# Patient Record
Sex: Female | Born: 1935 | Race: White | Hispanic: No | Marital: Single | Smoking: Never smoker
Health system: Southern US, Community
[De-identification: ages and names within clinical notes are randomized; demographics above are authoritative.]

## PROBLEM LIST (undated history)

## (undated) DIAGNOSIS — I10 Essential (primary) hypertension: Secondary | ICD-10-CM

## (undated) DIAGNOSIS — C801 Malignant (primary) neoplasm, unspecified: Secondary | ICD-10-CM

## (undated) DIAGNOSIS — Q211 Atrial septal defect: Secondary | ICD-10-CM

## (undated) DIAGNOSIS — J449 Chronic obstructive pulmonary disease, unspecified: Secondary | ICD-10-CM

## (undated) DIAGNOSIS — Q2112 Patent foramen ovale: Secondary | ICD-10-CM

## (undated) DIAGNOSIS — L570 Actinic keratosis: Secondary | ICD-10-CM

## (undated) DIAGNOSIS — I509 Heart failure, unspecified: Secondary | ICD-10-CM

## (undated) DIAGNOSIS — Z8489 Family history of other specified conditions: Secondary | ICD-10-CM

## (undated) DIAGNOSIS — Z452 Encounter for adjustment and management of vascular access device: Secondary | ICD-10-CM

## (undated) HISTORY — PX: ABDOMINAL HYSTERECTOMY: SHX81

## (undated) HISTORY — PX: JOINT REPLACEMENT: SHX530

## (undated) HISTORY — DX: Actinic keratosis: L57.0

---

## 2003-10-08 ENCOUNTER — Other Ambulatory Visit: Payer: Self-pay

## 2003-11-23 ENCOUNTER — Other Ambulatory Visit: Payer: Self-pay

## 2003-11-24 ENCOUNTER — Other Ambulatory Visit: Payer: Self-pay

## 2004-05-28 ENCOUNTER — Other Ambulatory Visit: Payer: Self-pay

## 2005-07-23 ENCOUNTER — Ambulatory Visit: Payer: Self-pay | Admitting: Ophthalmology

## 2005-07-29 ENCOUNTER — Ambulatory Visit: Payer: Self-pay | Admitting: Ophthalmology

## 2005-12-23 ENCOUNTER — Emergency Department: Payer: Self-pay | Admitting: Emergency Medicine

## 2006-01-04 ENCOUNTER — Inpatient Hospital Stay: Payer: Self-pay | Admitting: Internal Medicine

## 2006-01-04 ENCOUNTER — Other Ambulatory Visit: Payer: Self-pay

## 2006-05-27 ENCOUNTER — Ambulatory Visit: Payer: Self-pay | Admitting: Specialist

## 2006-06-03 ENCOUNTER — Inpatient Hospital Stay: Payer: Self-pay | Admitting: Specialist

## 2006-06-03 ENCOUNTER — Other Ambulatory Visit: Payer: Self-pay

## 2006-06-26 ENCOUNTER — Emergency Department: Payer: Self-pay | Admitting: Emergency Medicine

## 2006-06-26 ENCOUNTER — Other Ambulatory Visit: Payer: Self-pay

## 2006-09-29 ENCOUNTER — Emergency Department: Payer: Self-pay | Admitting: Emergency Medicine

## 2006-11-03 ENCOUNTER — Ambulatory Visit: Payer: Self-pay | Admitting: Specialist

## 2007-03-12 ENCOUNTER — Other Ambulatory Visit: Payer: Self-pay

## 2007-03-12 ENCOUNTER — Emergency Department: Payer: Self-pay | Admitting: Emergency Medicine

## 2007-12-04 ENCOUNTER — Other Ambulatory Visit: Payer: Self-pay

## 2007-12-04 ENCOUNTER — Observation Stay: Payer: Self-pay | Admitting: Internal Medicine

## 2008-07-01 ENCOUNTER — Emergency Department: Payer: Self-pay | Admitting: Emergency Medicine

## 2009-03-07 ENCOUNTER — Ambulatory Visit: Payer: Self-pay | Admitting: Specialist

## 2009-04-22 ENCOUNTER — Ambulatory Visit: Payer: Self-pay | Admitting: General Practice

## 2009-06-17 ENCOUNTER — Ambulatory Visit: Payer: Self-pay | Admitting: General Practice

## 2009-07-03 ENCOUNTER — Inpatient Hospital Stay: Payer: Self-pay | Admitting: General Practice

## 2009-07-09 ENCOUNTER — Encounter: Payer: Self-pay | Admitting: Internal Medicine

## 2009-07-15 ENCOUNTER — Encounter: Payer: Self-pay | Admitting: Internal Medicine

## 2010-07-15 ENCOUNTER — Ambulatory Visit: Payer: Self-pay | Admitting: General Practice

## 2010-07-30 ENCOUNTER — Inpatient Hospital Stay: Payer: Self-pay | Admitting: General Practice

## 2010-10-02 ENCOUNTER — Ambulatory Visit: Payer: Self-pay | Admitting: Gastroenterology

## 2010-10-04 LAB — PATHOLOGY REPORT

## 2010-12-09 ENCOUNTER — Ambulatory Visit: Payer: Self-pay | Admitting: General Practice

## 2010-12-22 ENCOUNTER — Inpatient Hospital Stay: Payer: Self-pay | Admitting: General Practice

## 2010-12-22 DIAGNOSIS — I10 Essential (primary) hypertension: Secondary | ICD-10-CM

## 2010-12-26 ENCOUNTER — Encounter: Payer: Self-pay | Admitting: Internal Medicine

## 2011-01-13 ENCOUNTER — Encounter: Payer: Self-pay | Admitting: Internal Medicine

## 2011-09-10 ENCOUNTER — Ambulatory Visit: Payer: Self-pay | Admitting: Sports Medicine

## 2011-11-25 ENCOUNTER — Inpatient Hospital Stay: Payer: Self-pay | Admitting: Internal Medicine

## 2011-11-25 LAB — CBC
HCT: 38.2 % (ref 35.0–47.0)
MCH: 30 pg (ref 26.0–34.0)
MCHC: 32.4 g/dL (ref 32.0–36.0)
MCV: 93 fL (ref 80–100)
Platelet: 165 10*3/uL (ref 150–440)
RBC: 4.13 10*6/uL (ref 3.80–5.20)
RDW: 13.2 % (ref 11.5–14.5)
WBC: 7.1 10*3/uL (ref 3.6–11.0)

## 2011-11-25 LAB — COMPREHENSIVE METABOLIC PANEL
Alkaline Phosphatase: 83 U/L (ref 50–136)
Anion Gap: 11 (ref 7–16)
BUN: 13 mg/dL (ref 7–18)
Bilirubin,Total: 0.3 mg/dL (ref 0.2–1.0)
Calcium, Total: 8.7 mg/dL (ref 8.5–10.1)
EGFR (African American): >60
EGFR (Non-African Amer.): >60
Glucose: 118 mg/dL — ABNORMAL HIGH (ref 65–99)
Osmolality: 269 (ref 275–301)
Potassium: 3.9 mmol/L (ref 3.5–5.1)
SGOT(AST): 28 U/L (ref 15–37)
SGPT (ALT): 27 U/L

## 2011-11-25 LAB — PRO B NATRIURETIC PEPTIDE: B-Type Natriuretic Peptide: 140 pg/mL (ref 0–450)

## 2011-11-26 LAB — CBC WITH DIFFERENTIAL/PLATELET
Basophil #: 0 10*3/uL (ref 0.0–0.1)
Basophil %: 0.2 %
Eosinophil %: 0 %
Lymphocyte #: 0.6 10*3/uL — ABNORMAL LOW (ref 1.0–3.6)
Lymphocyte %: 6.5 %
MCHC: 33 g/dL (ref 32.0–36.0)
MCV: 93 fL (ref 80–100)
Monocyte %: 4.6 %
Neutrophil #: 8.7 10*3/uL — ABNORMAL HIGH (ref 1.4–6.5)
Platelet: 201 10*3/uL (ref 150–440)
RBC: 4.17 10*6/uL (ref 3.80–5.20)
RDW: 14 % (ref 11.5–14.5)
WBC: 9.8 10*3/uL (ref 3.6–11.0)

## 2011-11-26 LAB — BASIC METABOLIC PANEL
BUN: 18 mg/dL (ref 7–18)
Calcium, Total: 8.6 mg/dL (ref 8.5–10.1)
Creatinine: 1.03 mg/dL (ref 0.60–1.30)
EGFR (African American): >60
EGFR (Non-African Amer.): 54 — ABNORMAL LOW
Glucose: 191 mg/dL — ABNORMAL HIGH (ref 65–99)
Osmolality: 277 (ref 275–301)
Sodium: 135 mmol/L — ABNORMAL LOW (ref 136–145)

## 2011-11-27 ENCOUNTER — Inpatient Hospital Stay: Payer: Self-pay | Admitting: Student

## 2011-11-27 LAB — CBC
HCT: 37 % (ref 35.0–47.0)
HGB: 12.5 g/dL (ref 12.0–16.0)
MCH: 31.3 pg (ref 26.0–34.0)
RBC: 4 10*6/uL (ref 3.80–5.20)
RDW: 14.1 % (ref 11.5–14.5)
WBC: 14.6 10*3/uL — ABNORMAL HIGH (ref 3.6–11.0)

## 2011-11-27 LAB — BASIC METABOLIC PANEL
Anion Gap: 11 (ref 7–16)
Calcium, Total: 8.4 mg/dL — ABNORMAL LOW (ref 8.5–10.1)
Chloride: 95 mmol/L — ABNORMAL LOW (ref 98–107)
Co2: 26 mmol/L (ref 21–32)
EGFR (African American): >60
EGFR (Non-African Amer.): 54 — ABNORMAL LOW
Glucose: 122 mg/dL — ABNORMAL HIGH (ref 65–99)
Osmolality: 271 (ref 275–301)

## 2011-11-27 LAB — TROPONIN I: Troponin-I: <0.02 ng/mL

## 2011-11-28 LAB — CBC WITH DIFFERENTIAL/PLATELET
Basophil #: 0 x10 3/mm 3
Basophil %: 0.1 %
Eosinophil #: 0 x10 3/mm 3
Eosinophil %: 0.1 %
HCT: 37.8 %
HGB: 12.4 g/dL
Lymphocyte %: 8.7 %
Lymphs Abs: 1 x10 3/mm 3
MCH: 30.9 pg
MCHC: 32.8 g/dL
MCV: 94 fL
Monocyte #: 0.9 x10 3/mm 3 — ABNORMAL HIGH
Monocyte %: 7.2 %
Neutrophil #: 10 x10 3/mm 3 — ABNORMAL HIGH
Neutrophil %: 83.9 %
Platelet: 198 x10 3/mm 3
RBC: 4.02 X10 6/mm 3
RDW: 14.1 %
WBC: 11.9 x10 3/mm 3 — ABNORMAL HIGH

## 2011-11-28 LAB — COMPREHENSIVE METABOLIC PANEL
Albumin: 2.7 g/dL — ABNORMAL LOW (ref 3.4–5.0)
Anion Gap: 9 (ref 7–16)
Calcium, Total: 8.5 mg/dL (ref 8.5–10.1)
Chloride: 97 mmol/L — ABNORMAL LOW (ref 98–107)
Co2: 29 mmol/L (ref 21–32)
EGFR (Non-African Amer.): 51 — ABNORMAL LOW
Glucose: 90 mg/dL (ref 65–99)
Osmolality: 274 (ref 275–301)
Potassium: 3.5 mmol/L (ref 3.5–5.1)
SGOT(AST): 24 U/L (ref 15–37)
SGPT (ALT): 25 U/L

## 2011-11-30 LAB — HEMOGLOBIN: HGB: 11.7 g/dL — ABNORMAL LOW (ref 12.0–16.0)

## 2011-12-01 LAB — CBC WITH DIFFERENTIAL/PLATELET
Eosinophil #: 0 10*3/uL (ref 0.0–0.7)
Eosinophil %: 0 %
HCT: 33.4 % — ABNORMAL LOW (ref 35.0–47.0)
Lymphocyte #: 0.9 10*3/uL — ABNORMAL LOW (ref 1.0–3.6)
MCH: 30.9 pg (ref 26.0–34.0)
MCV: 93 fL (ref 80–100)
Monocyte %: 6.6 %
Neutrophil #: 9.3 10*3/uL — ABNORMAL HIGH (ref 1.4–6.5)
Neutrophil %: 85.5 %
RBC: 3.6 10*6/uL — ABNORMAL LOW (ref 3.80–5.20)
RDW: 14.1 % (ref 11.5–14.5)
WBC: 10.9 10*3/uL (ref 3.6–11.0)

## 2011-12-02 LAB — CBC WITH DIFFERENTIAL/PLATELET
Basophil %: 0 %
Eosinophil #: 0 10*3/uL (ref 0.0–0.7)
HCT: 35.6 % (ref 35.0–47.0)
Lymphocyte #: 1.1 10*3/uL (ref 1.0–3.6)
MCH: 30.3 pg (ref 26.0–34.0)
MCHC: 32.7 g/dL (ref 32.0–36.0)
MCV: 93 fL (ref 80–100)
Monocyte #: 0.9 10*3/uL — ABNORMAL HIGH (ref 0.0–0.7)
Neutrophil #: 11 10*3/uL — ABNORMAL HIGH (ref 1.4–6.5)
Platelet: 226 10*3/uL (ref 150–440)
RBC: 3.84 10*6/uL (ref 3.80–5.20)
RDW: 14.1 % (ref 11.5–14.5)

## 2011-12-02 LAB — OCCULT BLOOD X 1 CARD TO LAB, STOOL: Occult Blood, Feces: NEGATIVE

## 2011-12-02 LAB — CLOSTRIDIUM DIFFICILE BY PCR

## 2011-12-03 LAB — CULTURE, BLOOD (SINGLE)

## 2011-12-04 LAB — STOOL CULTURE

## 2013-11-30 DIAGNOSIS — IMO0002 Reserved for concepts with insufficient information to code with codable children: Secondary | ICD-10-CM | POA: Insufficient documentation

## 2013-11-30 DIAGNOSIS — M545 Low back pain, unspecified: Secondary | ICD-10-CM | POA: Insufficient documentation

## 2013-11-30 DIAGNOSIS — M1610 Unilateral primary osteoarthritis, unspecified hip: Secondary | ICD-10-CM | POA: Insufficient documentation

## 2014-01-14 ENCOUNTER — Observation Stay: Payer: Self-pay | Admitting: Internal Medicine

## 2014-01-14 LAB — PROTIME-INR
INR: 1
Prothrombin Time: 13.5 secs (ref 11.5–14.7)

## 2014-01-14 LAB — PRO B NATRIURETIC PEPTIDE: B-Type Natriuretic Peptide: 89 pg/mL (ref 0–450)

## 2014-01-14 LAB — BASIC METABOLIC PANEL
Anion Gap: 10 (ref 7–16)
BUN: 17 mg/dL (ref 7–18)
CREATININE: 1.13 mg/dL (ref 0.60–1.30)
Calcium, Total: 8.9 mg/dL (ref 8.5–10.1)
Chloride: 102 mmol/L (ref 98–107)
Co2: 25 mmol/L (ref 21–32)
EGFR (African American): 51 — ABNORMAL LOW
EGFR (Non-African Amer.): 44 — ABNORMAL LOW
Glucose: 145 mg/dL — ABNORMAL HIGH (ref 65–99)
OSMOLALITY: 278 (ref 275–301)
Potassium: 3.5 mmol/L (ref 3.5–5.1)
Sodium: 137 mmol/L (ref 136–145)

## 2014-01-14 LAB — CK TOTAL AND CKMB (NOT AT ARMC)
CK, Total: 92 U/L
CK-MB: 0.7 ng/mL (ref 0.5–3.6)

## 2014-01-14 LAB — CBC
HCT: 37.1 % (ref 35.0–47.0)
HGB: 12.1 g/dL (ref 12.0–16.0)
MCH: 27.7 pg (ref 26.0–34.0)
MCHC: 32.7 g/dL (ref 32.0–36.0)
MCV: 85 fL (ref 80–100)
Platelet: 183 10*3/uL (ref 150–440)
RBC: 4.37 10*6/uL (ref 3.80–5.20)
RDW: 15.3 % — ABNORMAL HIGH (ref 11.5–14.5)
WBC: 6.9 10*3/uL (ref 3.6–11.0)

## 2014-01-14 LAB — TROPONIN I: Troponin-I: <0.02 ng/mL

## 2014-01-15 LAB — BASIC METABOLIC PANEL
ANION GAP: 11 (ref 7–16)
BUN: 17 mg/dL (ref 7–18)
CALCIUM: 8.8 mg/dL (ref 8.5–10.1)
Chloride: 100 mmol/L (ref 98–107)
Co2: 23 mmol/L (ref 21–32)
Creatinine: 1.16 mg/dL (ref 0.60–1.30)
EGFR (African American): 49 — ABNORMAL LOW
EGFR (Non-African Amer.): 43 — ABNORMAL LOW
Glucose: 256 mg/dL — ABNORMAL HIGH (ref 65–99)
OSMOLALITY: 279 (ref 275–301)
POTASSIUM: 3.6 mmol/L (ref 3.5–5.1)
Sodium: 134 mmol/L — ABNORMAL LOW (ref 136–145)

## 2014-01-17 ENCOUNTER — Inpatient Hospital Stay: Payer: Self-pay | Admitting: Internal Medicine

## 2014-01-17 LAB — COMPREHENSIVE METABOLIC PANEL
ALBUMIN: 3.3 g/dL — AB (ref 3.4–5.0)
ANION GAP: 9 (ref 7–16)
AST: 98 U/L — AB (ref 15–37)
Alkaline Phosphatase: 90 U/L
BUN: 31 mg/dL — ABNORMAL HIGH (ref 7–18)
Bilirubin,Total: 0.3 mg/dL (ref 0.2–1.0)
CHLORIDE: 103 mmol/L (ref 98–107)
CREATININE: 1.14 mg/dL (ref 0.60–1.30)
Calcium, Total: 9 mg/dL (ref 8.5–10.1)
Co2: 23 mmol/L (ref 21–32)
EGFR (African American): 50 — ABNORMAL LOW
EGFR (Non-African Amer.): 44 — ABNORMAL LOW
Glucose: 185 mg/dL — ABNORMAL HIGH (ref 65–99)
OSMOLALITY: 281 (ref 275–301)
POTASSIUM: 4.2 mmol/L (ref 3.5–5.1)
SGPT (ALT): 45 U/L (ref 12–78)
Sodium: 135 mmol/L — ABNORMAL LOW (ref 136–145)
TOTAL PROTEIN: 7.1 g/dL (ref 6.4–8.2)

## 2014-01-17 LAB — CK: CK, Total: 118 U/L

## 2014-01-17 LAB — CBC
HCT: 38.6 % (ref 35.0–47.0)
HGB: 12.6 g/dL (ref 12.0–16.0)
MCH: 27.6 pg (ref 26.0–34.0)
MCHC: 32.7 g/dL (ref 32.0–36.0)
MCV: 85 fL (ref 80–100)
PLATELETS: 239 10*3/uL (ref 150–440)
RBC: 4.56 10*6/uL (ref 3.80–5.20)
RDW: 15.5 % — ABNORMAL HIGH (ref 11.5–14.5)
WBC: 8.8 10*3/uL (ref 3.6–11.0)

## 2014-01-17 LAB — URINALYSIS, COMPLETE
BACTERIA: NONE SEEN
Bilirubin,UR: NEGATIVE
Blood: NEGATIVE
GLUCOSE, UR: NEGATIVE mg/dL (ref 0–75)
Ketone: NEGATIVE
Leukocyte Esterase: NEGATIVE
Nitrite: NEGATIVE
PH: 6 (ref 4.5–8.0)
PROTEIN: NEGATIVE
RBC, UR: NONE SEEN /HPF (ref 0–5)
Specific Gravity: 1.016 (ref 1.003–1.030)
Squamous Epithelial: <1

## 2014-01-18 LAB — CBC WITH DIFFERENTIAL/PLATELET
Basophil #: 0 10*3/uL (ref 0.0–0.1)
Basophil %: 0.2 %
EOS PCT: 0.2 %
Eosinophil #: 0 10*3/uL (ref 0.0–0.7)
HCT: 36.6 % (ref 35.0–47.0)
HGB: 11.7 g/dL — ABNORMAL LOW (ref 12.0–16.0)
LYMPHS PCT: 19.8 %
Lymphocyte #: 1.6 10*3/uL (ref 1.0–3.6)
MCH: 26.7 pg (ref 26.0–34.0)
MCHC: 31.9 g/dL — AB (ref 32.0–36.0)
MCV: 84 fL (ref 80–100)
MONO ABS: 0.7 x10 3/mm (ref 0.2–0.9)
Monocyte %: 9.5 %
Neutrophil #: 5.5 10*3/uL (ref 1.4–6.5)
Neutrophil %: 70.3 %
Platelet: 196 10*3/uL (ref 150–440)
RBC: 4.37 10*6/uL (ref 3.80–5.20)
RDW: 15.5 % — AB (ref 11.5–14.5)
WBC: 7.9 10*3/uL (ref 3.6–11.0)

## 2014-01-18 LAB — COMPREHENSIVE METABOLIC PANEL
ALK PHOS: 69 U/L
ANION GAP: 7 (ref 7–16)
AST: 64 U/L — AB (ref 15–37)
Albumin: 2.9 g/dL — ABNORMAL LOW (ref 3.4–5.0)
BILIRUBIN TOTAL: 0.4 mg/dL (ref 0.2–1.0)
BUN: 22 mg/dL — ABNORMAL HIGH (ref 7–18)
CHLORIDE: 101 mmol/L (ref 98–107)
CO2: 27 mmol/L (ref 21–32)
Calcium, Total: 8.6 mg/dL (ref 8.5–10.1)
Creatinine: 1.02 mg/dL (ref 0.60–1.30)
EGFR (African American): 58 — ABNORMAL LOW
EGFR (Non-African Amer.): 50 — ABNORMAL LOW
GLUCOSE: 114 mg/dL — AB (ref 65–99)
Osmolality: 274 (ref 275–301)
POTASSIUM: 3.3 mmol/L — AB (ref 3.5–5.1)
SGPT (ALT): 40 U/L (ref 12–78)
SODIUM: 135 mmol/L — AB (ref 136–145)
Total Protein: 6.6 g/dL (ref 6.4–8.2)

## 2014-01-18 LAB — HEMOGLOBIN: HGB: 12.2 g/dL (ref 12.0–16.0)

## 2014-01-19 LAB — CULTURE, BLOOD (SINGLE)

## 2014-02-26 DIAGNOSIS — R0602 Shortness of breath: Secondary | ICD-10-CM | POA: Insufficient documentation

## 2014-07-02 DIAGNOSIS — I5032 Chronic diastolic (congestive) heart failure: Secondary | ICD-10-CM | POA: Insufficient documentation

## 2014-12-31 DIAGNOSIS — E782 Mixed hyperlipidemia: Secondary | ICD-10-CM | POA: Insufficient documentation

## 2015-01-03 ENCOUNTER — Emergency Department
Admit: 2015-01-03 | Disposition: A | Discharge status: Home / Self Care | Payer: Self-pay | Admitting: Emergency Medicine

## 2015-01-03 LAB — BASIC METABOLIC PANEL
Anion Gap: 5 — ABNORMAL LOW (ref 7–16)
BUN: 15 mg/dL
CALCIUM: 8.2 mg/dL — AB
CHLORIDE: 105 mmol/L
Co2: 24 mmol/L
Creatinine: 0.73 mg/dL
EGFR (African American): >60
EGFR (Non-African Amer.): >60
GLUCOSE: 146 mg/dL — AB
Potassium: 3.9 mmol/L
Sodium: 134 mmol/L — ABNORMAL LOW

## 2015-01-03 LAB — CBC
HCT: 34.9 % — ABNORMAL LOW (ref 35.0–47.0)
HGB: 10.9 g/dL — ABNORMAL LOW (ref 12.0–16.0)
MCH: 24.5 pg — ABNORMAL LOW (ref 26.0–34.0)
MCHC: 31.3 g/dL — ABNORMAL LOW (ref 32.0–36.0)
MCV: 78 fL — ABNORMAL LOW (ref 80–100)
PLATELETS: 150 10*3/uL (ref 150–440)
RBC: 4.46 10*6/uL (ref 3.80–5.20)
RDW: 16.7 % — AB (ref 11.5–14.5)
WBC: 5 10*3/uL (ref 3.6–11.0)

## 2015-01-03 LAB — TROPONIN I: Troponin-I: <0.03 ng/mL

## 2015-01-05 NOTE — Discharge Summary (Signed)
PATIENT NAME:  Brittney Wood, KUNST MR#:  [id number] DATE OF BIRTH:  12-17-1924  DATE OF ADMISSION:  01/14/2014 DATE OF DISCHARGE:  01/15/2014  ADMITTING DIAGNOSIS: Chronic obstructive pulmonary disease exacerbation.   DISCHARGE DIAGNOSES:  1. Chronic obstructive pulmonary disease exacerbation.  2. Acute bronchitis.  3. Hyperglycemia, with steroids. 4. Hyponatremia with IV fluids.  5. History of hypertension, hyperlipidemia, pulmonary fibrosis, osteoporosis, osteoarthritis, diastolic congestive heart failure, chronic, stable.  6.   History of glaucoma.   DISCHARGE CONDITION: Stable.   DISCHARGE MEDICATIONS: The patient is to continue: 1. Advair Diskus 250/50 one puff twice daily, amlodipine 5 mg p.o. daily.  2. Travatan 0.004% ophthalmic solution 1 drop to each eye at bedtime. 3.  Tylenol 650 mg every 8 hours as needed.  4. HCTZ 12.5 mg p.o. daily.  5. Aspirin 81 mg at p.o. bedtime. 6. Calcium with vitamin D 1 tablet once daily. 7. Omeprazole 20 mg p.o. daily.  8. Simvastatin 10 mg p.o. at bedtime. 9. HCTZ losartan 12.5/50 mg  once daily.  10. Potassium chloride 20 mg once daily.  11. Prednisone taper 50 mg p.o. once on 01/16/2014, then taper  by 10  mg until stopped.  12. Tiotropium  1 capsule inhalation daily.  13. Combivent Respimat 100/20, 1 puff 4 times daily as needed.  14. Montelukast 10 mg p.o. daily.  15. Levofloxacin 750 mg every 48 hours, 7 days total.  16. Multivitamins once daily.   HOME OXYGEN: None.   DIET: Two grams salt, low-fat, low-cholesterol, carbohydrate -controlled diet, regular consistency.   ACTIVITY LIMITATIONS: As tolerated.    FOLLOWUP APPOINTMENT: With Dr. Raul Del in 1 week after discharge.    CONSULTANTS: Care management, social work.   RADIOLOGIC STUDIES: Chest x-ray, PA and lateral, 01/14/2014, revealed no acute cardiopulmonary disease, hyperexpanded lung suggesting COPD. Repeated chest x-ray, PA and lateral, 01/15/2014, revealed lower bilateral  lung volumes with scattered bilateral atelectasis.   HOSPITAL COURSE: The patient is an 81 year old Caucasian female with past medical history significant for history of MRSA and sputum, history of pulmonary fibrosis, who presented to the hospital with complaints of shortness of breath. Please refer to Dr. Doy Hutching' admission note on 01/14/2014. On arrival to the hospital, the patient's vital signs, temperature was 98.2, pulse was 84, respiration rate was 20, blood pressure 114/63, saturation was 98% on 2 liters of oxygen through nasal cannula. Physical exam revealed scattered wheezes and decreased bilateral breath sounds.  Patient's lab data done in the Emergency Room on admission showed elevation of glucose to 145; otherwise, BMP was unremarkable. The patient's cardiac enzymes x 4 were within normal limits. CBC was within normal limits with normal white blood cell count of 6.9, hemoglobin 12.1, platelet count 183,000. Coagulation panel was unremarkable. Blood cultures taken on the same day, 01/14/2014, showed no growth. EKG showed normal sinus rhythm at 91 beats per minute, normal axis, no significant changes were noted. Chest x-ray was significant for COPD. Patient was admitted to the hospital for further evaluation. She was started on broad-spectrum antibiotics, including Zyvox as well as Levaquin and IV steroids and SVNs. She was also continued on oxygen therapy, and by the evening on 01/14/2014 she significantly improved. She was seen by Dr. Wallene Huh who felt that patient may be able to be discharged home the next day. By 01/15/2014, she was weaned down to room air and her oxygen saturations remained stable at 92%- 94% on room air at rest. She was advised to continue Levaquin. We were not able discharge  her on Zyvox since no sputum cultures were available and patient improved significantly and very quickly on current medications.  Patient was advised to continue inhalation therapy with Advair Diskus as  well as tiotropium, Combivent, montelukast. She was advised to continue antibiotic therapy to complete antibiotic course. She was at rest, to taper prednisone, and follow up with her primary care physician, Dr. Raul Del, in the next few days after discharge. In regard to acute bronchitis, we were not able to get the sputum cultures, and since she improved significantly with current medications, a short course, we felt that the patient would benefit from Levaquin alone.  Patient is to be reassessed by her primary care physician, Dr. Raul Del, and decisions will be made depending on her sputum cultures as well as overall well-being in regard to initiation on Zyvox or not as outpatient. The patient became slightly hyponatremic with IV fluids; however, IV fluids were discontinued. It is recommended to follow the patient's sodium level as outpatient to make sure that this stabilizes. For history of hypertension, hyperlipidemia, pulmonary fibrosis, osteoporosis, osteoarthritis, diastolic CHF, the patient is to continue her usual outpatient management. On the day of discharge, she felt satisfactory, did not complain of any significant discomfort. Her vitals were stable with temperature of 97.8, pulse 99, respiration rate was 18, blood pressure 107/62, saturation 93%-94% on room air at rest.   TIME SPENT: 40 minutes.    ____________________________ Theodoro Grist, MD rv:dd D: 01/15/2014 16:43:00 ET T: 01/16/2014 03:11:06 ET JOB#: [id number]  cc: Theodoro Grist, MD, <Dictator> Dr. Dionne Bucy MD ELECTRONICALLY SIGNED 02/01/2014 10:11

## 2015-01-05 NOTE — Consult Note (Signed)
Pt seen and examined. See Dawn Harrison's notes. Last rectal bleeding episode was yesterday AM. On clear liquid diet. No further bleeding. No abd pain. Hungry. Wants solids. Had descending colon diverticuli, hemorrhoids, and benign sigmoid polyp removed in 09/2010. May have diverticular bleed. Stable now. Ordered low residue diet. If tolerates solids without bleeding, then ok for discharge. If patient rebleeds later, then schedule colonoscopy as outpt. Will check on patient tomorrow AM. Thanks.  Electronic Signatures: Verdie Shire (MD)  (Signed on 07-May-15 13:43)  Authored  Last Updated: 07-May-15 13:43 by Verdie Shire (MD)

## 2015-01-05 NOTE — Consult Note (Signed)
PATIENT NAME:  Brittney Wood, Brittney Wood MR#:  [id number] DATE OF BIRTH:  1924-12-11  DATE OF CONSULTATION:  01/18/2014  REFERRING PHYSICIAN:  Theodoro Grist, MD CONSULTING PHYSICIAN:  Verdie Shire, MD / Payton Emerald, NP  PRIMARY CARE PHYSICIAN: Wallene Huh, MD  REASON FOR CONSULTATION: Rectal bleeding.   HISTORY OF PRESENT ILLNESS: Brittney Wood is an 81 year old Caucasian female who has a significant past medical history of COPD with a recent admission. The patient was admitted to [facility] 01/14/2014 to 01/15/2014. She presented back to the Emergency Room on 01/17/2014 for the chief complaint of rectal bleeding. According to the patient, 2 days ago she experienced rectal bleeding, a large amount of blood noted in 3 Depends. According to the patient, no passage of clots. She has had no further episodes of bleeding since presenting to the Emergency Room or being admitted. At the time she was noticing blood in toilet water. Most the time her bowels move on average every day. Occasionally she does state that she will have diarrhea. No abdominal pain. No nausea or vomiting. No fevers. No heartburn. The patient did have a colonoscopy that was performed by Dr. Verdie Shire in 2012 which was incomplete. A sessile polyp was found in the sigmoid colon. Scope was only able to be advanced to the hepatic flexure. Documented that colonoscopy was technically difficult and complex due to significant looping. Non-thrombosed external hemorrhoids and diverticulosis also noted. Biopsies were obtained at that time. Random biopsies revealed to be negative for colitis. A hyperplastic polyp was noted. The patient states she has never had bleeding like this before in the past. It is noted on H and P that the patient had a rectal examination done by Emergency Room physician, Dr. Cinda Quest, who reported external hemorrhoids as well as blood in mucus being noted on glove.   PAST MEDICAL HISTORY: Significant admission for COPD exacerbation. The patient currently  receiving antibiotic therapy at this time. Hypertension, hyperlipidemia, pulmonary fibrosis, osteoporosis, MRSA, osteoarthritis, diastolic congestive heart failure.  PAST SURGICAL HISTORY: Left knee surgery, hysterectomy, and then colonoscopy incomplete in 2012.   HOME MEDICATIONS: According to the patient's medical record. The patient is on Advair Diskus 250/50 one puff twice a day, amlodipine 5 mg daily, Travatan 0.004% ophthalmic solution 1 drop each eye at bedtime, Tylenol 650 mg every 6 hours as needed, hydrochlorothiazide 12.5 mg daily, aspirin 81 mg at bedtime, calcium with vitamin D once a day, omeprazole 20 mg once a day, simvastatin 10 mg a day at bedtime, HCTZ/losartan 12.5 mg/50 mg once a day, potassium chloride 20 mEq daily, prednisone taper started at 60 mg on 01/16/2014 (she is to taper x 10 mg daily), tiotropium 1 capsule inhalation once a day, Combivent Respimat 100/20 one puff 4 times daily as needed, montelukast 10 mg once a day, Levaquin 750 mg every 48 hours.   ALLERGIES: MORPHINE.   SOCIAL HISTORY: No tobacco. No alcohol use.  FAMILY HISTORY: Significant for coronary artery disease, hypertension and stroke.   REVIEW OF SYSTEMS:  CONSTITUTIONAL: Positive for weakness, rectal bleeding, history of glaucoma.  EARS, NOSE AND THROAT: Denies any tinnitus, epistaxis, sinus pain, dentures, difficulty swallowing.  RESPIRATORY: No coughing. Wheezing at this time, but noted recent admission for exacerbation of COPD. CARDIOVASCULAR: No chest pain, heart palpitations.  GASTROINTESTINAL: See HPI. GENITOURINARY: No dysuria or hematuria.  ENDOCRINE: No polydipsia, nocturia. HEME AND LYMPH: Denies easy bruising and bleeding.  SKIN: No rashes or lesions.  MUSCULOSKELETAL: History of arthritis. No gout or swelling.  NEUROLOGIC:  No history of CVA or TIA. PSYCHIATRIC: No depression or anxiety.   PHYSICAL EXAMINATION: VITAL SIGNS: Temperature 98.3, pulse 80, respirations 18, blood pressure  99/61 with a pulse ox of 91%. HEENT: Normocephalic, atraumatic. Pupils equal and reactive to light. Conjunctivae clear. Sclerae anicteric.  NECK: Supple. Trachea midline. No lymphadenopathy or thyromegaly.  PULMONARY: Symmetric rise and fall of chest anteriorly. No adventitious sounds.  CARDIOVASCULAR: Regular rhythm, S1 and S2. No murmurs or gallops.  ABDOMEN: Soft, nondistended. Bowel sounds in 4 quadrants. No bruits. No masses.  RECTAL: Deferred.  MUSCULOSKELETAL: Moving all 4 extremities. No contractures. No clubbing.  EXTREMITIES: No edema.  PSYCHIATRIC: Alert and oriented x4. Memory grossly intact. Appropriate affect and mood.   DIAGNOSTIC DATA: All laboratory studies, as well as diagnostic findings, were reviewed by myself on her most recent admission. Hemoglobin has minimally dropped. It was 12.6 on admission. Trending it went to 12.2 and then 11.7. Hematocrit has remained within normal limits. Albumin 3.3 and has dropped to 2.9 during this admission. AST was slightly elevated at 98 and has dropped to 64. Antibody screen is negative with an ABO group plus Rh type as O negative. Urinalysis essentially unremarkable.   IMPRESSION:  Rectal bleeding. Known history of diverticulosis as well as external hemorrhoids, possible underlying etiological cause. Rectal bleeding has resolved at this time. Recent exacerbation of chronic obstructive pulmonary disease.   PLAN: The patient's presentation was discussed with Dr. Verdie Shire. At this time do not recommend proceeding forward with diagnostic colonoscopy. Most recent one was attempted in 2012 and due to excessive looping at that time it was very difficult to complete procedure. At this time recommend monitoring. If bleeding should recur, will need to readdress in how to proceed forward for evaluation. Do recommend advancing her diet. We will continue to monitor.   These services provided by Payton Emerald, MS, APRN, [facility], FNP under collaborative  agreement with Verdie Shire, MD.  ____________________________ Payton Emerald, NP dsh:sb D: 01/18/2014 14:24:11 ET T: 01/18/2014 17:16:38 ET JOB#: [id number]  cc: Payton Emerald, NP, <Dictator> Payton Emerald MD ELECTRONICALLY SIGNED 01/24/2014 16:47

## 2015-01-05 NOTE — Discharge Summary (Signed)
PATIENT NAMEBRAYLI, Brittney Wood MR#:  [id number] DATE OF BIRTH:  1924/12/15  DATE OF ADMISSION:  01/17/2014 DATE OF DISCHARGE:  01/18/2014  FINAL DIAGNOSES:  1.  Rectal bleeding, likely diverticular in nature.  2.  Hyponatremia.  3.  Chronic obstructive pulmonary disease exacerbation.  4.  Elevated liver function tests.  5.  A history of congestive heart failure. No signs on this hospital stay.   MEDICATIONS ON DISCHARGE:  Include Advair Diskus 250/50 one inhalation twice a day, amlodipine 5 mg daily, Travatan 0.004% ophthalmic solution 1 drop each eye at bedtime, acetaminophen 650 mg every 8 hours as needed for arthritis, calcium and vitamin D 1 tablet daily in the morning, omeprazole 20 mg daily, Spiriva 18 mcg 1 inhalation daily, Combivent Respimat CFC 1 puff 4 times a day as needed for shortness of breath, Singulair 10 mg at bedtime, multivitamin 1 tablet daily, Levaquin 750 mg every 48 hours, finish up course from before. Stop taking hydrochlorothiazide, aspirin, potassium, simvastatin, losartan and prednisone.   DIET:  Low sodium diet. Eat light for the first meal of regular consistency.   ACTIVITY:  As tolerated.   FOLLOW UP:  He can follow up with Gastroenterology, Dr. Candace Cruise, if needed. Dr. Raul Del 1 to 2 weeks.   HOSPITAL COURSE:  The patient was admitted 01/17/2014 and discharged 01/18/2014. Came in with rectal bleeding, admitted, serial hemoglobins were checked. A GI consultation was obtained.   LABORATORY AND RADIOLOGICAL DATA DURING THE HOSPITAL COURSE: Included initial hemoglobin of 12.6, hemoglobin upon discharge 11.7. Sodium on admission 135, upon discharge 135.    HOSPITAL COURSE PER PROBLEM LIST:  1.  For the patient's a rectal bleeding, likely diverticular in nature. Hemoglobin remained stable.  Dr. Candace Cruise, Gastroenterology, did not want to do any procedures at this time. The bleeding had stopped. The patient was deemed stable to go home. The patient tolerated diet prior to going  home.  2.  Hyponatremia. Hydrochlorothiazide was stopped. Recheck sodium as outpatient.  3.  Chronic obstructive pulmonary disease exacerbation. Was recently in the hospital for this. Lungs were clear upon discharge. Stop the prednisone taper, finish up the Levaquin that she had at home.  4.  Elevated liver function tests:  AST was elevated at 98. It did come down to 64. I held the patient's simvastatin. Recommend checking as outpatient. If still elevated can check hepatitis profiles.  5.  A history of congestive heart failure and no signs on this hospital stay.   TIME SPENT ON DISCHARGE:  35 minutes.  ____________________________ Tana Conch. Leslye Peer, MD rjw:jm D: 01/19/2014 15:13:56 ET T: 01/19/2014 16:57:49 ET JOB#: [id number]  cc: Tana Conch. Leslye Peer, MD, <Dictator> Lupita Dawn. Candace Cruise, MD Marisue Brooklyn MD ELECTRONICALLY SIGNED 01/21/2014 14:47

## 2015-01-05 NOTE — H&P (Signed)
PATIENT NAME:  Brittney Wood, Brittney Wood MR#:  [id number] DATE OF BIRTH:  1925-06-24  DATE OF ADMISSION:  01/14/2014  REFERRING PHYSICIAN: Dr. Jacqualine Code  FAMILY PHYSICIAN: Dr. Raul Del  REASON FOR ADMISSION: Shortness of breath.   HISTORY OF PRESENT ILLNESS: The patient is an 81 year old female with a significant history of COPD, MRSA, osteoporosis and osteoarthritis. Also pulmonary fibrosis. Presents to the Emergency Room with worsening shortness of breath, where she was found to be mildly hypoxic, with sats in the 90 range on room air. In the Emergency Room, the patient was given three SVNs and placed on oxygen, with only minimal improvement of her symptoms. She is now admitted for further evaluation. She denies fevers. No nausea, vomiting, or diarrhea. No chest pain.   PAST MEDICAL HISTORY: 1.  COPD.   2.  Pulmonary fibrosis.  3.  History of MRSA.   4.  Osteoporosis.  5.  Osteoarthritis.  6.  History of diastolic congestive heart failure.  7.  Benign hypertension.  8.  Hyperlipidemia.  9.  Status post left knee surgery.  10.  Status post hysterectomy.   MEDICATIONS: 1.  Zyvox 600 mg p.o. b.i.d.  2.  Zocor 40 mg p.o. at bedtime.  3.  Prednisone taper as directed.  4.  Klor-Con 20 mEq p.o. daily. 5.  Omeprazole 20 mg p.o. daily.  6.  DuoNeb SVN q. 6 hours p.r.n.  7.  Micardis HCT 40/12.5 mg 1 p.o. daily.  8.  Levaquin 500 mg p.o. daily.  9.  Aspirin 81 mg p.o. daily.  10.  Norvasc 5 mg p.o. daily.  11.  Advair 250/50, 1 puff b.i.d.   ALLERGIES: MORPHINE.   SOCIAL HISTORY: Negative for alcohol or tobacco abuse.   FAMILY HISTORY: Positive for coronary artery disease, hypertension and stroke.   REVIEW OF SYSTEMS:    CONSTITUTIONAL: No fever or change in weight.  EYES: No blurred or double vision. No glaucoma.  ENT: No tinnitus or hearing loss. No nasal discharge or bleeding. No difficulty swallowing.  RESPIRATORY: The patient has had cough productive of white sputum. Denies hemoptysis. No  painful respiration.  CARDIOVASCULAR: Denies chest pain or orthopnea. No palpitations or syncope.  GASTROINTESTINAL: No nausea, vomiting, or diarrhea. No abdominal pain. No change in bowel habits.  GENITOURINARY: No dysuria or hematuria. No incontinence.  ENDOCRINE: No polyuria or polydipsia. No heat or cold intolerance.  HEMATOLOGIC: The patient denies anemia, easy bruising, or bleeding.  LYMPHATIC: No swollen glands.  MUSCULOSKELETAL: The patient denies pain in her neck, back, shoulders, knees, hips. No gout.  NEUROLOGIC: No numbness or migraines. Denies stroke or seizures.  PSYCHIATRIC: The patient denies anxiety, insomnia, or depression.   PHYSICAL EXAMINATION: GENERAL: The patient is obese, elderly, in no acute distress.  VITAL SIGNS: Currently remarkable for a blood pressure 114/63, heart rate 84, respiratory rate 20, temperature 98.2, sat 98% on 2 liters.  HEENT: Normocephalic, atraumatic. Pupils equally round and reactive to light and accommodation. Extraocular movements are intact. Sclerae are not icteric. Conjunctivae are clear. Oropharynx is clear.  NECK: Supple, without JVD. No adenopathy or thyromegaly.  LUNGS: Revealed decreased breath sounds, with scattered wheezes. No rhonchi or dullness. Respiratory effort is normal.  CARDIAC EXAM: A regular rate and rhythm, with normal S1, S2. No significant rubs, murmurs, or gallops. PMI is nondisplaced. Chest wall is nontender.  ABDOMEN: Soft, nontender, with normoactive bowel sounds. No organomegaly or masses were appreciated. No hernias or bruits were noted.  EXTREMITIES: Without clubbing, cyanosis, edema. Pulses were 2+  bilaterally.  SKIN: Warm and dry, without rash or lesions.  NEUROLOGIC EXAM: Revealed cranial nerves II through XII grossly intact. Deep tendon reflexes were symmetric. Motor and sensory exams nonfocal.  PSYCHIATRIC EXAM: Revealed the patient is alert and oriented to person, place, and time. She was cooperative and used  good judgment.   LABORATORY DATA: EKG revealed sinus rhythm with no acute ischemic changes. Chest x-ray revealed COPD changes, but was otherwise unremarkable. Her white count was 6.9, with a hemoglobin of 12.1. Glucose 145, with a BUN of 17, creatinine 1.13, sodium 137, potassium 3.5, and a GFR of 44.   ASSESSMENT: 1.  Chronic obstructive pulmonary disease exacerbation.  2.  Acute bronchitis.  3.  Hyperglycemia.  4.  Pulmonary fibrosis.  5.  History of methicillin-resistant Staphylococcus aureus. 6.  Stage II chronic kidney disease.   PLAN: The patient will be observed on the floor with IV fluids, IV steroids, p.o. antibiotics, and DuoNeb SVN. Will consult Dr. Raul Del. Will optimize her pulmonary regimen. Follow up chest x-ray in the morning. Wean oxygen as tolerated. Further treatment and evaluation will depend upon the patient's progress.   Total time spent on this patient was 50 minutes.    ____________________________ Leonie Douglas Doy Hutching, MD jds:mr D: 01/14/2014 14:41:00 ET T: 01/14/2014 17:26:08 ET JOB#: [id number]  cc: Leonie Douglas. Doy Hutching, MD, <Dictator> Herbon E. Raul Del, MD  Daymein Nunnery Lennice Sites MD ELECTRONICALLY SIGNED 01/14/2014 18:07

## 2015-01-05 NOTE — H&P (Signed)
PATIENT NAME:  Brittney Wood, HOJNACKI MR#:  [id number] DATE OF BIRTH:  Feb 09, 1925  DATE OF ADMISSION:  01/17/2014  PRIMARY CARE PHYSICIAN: Dr. Raul Del.   HISTORY OF PRESENT ILLNESS: The patient is an 81 year old Caucasian female with past medical history significant for history of recent admission to the hospital for COPD exacerbation where she stayed in the hospital from 01/14/2014 to 01/15/2014.  Presents back to the hospital with complaints of rectal bleed.  According to the patient, she was doing well yesterday.  However, yesterday in the evening she started having rectal bleed.  She soaked at least 3 pads each day, today as well as yesterday. She denies any clots, however admits to having bright red blood on the toilet paper as well as in the toilet itself.  She admits having generalized weakness. She has been taking aspirin 81 mg p.o. daily. She denies any abdominal pains. No nausea, vomiting.  No other abnormalities. On arrival to Emergency Room, her vital signs are stable. Her hemoglobin level is 12.6.   However, because of rectal bleed, the hospitalist services were contacted for admission. The patient had a rectal examination done by Emergency Room physician, Dr. Cinda Quest, who reported external hemorrhoids as well as blood and mucus on the finger.   PAST MEDICAL HISTORY: Significant for history of recent admission for COPD exacerbation. She is to continue. She is to still finish Levaquin therapy, also history of acute bronchitis. During this admission, hypertension, hyperlipidemia, pulmonary fibrosis, osteoporosis. Also history of MRSA, osteoarthritis, diastolic congestive heart failure, left knee surgery, status post hysterectomy.   MEDICATIONS: According to patient's medical records, patient is on Advair Diskus 250/50 of 1 puff twice daily, amlodipine 5 mg p.o. daily, Travatan 0.004% ophthalmic solution 1 drop to each eye at bedtime, Tylenol 650 mg every 8 hours as needed, HCTZ 12.5 mg daily, aspirin 81 mg  p.o. at bedtime, calcium with vitamin D once daily, omeprazole 20  mg p.o. daily, simvastatin 10 mg p.o. at bedtime, HCTZ/losartan 12.5/50 mg once daily, potassium chloride 20 mEq once daily. Prednisone taper, the patient is started on 60 mg on 01/16/2014. She is to taper by 10 mg daily. Tomorrow, she is supposed to take 30 mg once and then taper by 10 mg until stopped. Tiotropium 1 capsule inhalation once daily, Combivent Respimat 100/20 one puff 4 times daily as needed, montelukast 10 mg p.o. daily, levofloxacin 750 mg every 48 hours. The patient is to continue this antibiotic for 3 more doses, multivitamins once daily.   ALLERGIES: MORPHINE.   SOCIAL HISTORY: No alcohol, tobacco abuse.   FAMILY HISTORY: Coronary artery disease, hypertension as well as stroke.   REVIEW OF SYSTEMS: Positive for weakness, rectal bleed, history of glaucoma, bilateral cataract removal, some diarrhea which seems to be going on for years for which patient takes also Imodium AD, and according to patient's family, she is gulping down the Imodium. Admits to having arthritis in her shoulders, as well as knees. Denies any fevers, chills, pains, weight loss or gain. In regard to eyes, denies any blurry vision, double vision,  or  glaucoma.  EARS, NOSE, THROAT: Denies any tinnitus, epistaxis, sinus pain, dentures, difficulty swallowing.  RESPIRATORY: Denies any cough, wheezes, asthma, COPD.  CARDIOVASCULAR: Denies chest pain, orthopnea, edema, arrhythmia, palpitations, or   syncope. GASTROINTESTINAL: Denies any nausea, vomiting.  Admits to rectal bleeding. No change in bowel habits.  GENITOURINARY: Denies dysuria, hematuria, frequency, incontinence.  ENDOCRINOLOGY: Denies any polydipsia, nocturia, thyroid problems, heat or cold intolerance or thirst.  HEMATOLOGIC: Denies anemia, easy bruising or bleeding, swollen glands.  SKIN: Denies any acne, rashes, change in moles.  MUSCULOSKELETAL: Denies arthritis, cramps, swelling,  gout.  NEUROLOGIC: Denies numbness, epilepsy, or tremor.  PSYCHIATRIC: Denies anxiety, insomnia, depression.   PHYSICAL EXAMINATION:  VITAL SIGNS: On arrival to the hospital, temperature was 98.3, respiration rate was 20, patient's pulse was 100, blood pressure 144/67, saturation was 95% on room air.  GENERAL: This is a well developed, well nourished, obese Caucasian female, flushed, sitting on the stretcher.  HEENT: Her pupils are equal and reactive to light. Extraocular muscles intact. No icterus or conjunctivitis. Has normal hearing. No pharyngeal erythema. Mucosa is moist.  NECK: No masses. Supple, nontender. Thyroid not enlarged. No adenopathy. No JVD or carotid bruits bilaterally. Full range of motion.  LUNGS: Clear to auscultation in all fields. A few rhonchi and wheezes were heard, but no labored respirations, increased effort, dullness to percussion, or overt respiratory distress.  CARDIOVASCULAR: S1, S2 appreciated. Rhythm is regular. PMI not lateralized. Chest is nontender to palpation.  1+  pedal pulses.  No lower extremity edema, calf tenderness, or cyanosis was noted.  ABDOMEN: Soft, nontender. Bowel sounds are present. No hepatosplenomegaly or masses were noted.  RECTAL: Was done by Emergency Room  physician.  Revealed mucus as well as bright red blood on the finger, no stool, and external hemorrhoids. MUSCLE STRENGTH: Able to move all extremities. No cyanosis, degenerative joint disease. Mild kyphosis. Gait is not tested.  SKIN: Did not reveal any rashes, lesions, erythema, nodularity, induration. It was warm and dry to palpation.  LYMPHATIC: No adenopathy in the cervical region.  NEUROLOGICAL: Cranial nerves grossly intact. Sensory is intact. No dysarthria or aphasia. PSYCHIATRIC: The patient is alert and oriented to time, person and place. Cooperative. Memory is good. No significant confusion, agitation, depression noted.    LABORATORY DATA:  BMP, 01/17/2014, glucose to 185, BUN  of 31 which went up from 17 to 31, sodium 135, otherwise BMP was unremarkable. The patient's liver enzymes  : albumin  level of 3.3, AST is elevated at 98, and no liver enzymes checked in the past. Cardiac enzymes are not checked.  The patient's CBC, white blood cell count 8.8, hemoglobin 12.6 and 12.1 in the past, and platelet count 239,000.  Urinalysis: Yellow clear urine, negative for glucose, bilirubin, or ketones.  Specific gravity 1.016.  pH was 6.0, negative for blood, protein, nitrites, or leukocyte esterase. No red blood cells, less than 1 white blood cell, no bacteria, less than 1 epithelial cell.   RADIOLOGIC STUDIES: None. EKG is not done.   ASSESSMENT AND PLAN:  1. Rectal bleed. Possibly related to hemorrhoids, however cannot rule out any other reasons, such as diverticular bleed as well. Admit patient to medical floor. Hold aspirin therapy. We will check hemoglobin every 8 hours.  We will ask gastroenterologist to see patient. 2. Hyponatremia.  Possibly due to HCTZ.  We will hold HCTZ.  3. Chronic obstructive pulmonary disease exacerbation.  We will finish Levaquin p.o.  We will hold steroids at this time. Continue nebulizers.  4. Elevated transaminases we will get CK levels to rule out mild rhabdomyolysis.   TIME SPENT: 50 minutes on this patient.    ____________________________ Theodoro Grist, MD rv:dd/am D: 01/17/2014 19:43:12 ET T: 01/17/2014 22:53:45 ET JOB#: [id number]  cc: Theodoro Grist, MD, <Dictator> Dr. Dionne Bucy MD ELECTRONICALLY SIGNED 02/20/2014 15:27

## 2015-01-06 NOTE — H&P (Signed)
PATIENT NAME:  Brittney Wood, Brittney Wood MR#:  [id number] DATE OF BIRTH:  February 23, 1925  DATE OF ADMISSION:  11/27/2011  PRIMARY CARE PHYSICIAN: Wallene Huh, MD  ED REFERRING PHYSICIAN: Lisa Roca, MD  CHIEF COMPLAINT: Shortness of breath, cough, wheezing, yellow sputum production.  HISTORY OF PRESENT ILLNESS: The patient is an 81 year old Caucasian female with a past medical history of COPD, possible pulmonary hypertension, and recent admission for acute bronchitis who previously prior to her last admission was not on any treatment or inhalers who presents with the above chief complaint, for the past 36 hours.  The patient was recently admitted on 11/25/2011 for what looks to be like an acute bronchitis and was treated empirically with Levaquin IV for 1-1/2 days and then discharged home on p.o. Levaquin for 5 days and prednisone. However, upon discharge, on 11/26/2011, she was initially doing okay until this morning when she started having increasing shortness of breath, more productive yellow-greenish, almost grayish sputum, and started feeling weak and more shortness of breath with increasing wheezing. She presented to the emergency department with her daughter this evening and upon arrival she was noted to have a pulse oximetry of 88% saturation which she had never had before.  Of note, the patient denies any nausea, vomiting, or diarrhea. No history of smoking. She denies any urinary frequency, urgency, or hesitancy, or any acute fever at home.    PAST MEDICAL HISTORY:  1. History of osteoporosis. 2. History of osteoarthritis. 3. History of spinal stenosis.  4. History of diastolic congestive heart failure in the past. 5. Possible pulmonary fibrosis. 6. History of previous pneumonia and acute bronchitis. 7. History of hypertension. 8. History of hyperlipidemia. 9. History of chronic obstructive pulmonary disease.  10. Status post bilateral knee replacement. 11. Status post right hip  replacement. 12. Status post left hand ganglion cyst surgery.   DRUG ALLERGIES: Morphine.  CURRENT MEDICATIONS:  1. Aspirin 81 mg daily. 2. Citrucel 400/500 mg one tablet p.o. daily. 3. Levaquin 500 mg one tablet x4 days. 4. Dulcolax suppository rectally as needed for constipation. 5. Hydrochlorothiazide 12.5 mg daily. 6. Micardis 40 mg daily. 7. Milk of Magnesia 30 mL twice a day p.r.n. constipation. 8. Multivitamin daily. 9. Omeprazole 20 mg daily. 10. Potassium chloride 20 milliequivalents daily.  11. Simvastatin 40 mg daily. 12. Prednisone 40 mg tapered by 10 mg every 3 days.  SOCIAL HISTORY: Nonsmoker, nondrinker. No drug use. Never smoked in the past.   FAMILY HISTORY: Positive for hypertension.   REVIEW OF SYSTEMS: CONSTITUTIONAL: Complains of mild weakness and fatigue and productive sputum. Otherwise no fevers. No pain or weight loss. No weight gain. EYES: No blurred vision or double vision. No pain, redness, inflammation, or glaucoma. No cataracts. ENT: No tinnitus or ear pain. No seasonal allergies. No postnasal drip. No sinus pain or difficulty swallowing. RESPIRATORY: Complains of productive cough, yellow-greenish-grayish sputum. Wheezing and shortness of breath. No hemoptysis. History of chronic obstructive pulmonary disease and history of acute bronchitis and pneumonia in the recent past. CARDIOVASCULAR: No chest pain, orthopnea, edema, arrhythmias, palpitations, or syncope. GI: No nausea, vomiting, diarrhea, abdominal pain, hematemesis, melena, or ulcers. No gastroesophageal reflux disease. No changes in bowel habits. GU: No dysuria, hematuria, renal calculi, frequency, or incontinence. ENDOCRINE: No polydipsia, nocturia, or thyroid problems. HEME/LYMPH: Denies anemia, easy bruisability, bleeding, or node swelling.  SKIN: No acne, rash, or change in mole, hair, or skin. No recent rashes. MUSCULOSKELETAL: Has chronic pain in multiple joints due to osteoarthritis. Otherwise, no  change  from baseline. NEUROLOGIC: No numbness, no weakness, no cerebrovascular accidents, no TIAs, or seizures. PSYCH: No anxiety, insomnia, depression, or ADD.    PHYSICAL EXAMINATION:  VITALS: In the emergency department temperature is 98.7, pulse 96, respirations 20, blood pressure 118/59, and pulse oximetry on arrival was 89%. She was placed on 2 liters and now is 95%.   GENERAL: The patient is an elderly female in no acute respiratory distress, currently wearing 2 liters oxygen via nasal cannula.  HEENT: Atraumatic, normocephalic. Pupils are equally round and reactive to light and accommodation. Extraocular movements are intact. No conjunctival pallor. No scleral icterus.  Ears externally showed no drainage or erythema. Nasal examination shows no ulceration or drainage. Oropharynx is clear without any exudates.   NECK: No thyromegaly or carotid bruits.   HEART: Regular rate and rhythm. No murmurs, rubs, clicks, or gallops.  PMI is not displaced.  LUNGS: Bilateral wheezing is noted throughout all lung fields, in upper and lower lung fields. Expiratory shows about 1 to 2. There are no crackles. There is coarse upper airway sounds that radiate throughout all lung fields. There is no use of accessory muscles at this time.   ABDOMEN: Soft and nontender and nondistended. Positive bowel sounds. No hepatosplenomegaly.   EXTREMITIES: No clubbing, cyanosis, or edema.   NEURO: Awake, alert, and oriented x3. No focal neurological deficits.   SKIN: No rash.  VASCULAR: Good distal pulses.  LYMPH: No lymph nodes are palpable.  PSYCH: No anxiety or depression.  MUSCULOSKELETAL: No erythema or swelling.   LABS/STUDIES: White blood cell count 14.6, hemoglobin 12.5, hematocrit 37.0, platelet count 199. Sodium 132, potassium 4.2, chloride 95, bicarbonate 26, BUN 28, creatinine 1.03, glucose 122, anion gap 11, calcium 8.4.  Chest x-ray: Official read is still pending, but based on my read she does  seem to have a new right lower lobe lateral infiltrate based on AP film, that she has going on right now.   EKG shows normal sinus rhythm with a heart rate of about 90 beats per minute.   ASSESSMENT AND PLAN: An 81 year old white female with history of hypertension and chronic obstructive pulmonary disease recently discharged for acute bronchitis now presenting with increased shortness of breath, cough, wheezing, and weakness. 1. Shortness of breath, more than likely due to probably COPD exacerbation secondary to possibly community-acquired pneumonia.  We will treat this as a COPD flare and community-acquired pneumonia. She was previously receiving Levaquin. At this time, given the location of her infiltrate, which is predominantly in the right lower lobe, this could be complicated by an aspiration pneumonia also. However, since she is having increased sputum production and shortness of breath, we will stop the Levaquin and put her on vancomycin and Zosyn and treat her with steroids (prednisone 60 mg daily) for about five days and then start tapering. We will also place her on DuoNebs every 4 hours while awake, p.r.n. Albuterol every 2 hours, two puffs, and check blood cultures, urine cultures, and sputum cultures. The patient only follows with Dr. Wallene Huh and she requests to see him. The primary team will ask him to come by.  2. Accelerated hypertension. Continue with Micardis and hydrochlorothiazide as taking at home. 3. For gastroesophageal reflux disease, we will continue with omeprazole. 4. For hypertension, we will continue Simvastatin, home dose. 5. For GI and DVT prophylaxis, again continue with omeprazole and we will place on Lovenox for DVT prophylaxis.   TIME SPENT DICTATING AND EVALUATING THE PATIENT: 35 minutes.  ____________________________  Vilinda Boehringer, MD vm:slb D: 11/27/2011 23:11:39 ET     T: 11/28/2011 08:57:00 ET        JOB#: [id number] cc: Vilinda Boehringer, MD,  <Dictator> Herbon E. Raul Del, MD Vilinda Boehringer MD ELECTRONICALLY SIGNED 11/28/2011 22:54

## 2015-01-06 NOTE — Discharge Summary (Signed)
PATIENT NAME:  Brittney Wood, Brittney Wood MR#:  [id number] DATE OF BIRTH:  03-Jan-1925  DATE OF ADMISSION:  11/27/2011 DATE OF DISCHARGE:  12/03/2011  CONSULTANT: Dr. Raul Del for pulmonary.   CHIEF COMPLAINT: Shortness of breath.   DISCHARGE DIAGNOSES:  1. Methicillin Resistant Staphylococcus Aureus pneumonia. 2. Acute on chronic respiratory failure. 3. Hypertension. 4. Chronic diarrhea.  5. History of osteoporosis. 6. History of arthritis. 7. Spinal stenosis. 8. History of diastolic congestive heart failure in the past. 9. Possible pulmonary fibrosis. 10. History of hypertension. 11. Hyperlipidemia. 12. History of chronic obstructive pulmonary disease. 13. Bilateral knee replacements. 14. Hip replacement.   DISCHARGE MEDICATIONS:  1. Zyvox 600 mg p.o. b.i.d. for two more days.  2. Potassium chloride 20 mEq daily.  3. Omeprazole 20 mg daily.  4. Micardis HCT 40/12.5 mg daily.  5. Citrucel Petites 400 mg/500 international units daily.  6. Dulcolax suppository as needed10 mg. 7. Milk of magnesia 30 milliliters 2 times a day as needed for constipation.  8. Simvastatin 40 mg daily.  9. Aspirin 81 mg daily.  10. Multivitamin 1 tab daily.  11. Guaifenesin 15 milliliters orally every six hours as needed for cough.  12. Advair 250/50 micrograms 1 puff twice daily.  13. Albuterol/ipratropium nebulizers 2 puffs every six hours as needed for shortness of breath. 14. Albuterol/ipratropium nebulizer 3 milliliters 4 times a day as needed for shortness of breath. 15. Prednisone 30 mg daily for one day, then taper x 5 mg daily until done. 16. Amlodipine 5 mg daily.   DIET: Low sodium, ADA diet.   ACTIVITY: As tolerated.   FOLLOWUP: Please follow-up with primary care physician, Dr. Raul Del, within 1 to 2 weeks and repeat x-ray of chest in four weeks for evaluation of resolution of pneumonia.   HISTORY OF PRESENT ILLNESS AND HOSPITAL COURSE: For full details of history and physical, please see the  dictation on 11/27/2011, but briefly, this is an 81 year old female with chronic obstructive pulmonary disease, possible pulmonary hypertension, recent admission for acute bronchitis who was admitted on 11/25/2011 for possible bronchitis and then discharged on 03/14 who presented with worsening shortness of breath and not getting better with Levaquin which she was discharged on as well as the prednisone. On arrival she was hypoxic with 88% oxygen saturation and was admitted to the hospitalist service for further evaluation and management. While hospitalized, blood culture, urine culture, as well as strep Legionella and urine antigens and sputum cultures were all obtained. The patient did have significant hypoxia and some leukocytosis on arrival and x-ray of the chest was consistent with a pneumonia. Blood cultures were negative to date so far from 03/15. However, sputum cultures did show Methicillin Resistant Staphylococcus Aureus in the sputum. The patient was initially started on Vanc and Zosyn and this was switched to Zyvox. The initial x-ray of the chest on 03/15 had shown hazy right lower lobe airspace disease concerning for pneumonia. This was corroborated with a subsequent x-ray of the chest done, PA and lateral, on 03/17. Initially, she was hypoxic, however, she has been weaned off of oxygen and at this point she is ambulating. She is not short of breath and feels significantly better. Her blood pressure was suboptimally controlled and amlodipine needed to be added. She was seen by Dr. Raul Del and is on prednisone taper currently as well. The patient did have some diarrhea in the last couple of days. She has chronic diarrhea that she takes Imodium for. This has been going on for two years  off and on. Stool cultures were sent as well as Clostridium difficile which have all been negative and the diarrhea has tapered off. She has had one loose stool today only and feels much better. At this point, she will be  discharged with home health. She would also be going with home physical therapy and RN per physical therapy evaluation that took place here.   CODE STATUS: FULL CODE.   TIME SPENT: 35 minutes.    ____________________________ Vivien Presto, MD sa:ap D: 12/03/2011 13:20:37 ET T: 12/03/2011 14:01:01 ET JOB#: [id number]  cc: Vivien Presto, MD, <Dictator> Herbon E. Raul Del, MD  Vivien Presto MD ELECTRONICALLY SIGNED 12/04/2011 18:42

## 2015-01-06 NOTE — Discharge Summary (Signed)
PATIENT NAME:  Brittney Wood, Brittney Wood MR#:  [id number] DATE OF BIRTH:  [birth year]  DATE OF ADMISSION:  11/25/2011 DATE OF DISCHARGE:  11/26/2011  ED PHYSICIAN: Arman Filter, MD  ADMITTING PHYSICIAN: Dustin Flock, MD  DISCHARGING PHYSICIAN: Deanne Coffer, MD  ADMITTING DIAGNOSIS: Shortness breath, cough, and wheezing.   DISCHARGE DIAGNOSES:  1. Shortness of breath, cough, and acute respiratory failure secondary to acute bronchitis and chronic obstructive pulmonary disease flare.  2. Hypertension.  3. Gastroesophageal reflux disease. 4. Hyperlipidemia.  5. Low sodium due to dehydration versus syndrome of inappropriate antidiuretic hormone.  CONSULTANTS: 1. Wallene Huh, MD 2. Case Management.  3. Physical Therapy. 4. Occupational Therapy.  TESTS DONE DURING THIS HOSPITALIZATION: Chest x-ray on 11/25/2011: No acute changes are identified.  HOSPITAL COURSE: Initial history and physical were done by Dr. Posey Pronto. Please refer to his note dated 11/25/2011 for complete details. In brief, this is an 81 year old white female with past medical history of chronic obstructive pulmonary disease, pulmonary hypertension, osteoporosis, and osteoarthritis who presents with shortness of breath, cough, and wheezing. The patient was admitted to the hospitalist service.  1. Acute respiratory failure with cough and wheezing secondary to acute bronchitis and chronic obstructive pulmonary disease flare. The patient was treated with Solu-Medrol. She received two days of IV Levaquin and was switched to oral Levaquin for five additional days. She was switched over to prednisone and was tolerating this well. In addition she was getting nebulizers and had added on p.r.n. albuterol inhaler as well as Advair Diskus. She was seen by Dr. Raul Del who agreed and will follow-up with her as an outpatient.  2. Hypertension. The patient was continued on Micardis. 3. Gastroesophageal reflux disease. The patient was continued on  omeprazole.  4. Hyperlipidemia. The patient was continued on home dose of simvastatin.  5. Low sodium. This most likely was due to dehydration versus SIADH.  6. Deep vein thrombosis prophylaxis. She was maintained with Lovenox.  7. Debilitation. The patient was evaluated by PT and OT and recommended for home physical therapy.   DISCHARGE PHYSICAL EXAMINATION:   VITALS: Temperature 98.9, heart rate 97, respiratory rate 20, blood pressure 188/66, and saturating 95% on room air.   LUNGS: Clear to auscultation.   CARDIOVASCULAR: Regular rate and rhythm.   ABDOMEN: Benign.   DISCHARGE MEDICATIONS:  1. Potassium chloride 20 mEq daily.  2. Omeprazole 20 mg daily.  3. Micardis/hydrochlorothiazide 40/12.5 mg daily.  4. Citracal Petites 400 mg/5 international units 1 tablet once a day. 5. Dulcolax suppository 10 mg rectally daily as needed for constipation. 6. Milk of Magnesia 30 mL two times a day as needed for constipation.  7. Simvastatin 40 mg daily.  8. Multivitamin 1 tablet daily.  9. Aspirin 81 mg daily.  10. Levaquin 500 mg daily for 5 days.  11. Guaifenesin 15 mL every 6 hours p.r.n. cough.  12. Prednisone 40 mg daily, reduce x 10 mg every three days.  13. Advair Diskus 250/50 one puff twice a day. 14. Albuterol metered dose inhaler 1 to 2 puffs every four hours p.r.n. shortness of breath via nebulizer machine.  15. Albuterol ipratropium 3 mL SVN every six hours p.r.n. shortness of breath, #14.  DIET: Low sodium, low fat diet.   ACTIVITY: As tolerated with rolling walker. The patient is to see Dr. Raul Del in one week. Home health and home PT were set up with [facility].   CODE STATUS: FULL CODE.   TOTAL TIME SPENT ON DISCHARGE: 45 minutes.  ____________________________ Mike Craze  Cassell Clement, MD aaf:slb D: 11/26/2011 21:42:29 ET T: 11/27/2011 11:24:11 ET JOB#: [id number]  cc: Mike Craze A. Royden Purl, MD, <Dictator> Herbon E. Raul Del, [place] MD ELECTRONICALLY SIGNED  11/27/2011 14:23

## 2015-01-06 NOTE — H&P (Signed)
PATIENT NAME:  Brittney Wood, [place] MR#:  [id number] DATE OF BIRTH:  Mar 08, 1925  DATE OF ADMISSION:  11/25/2011  PRIMARY CARE PHYSICIAN: Dr. Raul Del  ED REFERRING PHYSICIAN: Dr. Renard Hamper   CHIEF COMPLAINT: Shortness of breath, cough, and wheezing.   HISTORY OF PRESENT ILLNESS: The patient is an 81 year old white female with a diagnosis of chronic obstructive pulmonary disease and possible pulmonary hypertension based on the listed history who is not on any treatment or inhalers, presents with complaint of having productive cough of whitish sputum for one week's duration. She also has progressive shortness of breath associated with wheezing. Prior to this episode the patient reports no shortness of breath. She complains of low-grade fevers or chills. She has not had any chest pain. No lower extremity swelling. No edema. Denies any orthopnea or nocturnal dyspnea. She denies any chest pains or palpitations. She denies any abdominal pain, nausea, vomiting, or diarrhea. She denies any urinary frequency, urgency, or hesitancy. No nausea, vomiting, or diarrhea. No lower extremity swelling.   PAST MEDICAL HISTORY:  1. History of osteoporosis.  2. History of osteoarthritis.  3. History of spinal stenosis.  4. History of diastolic congestive heart failure in the past.  5. Possible pulmonary fibrosis.  6. History of previous pneumonia.  7. Hypertension.  8. Hyperlipidemia.  9. Chronic obstructive pulmonary disease.   10. History of hemorrhoids.  11. Status post bilateral knee replacements.  12. Status post right hip replacement.  13. Status post left hand ganglion cyst surgery.   ALLERGIES: Morphine.   CURRENT MEDICATIONS:  1. Aspirin 81, 1 tab p.o. daily.  2. Citrucel? 400/500, 1 tab p.o. daily.  3. Dulcolax suppositories rectally as needed for constipation.  4. Hydrochlorothiazide 12.5 daily.  5. Micardis/HCTZ 40/12.5 mg p.o. daily.  6. Milk of Magnesia 30 mL 2 times per day as needed for constipation.   7. Multivitamin p.o. daily.  8. Omeprazole 20 daily.  9. KCL 20 mEq daily.  10. Simvastatin 40 mg at bedtime.    SOCIAL HISTORY: She has never smoked. No alcohol. No drug use.   FAMILY HISTORY: Positive for hypertension.   REVIEW OF SYSTEMS: CONSTITUTIONAL: Complains of subjective fevers, fatigue, weakness. No pain. No weight loss. No weight gain. EYES: No blurred or double vision. No pain. No redness. No inflammation. No glaucoma. No cataracts. ENT: No tinnitus. No ear pain. No hearing loss. No seasonal or year-round allergies. No postnasal drip. No sinus pain. No difficulty with swallowing. RESPIRATORY: Complains of productive cough and wheezing. No hemoptysis. Has history of chronic obstructive pulmonary disease, history of pneumonia. CARDIOVASCULAR: No chest pain. No orthopnea. No edema. No arrhythmia. No palpitations. No syncope. GI: No nausea, vomiting, diarrhea. No abdominal pain. No hematemesis. No melena. No ulcers. No gastroesophageal reflux disease. No changes in bowel habits. GU: Denies any dysuria, hematuria, renal calculus, frequency or incontinence. ENDO: Denies any polydipsia, nocturia, or thyroid problems. No increase in sweating, heat or cold intolerance or thirst. HEME/LYMPH: Denies any anemia, easy bruisability, or bleeding. SKIN: No acne. No changes in mole, hair, or skin. MUSCULOSKELETAL: Has chronic pain in multiple joints due to osteoarthritis. No gout. NEUROLOGIC: No numbness. No weakness. No cerebrovascular accident. No transient ischemic attack. No seizures. PSYCHIATRIC: No anxiety. No insomnia. No ADD.   PHYSICAL EXAMINATION:  VITAL SIGNS: Temperature 98.5, pulse 94, respirations 18, blood pressure 148/74, O2 97%.   GENERAL: The patient is an elderly female in no acute distress.   HEENT: Head atraumatic, normocephalic. Pupils equal, round, reactive to light  and accommodation. Extraocular movements intact.  There is no conjunctival pallor. No scleral icterus. Ear exam  externally showed no drainage or erythema. Nasal exam shows no ulceration or drainage. Oropharynx is clear without any exudate.  NECK: There is no thyromegaly. No carotid bruits.   CARDIOVASCULAR: Regular rate and rhythm. No murmurs, rubs, clicks, or gallops. PMI is not displaced.   LUNGS: Bilateral wheezing with occasional rhonchi. There are no crackles. No accessory muscle usage.   ABDOMEN: Soft, nontender, nondistended. Positive bowel sounds times 4. There is no hepatosplenomegaly.   EXTREMITIES: No clubbing, cyanosis, or edema.   NEUROLOGIC: Awake, alert, oriented times 3. No focal deficits.   SKIN: There is no rash.   VASCULAR: Good DP, PT pulses.   LYMPHATICS: No lymph nodes palpable.   NEUROLOGICAL: Awake, alert, oriented times 3. No focal deficits.   LYMPHATICS: No lymph nodes palpable.   PSYCHIATRIC: Not anxious or depressed.   MUSCULOSKELETAL: There is no erythema or swelling.   LABS/STUDIES: In the ED glucose 118, BNP is 140, BUN 13, creatinine 0.88, sodium 134, potassium 3.9, chloride 97, CO2 26. LFTs: Total protein 7, albumin 3.1, bilirubin total 0.3, alkaline phosphatase 83, AST 28, ALT 27. Troponin less than 0.2. WBC 7.9, hemoglobin 12.4, platelet count 165. Chest x-ray shows no acute abnormality.   ASSESSMENT AND PLAN: The patient is an 81 year old white female with history of hypertension, history of chronic obstructive pulmonary disease, not on any treatment, presents with shortness of breath, cough, and wheezing.  1. Shortness of breath, cough: Likely due to acute bronchitis and chronic obstructive pulmonary disease flare. At this time we will place her on IV Levaquin, nebulizers, as well as IV Solu-Medrol. The patient normally follows with Dr. Raul Del. She requests him to see her. We will ask him to come by.  2. Hypertension: Continue Micardis and hydrochlorothiazide as taking at home.  3. Gastroesophageal reflux disease:  I will continue omeprazole.   4. Hyperlipidemia: Continue simvastatin as at home dose.  5. Miscellaneous:  We will place her on Lovenox for deep vein thrombosis prophylaxis.    TIME SPENT: 35 minutes.  ____________________________ Lafonda Mosses Posey Pronto, MD shp:bjt D: 11/25/2011 12:33:18 ET T: 11/25/2011 13:17:16 ET JOB#: [id number]  cc: Zyriah Mask H. Posey Pronto, MD, <Dictator> Herbon E. Raul Del, MD Alric Seton MD ELECTRONICALLY SIGNED 11/28/2011 13:13

## 2015-01-08 LAB — CULTURE, BLOOD (SINGLE)

## 2015-04-29 DIAGNOSIS — C4491 Basal cell carcinoma of skin, unspecified: Secondary | ICD-10-CM

## 2015-04-29 HISTORY — DX: Basal cell carcinoma of skin, unspecified: C44.91

## 2015-07-06 ENCOUNTER — Encounter: Payer: Self-pay | Admitting: Emergency Medicine

## 2015-07-06 ENCOUNTER — Inpatient Hospital Stay
Admission: EM | Admit: 2015-07-06 | Discharge: 2015-07-07 | DRG: 192 | Disposition: A | Discharge status: Home / Self Care | Payer: Medicare Other | Attending: Internal Medicine | Admitting: Internal Medicine

## 2015-07-06 ENCOUNTER — Emergency Department: Payer: Medicare Other

## 2015-07-06 DIAGNOSIS — Z85828 Personal history of other malignant neoplasm of skin: Secondary | ICD-10-CM

## 2015-07-06 DIAGNOSIS — I509 Heart failure, unspecified: Secondary | ICD-10-CM | POA: Diagnosis present

## 2015-07-06 DIAGNOSIS — F0390 Unspecified dementia without behavioral disturbance: Secondary | ICD-10-CM | POA: Diagnosis present

## 2015-07-06 DIAGNOSIS — J441 Chronic obstructive pulmonary disease with (acute) exacerbation: Secondary | ICD-10-CM | POA: Diagnosis present

## 2015-07-06 DIAGNOSIS — Z79899 Other long term (current) drug therapy: Secondary | ICD-10-CM

## 2015-07-06 DIAGNOSIS — Z8249 Family history of ischemic heart disease and other diseases of the circulatory system: Secondary | ICD-10-CM

## 2015-07-06 DIAGNOSIS — K219 Gastro-esophageal reflux disease without esophagitis: Secondary | ICD-10-CM | POA: Diagnosis present

## 2015-07-06 DIAGNOSIS — R0602 Shortness of breath: Secondary | ICD-10-CM | POA: Diagnosis not present

## 2015-07-06 DIAGNOSIS — R52 Pain, unspecified: Secondary | ICD-10-CM | POA: Diagnosis present

## 2015-07-06 DIAGNOSIS — Z7952 Long term (current) use of systemic steroids: Secondary | ICD-10-CM | POA: Diagnosis not present

## 2015-07-06 DIAGNOSIS — G8929 Other chronic pain: Secondary | ICD-10-CM | POA: Diagnosis present

## 2015-07-06 DIAGNOSIS — I110 Hypertensive heart disease with heart failure: Secondary | ICD-10-CM | POA: Diagnosis present

## 2015-07-06 HISTORY — DX: Essential (primary) hypertension: I10

## 2015-07-06 HISTORY — DX: Malignant (primary) neoplasm, unspecified: C80.1

## 2015-07-06 HISTORY — DX: Heart failure, unspecified: I50.9

## 2015-07-06 HISTORY — DX: Chronic obstructive pulmonary disease, unspecified: J44.9

## 2015-07-06 LAB — URINALYSIS COMPLETE WITH MICROSCOPIC (ARMC ONLY)
BACTERIA UA: NONE SEEN
BILIRUBIN URINE: NEGATIVE
Glucose, UA: NEGATIVE mg/dL
Hgb urine dipstick: NEGATIVE
Ketones, ur: NEGATIVE mg/dL
Leukocytes, UA: NEGATIVE
Nitrite: NEGATIVE
PH: 7 (ref 5.0–8.0)
Protein, ur: NEGATIVE mg/dL
Specific Gravity, Urine: 1.014 (ref 1.005–1.030)

## 2015-07-06 LAB — CBC
HCT: 34.3 % — ABNORMAL LOW (ref 35.0–47.0)
Hemoglobin: 10.8 g/dL — ABNORMAL LOW (ref 12.0–16.0)
MCH: 23.9 pg — ABNORMAL LOW (ref 26.0–34.0)
MCHC: 31.5 g/dL — ABNORMAL LOW (ref 32.0–36.0)
MCV: 76 fL — AB (ref 80.0–100.0)
Platelets: 154 10*3/uL (ref 150–440)
RBC: 4.51 MIL/uL (ref 3.80–5.20)
RDW: 18.3 % — AB (ref 11.5–14.5)
WBC: 6.5 10*3/uL (ref 3.6–11.0)

## 2015-07-06 LAB — COMPREHENSIVE METABOLIC PANEL
ALK PHOS: 88 U/L (ref 38–126)
ALT: 24 U/L (ref 14–54)
AST: 31 U/L (ref 15–41)
Albumin: 3.3 g/dL — ABNORMAL LOW (ref 3.5–5.0)
Anion gap: 9 (ref 5–15)
BILIRUBIN TOTAL: 0.3 mg/dL (ref 0.3–1.2)
BUN: 17 mg/dL (ref 6–20)
CO2: 24 mmol/L (ref 22–32)
CREATININE: 0.68 mg/dL (ref 0.44–1.00)
Calcium: 8.7 mg/dL — ABNORMAL LOW (ref 8.9–10.3)
Chloride: 106 mmol/L (ref 101–111)
GFR calc Af Amer: >60 mL/min (ref >60)
GLUCOSE: 128 mg/dL — AB (ref 65–99)
Potassium: 3.8 mmol/L (ref 3.5–5.1)
Sodium: 139 mmol/L (ref 135–145)
TOTAL PROTEIN: 6.8 g/dL (ref 6.5–8.1)

## 2015-07-06 LAB — BRAIN NATRIURETIC PEPTIDE: B NATRIURETIC PEPTIDE 5: 56 pg/mL (ref 0.0–100.0)

## 2015-07-06 LAB — FIBRIN DERIVATIVES D-DIMER (ARMC ONLY): FIBRIN DERIVATIVES D-DIMER (ARMC): 1218 — AB (ref 0–499)

## 2015-07-06 LAB — TROPONIN I: Troponin I: <0.03 ng/mL (ref <0.031)

## 2015-07-06 MED ORDER — IPRATROPIUM-ALBUTEROL 0.5-2.5 (3) MG/3ML IN SOLN: 3.0000 mL | RESPIRATORY_TRACT | Status: DC | PRN

## 2015-07-06 MED ORDER — ACETAMINOPHEN 325 MG PO TABS
650.0000 mg | ORAL_TABLET | Freq: Four times a day (QID) | ORAL | Status: DC | PRN
  Administered 2015-07-06 – 2015-07-07 (×2): 650 mg via ORAL
  Filled 2015-07-06 (×2): qty 2

## 2015-07-06 MED ORDER — IOHEXOL 350 MG/ML SOLN
75.0000 mL | Freq: Once | INTRAVENOUS | Status: AC | PRN
  Administered 2015-07-06: 75 mL via INTRAVENOUS

## 2015-07-06 MED ORDER — METHYLPREDNISOLONE SODIUM SUCC 125 MG IJ SOLR
60.0000 mg | INTRAMUSCULAR | Status: DC
  Administered 2015-07-06: 60 mg via INTRAVENOUS
  Filled 2015-07-06: qty 2

## 2015-07-06 MED ORDER — AMLODIPINE BESYLATE 5 MG PO TABS
5.0000 mg | ORAL_TABLET | Freq: Every day | ORAL | Status: DC
  Administered 2015-07-07: 5 mg via ORAL
  Filled 2015-07-06: qty 1

## 2015-07-06 MED ORDER — ONDANSETRON HCL 4 MG/2ML IJ SOLN: 4.0000 mg | Freq: Four times a day (QID) | INTRAMUSCULAR | Status: DC | PRN

## 2015-07-06 MED ORDER — MOMETASONE FURO-FORMOTEROL FUM 100-5 MCG/ACT IN AERO
2.0000 | INHALATION_SPRAY | Freq: Two times a day (BID) | RESPIRATORY_TRACT | Status: DC
  Administered 2015-07-06 – 2015-07-07 (×2): 2 via RESPIRATORY_TRACT
  Filled 2015-07-06: qty 8.8

## 2015-07-06 MED ORDER — MONTELUKAST SODIUM 10 MG PO TABS
10.0000 mg | ORAL_TABLET | Freq: Every day | ORAL | Status: DC
  Administered 2015-07-06: 10 mg via ORAL
  Filled 2015-07-06: qty 1

## 2015-07-06 MED ORDER — LATANOPROST 0.005 % OP SOLN
1.0000 [drp] | Freq: Every day | OPHTHALMIC | Status: DC
  Administered 2015-07-06: 1 [drp] via OPHTHALMIC
  Filled 2015-07-06: qty 2.5

## 2015-07-06 MED ORDER — MORPHINE SULFATE (PF) 2 MG/ML IV SOLN: 2.0000 mg | INTRAVENOUS | Status: DC | PRN

## 2015-07-06 MED ORDER — HEPARIN SODIUM (PORCINE) 5000 UNIT/ML IJ SOLN
5000.0000 [IU] | Freq: Three times a day (TID) | INTRAMUSCULAR | Status: DC
  Administered 2015-07-06 – 2015-07-07 (×2): 5000 [IU] via SUBCUTANEOUS
  Filled 2015-07-06 (×2): qty 1

## 2015-07-06 MED ORDER — ONDANSETRON HCL 4 MG PO TABS: 4.0000 mg | ORAL_TABLET | Freq: Four times a day (QID) | ORAL | Status: DC | PRN

## 2015-07-06 MED ORDER — ACETAMINOPHEN 650 MG RE SUPP: 650.0000 mg | Freq: Four times a day (QID) | RECTAL | Status: DC | PRN

## 2015-07-06 MED ORDER — IPRATROPIUM-ALBUTEROL 0.5-2.5 (3) MG/3ML IN SOLN
3.0000 mL | Freq: Once | RESPIRATORY_TRACT | Status: AC
  Administered 2015-07-06: 3 mL via RESPIRATORY_TRACT
  Filled 2015-07-06: qty 3

## 2015-07-06 MED ORDER — TRAVOPROST (BAK FREE) 0.004 % OP SOLN
1.0000 [drp] | Freq: Every day | OPHTHALMIC | Status: DC
  Filled 2015-07-06: qty 2.5

## 2015-07-06 MED ORDER — PANTOPRAZOLE SODIUM 40 MG PO TBEC
40.0000 mg | DELAYED_RELEASE_TABLET | Freq: Every day | ORAL | Status: DC
  Administered 2015-07-07: 40 mg via ORAL
  Filled 2015-07-06: qty 1

## 2015-07-06 MED ORDER — OXYCODONE HCL 5 MG PO TABS
5.0000 mg | ORAL_TABLET | ORAL | Status: DC | PRN
  Administered 2015-07-07: 5 mg via ORAL
  Filled 2015-07-06: qty 1

## 2015-07-06 NOTE — ED Notes (Signed)
States feeling sob on and off x 1 week. Per ems ekg showed bundle branch block but pt denies hx.

## 2015-07-06 NOTE — H&P (Signed)
[facility] [place] NAME: Brittney Wood    MR#:  [id number]  DATE OF BIRTH:  [birth year]   DATE OF ADMISSION:  07/06/2015  PRIMARY CARE PHYSICIAN: No PCP Per Patient   REQUESTING/REFERRING PHYSICIAN: [place]  CHIEF COMPLAINT:   Chief Complaint  Patient presents with  . Shortness of Breath    feeling sob off and on x 1 week    HISTORY OF PRESENT ILLNESS:  Brittney Wood  is a 81 y.o. female with a known history of COPD non-oxygen dependent presenting with shortness of breath. She describes approximately one week total duration of shortness of breath mainly dyspnea on exertion with associated nonproductive cough. Denies any fevers, chills, edema, chest pain or further symptomatology. Given duration of symptoms as well as worsening ascites present to Hospital further workup and evaluation. She had improvement in the emergency department after breathing treatments however while attending to a blade she desatted to the mid 80s on room air.  PAST MEDICAL HISTORY:   Past Medical History  Diagnosis Date  . CHF (congestive heart failure) ([place])   . Hypertension   . COPD (chronic obstructive pulmonary disease) ([place] [facility])   . Cancer ([place])     skin    PAST SURGICAL HISTORY:   Past Surgical History  Procedure Laterality Date  . Abdominal hysterectomy      SOCIAL HISTORY:   Social History  Substance Use Topics  . Smoking status: Never Smoker   . Smokeless tobacco: Not on file  . Alcohol Use: No    FAMILY HISTORY:   Family History  Problem Relation Age of Onset  . Hypertension Other     DRUG ALLERGIES:  No Known Allergies  REVIEW OF SYSTEMS:  REVIEW OF SYSTEMS:  CONSTITUTIONAL: Denies fevers, chills, fatigue, weakness.  EYES: Denies blurred vision, double vision, or eye pain.  EARS, NOSE, THROAT: Denies tinnitus, ear pain, hearing loss.  RESPIRATORY: Positive cough, shortness of breath, denies wheezing  CARDIOVASCULAR:  Denies chest pain, palpitations, edema.  GASTROINTESTINAL: Denies nausea, vomiting, diarrhea, abdominal pain.  GENITOURINARY: Denies dysuria, hematuria.  ENDOCRINE: Denies nocturia or thyroid problems. HEMATOLOGIC AND LYMPHATIC: Denies easy bruising or bleeding.  SKIN: Denies rash or lesions.  MUSCULOSKELETAL: Denies pain in neck, back, shoulder, knees, hips, or further arthritic symptoms.  NEUROLOGIC: Denies paralysis, paresthesias.  PSYCHIATRIC: Denies anxiety or depressive symptoms. Otherwise full review of systems performed by me is negative.   MEDICATIONS AT HOME:   Prior to Admission medications   Medication Sig Start Date End Date Taking? Authorizing Provider  acetaminophen (TYLENOL) 650 MG CR tablet Take 650 mg by mouth 3 (three) times daily.   Yes Historical Provider, MD  amLODipine (NORVASC) 5 MG tablet Take 5 mg by mouth daily.   Yes Historical Provider, MD  Calcium Carbonate-Vitamin D 600-400 MG-UNIT tablet Take 1 tablet by mouth every morning.   Yes Historical Provider, MD  Fluticasone-Salmeterol (ADVAIR) 250-50 MCG/DOSE AEPB Inhale 1 puff into the lungs 2 (two) times daily.   Yes Historical Provider, MD  montelukast (SINGULAIR) 10 MG tablet Take 10 mg by mouth every morning.    Yes Historical Provider, MD  Multiple Vitamins-Minerals (CENTRUM SILVER PO) Take 1 tablet by mouth every morning.   Yes Historical Provider, MD  omeprazole (PRILOSEC) 20 MG capsule Take 20 mg by mouth daily.   Yes Historical Provider, MD  travoprost, benzalkonium, (TRAVATAN) 0.004 % ophthalmic solution Place 1 drop into both eyes at bedtime.   Yes  Historical Provider, MD      VITAL SIGNS:  Blood pressure 142/75, pulse 90, temperature 98.4 F (36.9 C), temperature source Oral, resp. rate 18, height 5' (1.524 m), weight 168 lb (76.204 kg), SpO2 94 %.  PHYSICAL EXAMINATION:  VITAL SIGNS: Filed Vitals:   07/06/15 2140  BP: 142/75  Pulse: 90  Temp:   Resp: 54   GENERAL:81 y.o.female currently  in no acute distress.  HEAD: Normocephalic, atraumatic.  EYES: Pupils equal, round, reactive to light. Extraocular muscles intact. No scleral icterus.  MOUTH: Moist mucosal membrane. Dentition intact. No abscess noted.  EAR, NOSE, THROAT: Clear without exudates. No external lesions.  NECK: Supple. No thyromegaly. No nodules. No JVD.  PULMONARY: Diminished breath sounds throughout all lung fields without frank wheeze or rhonchi No use of accessory muscles, Good respiratory effort. Poor air entry bilaterally CHEST: Nontender to palpation.  CARDIOVASCULAR: S1 and S2. Regular rate and rhythm. No murmurs, rubs, or gallops. No edema. Pedal pulses 2+ bilaterally.  GASTROINTESTINAL: Soft, nontender, nondistended. No masses. Positive bowel sounds. No hepatosplenomegaly.  MUSCULOSKELETAL: No swelling, clubbing, or edema. Range of motion full in all extremities.  NEUROLOGIC: Cranial nerves II through XII are intact. No gross focal neurological deficits. Sensation intact. Reflexes intact.  SKIN: No ulceration, lesions, rashes, or cyanosis. Skin warm and dry. Turgor intact.  PSYCHIATRIC: Mood, affect within normal limits. The patient is awake, alert and oriented x 3. Insight, judgment intact.    LABORATORY PANEL:   CBC  Recent Labs Lab 07/06/15 1652  WBC 6.5  HGB 10.8*  HCT 34.3*  PLT 154   ------------------------------------------------------------------------------------------------------------------  Chemistries   Recent Labs Lab 07/06/15 1652  NA 139  K 3.8  CL 106  CO2 24  GLUCOSE 128*  BUN 17  CREATININE 0.68  CALCIUM 8.7*  AST 31  ALT 24  ALKPHOS 88  BILITOT 0.3   ------------------------------------------------------------------------------------------------------------------  Cardiac Enzymes  Recent Labs Lab 07/06/15 1652  TROPONINI <0.03    ------------------------------------------------------------------------------------------------------------------  RADIOLOGY:  Ct Angio Chest Pe W/cm &/or Wo Cm  07/06/2015  CLINICAL DATA:  Shortness of breath, elevated D-dimer EXAM: CT ANGIOGRAPHY CHEST WITH CONTRAST TECHNIQUE: Multidetector CT imaging of the chest was performed using the standard protocol during bolus administration of intravenous contrast. Multiplanar CT image reconstructions and MIPs were obtained to evaluate the vascular anatomy. CONTRAST:  21mL OMNIPAQUE IOHEXOL 350 MG/ML SOLN COMPARISON:  Chest radiograph dated 07/06/2015 FINDINGS: No evidence of pulmonary embolism. Mediastinum/Nodes: Heart is normal in size. No pericardial effusion. Mild atherosclerotic calcifications of the aortic arch. Low-density right paratracheal nodes measuring up to 16 mm short axis (series 4/ image 51) versus fluid in the superior pericardial recess. 13 mm short axis AP window node (series 4/ image 46). Visualized thyroid is unremarkable. Lungs/Pleura: Focal patchy opacity at the left lung base, likely atelectasis. No suspicious pulmonary nodules. Mild centrilobular and paraseptal emphysematous changes with a dominant bulla in the medial right upper lobe. No frank interstitial edema.  No pleural effusion or pneumothorax. Upper abdomen: Visualized upper abdomen is notable for a mildly nodular hepatic contour and a 2.6 cm anterior right upper pole renal cyst. Musculoskeletal: Degenerative changes of the visualized thoracolumbar spine. Review of the MIP images confirms the above findings. IMPRESSION: No evidence of pulmonary embolism. No evidence of acute cardiopulmonary disease. Electronically Signed   By: Julian Hy M.D.   On: 07/06/2015 19:58   Dg Chest Port 1 View  07/06/2015  CLINICAL DATA:  Shortness of breath for approximately 3 weeks,  worse with exertion. History of CHF. History of pneumonia. EXAM: PORTABLE CHEST 1 VIEW COMPARISON:  Chest  x-ray dated 01/03/2015. FINDINGS: Cardiomediastinal silhouette is stable in size and configuration. Heart size remains upper normal. Hazy opacity at the left lung base is unchanged and presumably chronic scarring or atelectasis. Lungs otherwise clear. No new lung findings. No evidence of active congestive heart failure. No evidence of pneumonia. No pleural effusion seen. No pneumothorax. Degenerative changes again noted at the bilateral shoulder joints. No acute osseous abnormality. IMPRESSION: Stable chest x-ray. No evidence of acute cardiopulmonary abnormality. Electronically Signed   By: Franki Cabot M.D.   On: 07/06/2015 17:02    EKG:   Orders placed or performed during the hospital encounter of 07/06/15  . ED EKG  . ED EKG    IMPRESSION AND PLAN:   81 year old Caucasian female history of COPD non-oxygen dependent presenting with shortness of breath  1.Chronic obstructive pulmonary disease exacerbation: Provide DuoNeb treatments q. 4 hours, Solu-Medrol 60 mg IV q. daily, . Continue with home medications.  2. GERD without esophagitis: PPI therapy 3. Essential hypertension: Norvasc 4. Venous thromboembolism prophylactic: Heparin subcutaneous    All the records are reviewed and case discussed with ED provider. Management plans discussed with the patient, family and they are in agreement.  CODE STATUS: Full  TOTAL TIME TAKING CARE OF THIS PATIENT: 35 minutes.    Wynona Duhamel,  Karenann Cai.D on 07/06/2015 at 9:48 PM  Between 7am to 6pm - Pager - [phone]  After 6pm: House Pager: - [phone]  [facility]  Office  [phone]  CC: Primary care physician; No PCP Per Patient

## 2015-07-06 NOTE — ED Provider Notes (Signed)
[facility] Emergency Department Provider Note  ____________________________________________  Time seen: On arrival  I have reviewed the triage vital signs and the nursing notes.   HISTORY  Chief Complaint Shortness of Breath    HPI Brittney Wood is a 81 y.o. female who reports worsening shortness of breath for the last week. She denies fevers or chills. She denies cough. She does report a history of pneumonia greater than a year ago. She also notes she has been told that she has CHF but she does not know a lot about that. She does report that she sleeps in her recliner is unable to lie flat. She denies lower extremity swelling., No calf pain. No recent travel. No nausea no vomiting no chest pain.She reports her breathing difficulty is worse with ambulation     Past Medical History  Diagnosis Date  . CHF (congestive heart failure) ([place])   . Hypertension   . COPD (chronic obstructive pulmonary disease) ([facility])     There are no active problems to display for this patient.   Past Surgical History  Procedure Laterality Date  . Abdominal hysterectomy      No current outpatient prescriptions on file.  Allergies Review of patient's allergies indicates no known allergies.  History reviewed. No pertinent family history.  Social History Social History  Substance Use Topics  . Smoking status: Never Smoker   . Smokeless tobacco: None  . Alcohol Use: No    Review of Systems  Constitutional: Negative for fever. Eyes: Negative for visual changes. ENT: Negative for sore throat Cardiovascular: Negative for chest pain. Respiratory: Positive for short of breath Gastrointestinal: Negative for abdominal pain, vomiting and diarrhea. Genitourinary: Negative for dysuria. Musculoskeletal: Negative for back pain. Skin: Negative for rash. Neurological: Negative for headaches or focal weakness Psychiatric: No  anxiety    ____________________________________________   PHYSICAL EXAM:  VITAL SIGNS: ED Triage Vitals  Enc Vitals Group     BP 07/06/15 1612 131/69 mmHg     Pulse Rate 07/06/15 1612 89     Resp 07/06/15 1612 20     Temp 07/06/15 1612 98.4 F (36.9 C)     Temp Source 07/06/15 1612 Oral     SpO2 07/06/15 1612 97 %     Weight 07/06/15 1612 168 lb (76.204 kg)     Height 07/06/15 1612 5' (1.524 m)     Head Cir --      Peak Flow --      Pain Score --      Pain Loc --      Pain Edu? --      Excl. in [place]? --      Constitutional: Alert and oriented. Well appearing and in no distress. Pleasant and interactive  Eyes: Conjunctivae are normal.  ENT   Head: Normocephalic and atraumatic.   Mouth/Throat: Mucous membranes are moist. Cardiovascular: Normal rate, regular rhythm. Normal and symmetric distal pulses are present in all extremities. No murmurs, rubs, or gallops. Respiratory: Normal respiratory effort without tachypnea nor retractions. Breath sounds are clear and equal bilaterally. No wheezing noted Gastrointestinal: Soft and non-tender in all quadrants. No distention. There is no CVA tenderness. Genitourinary: deferred Musculoskeletal: Nontender with normal range of motion in all extremities. No lower extremity tenderness nor edema. Neurologic:  Normal speech and language. No gross focal neurologic deficits are appreciated. Skin:  Skin is warm, dry and intact. No rash noted. Psychiatric: Mood and affect are normal. Patient exhibits appropriate insight and judgment.  ____________________________________________  LABS (pertinent positives/negatives)  Labs Reviewed  CBC  COMPREHENSIVE METABOLIC PANEL  TROPONIN I  URINALYSIS COMPLETEWITH MICROSCOPIC ([facility] ONLY)  BRAIN NATRIURETIC PEPTIDE    ____________________________________________   EKG  ED ECG REPORT I, Lavonia Drafts, the attending physician, personally viewed and interpreted this ECG.   Date:  07/06/2015  EKG Time: 4:27 PM  Rate: 93  Rhythm: normal sinus rhythm  Axis: Normal  Intervals:left bundle branch block  ST&T Change: Nonspecific   ____________________________________________    RADIOLOGY I have personally reviewed any xrays that were ordered on this patient: Chest x-ray unremarkable CT angiography shows no PE  ____________________________________________   PROCEDURES  Procedure(s) performed: none  Critical Care performed: none  ____________________________________________   INITIAL IMPRESSION / ASSESSMENT AND PLAN / ED COURSE  Pertinent labs & imaging results that were available during my care of the patient were reviewed by me and considered in my medical decision making (see chart for details).  Patient overall well-appearing. She is slightly hypoxic with pulse oximetry is running 94% while in bed. Lung exam is fairly unremarkable. No edema noted. Suspicious for CHF given history of present illness. We will check chest x-ray labs and reevaluate  Chest x-ray unremarkable. D-dimer elevated, CT angio ordered  CT angiography negative. With ambulatory Pulse ox patient desaturates to 87% and becomes tachycardic. I will admit for further management  ____________________________________________   FINAL CLINICAL IMPRESSION(S) / ED DIAGNOSES  Final diagnoses:  COPD with exacerbation ([place])  Shortness of breath     Lavonia Drafts, MD 07/06/15 2105

## 2015-07-07 MED ORDER — PREDNISONE 50 MG PO TABS: 50.0000 mg | ORAL_TABLET | Freq: Every day | ORAL | Status: DC

## 2015-07-07 MED ORDER — TIOTROPIUM BROMIDE MONOHYDRATE 18 MCG IN CAPS: 18.0000 ug | ORAL_CAPSULE | Freq: Every day | RESPIRATORY_TRACT | Status: DC

## 2015-07-07 MED ORDER — LEVOFLOXACIN 250 MG PO TABS: 250.0000 mg | ORAL_TABLET | Freq: Every day | ORAL | Status: DC

## 2015-07-07 MED ORDER — ALBUTEROL SULFATE HFA 108 (90 BASE) MCG/ACT IN AERS: 2.0000 | INHALATION_SPRAY | Freq: Four times a day (QID) | RESPIRATORY_TRACT | Status: DC | PRN

## 2015-07-07 MED ORDER — FLUTICASONE-SALMETEROL 250-50 MCG/DOSE IN AEPB: 1.0000 | INHALATION_SPRAY | Freq: Two times a day (BID) | RESPIRATORY_TRACT | Status: DC

## 2015-07-07 NOTE — Progress Notes (Addendum)
   07/07/15 0600  Urine Characteristics  Bladder Scan Volume (mL) 142 mL  Pt bladder scanned d/t frequency and urgency. She states "I have to pee really bad" about every 15 min but has no urine coming out majority of the time. Last void at 0545. Will cont to monitor.

## 2015-07-07 NOTE — Progress Notes (Signed)
   07/07/15 0946  Clinical Encounter Type  Visited With Patient  Visit Type Initial  Referral From Nurse  Consult/Referral To Chaplain  Spiritual Encounters  Spiritual Needs Prayer  Stress Factors  Patient Stress Factors Health changes  Provided pastoral presence, support and prayer to patient on unit.  [place] [phone]

## 2015-07-07 NOTE — Care Management Note (Addendum)
Case Management Note  Patient Details  Name: Taiwan Millon MRN: [id number] Date of Birth: 1925-05-01  Subjective/Objective:   Attempted to discuss discharge planning with Ms Natividad but she appeared to be confused per her responses. Call to Dr Darvin Neighbours who spoke with her daughter earlier today. Daughter reported that Ms Sherbert resides at [place] and has a home health aid from "[facility] in [place]. Ph: [phone]". Ms Sides is not receiving any other home health services.                 Action/Plan:   Expected Discharge Date:                  Expected Discharge Plan:     In-House Referral:     Discharge planning Services     Post Acute Care Choice:    Choice offered to:     DME Arranged:    DME Agency:     HH Arranged:    [employer] Agency:     Status of Service:     Medicare Important Message Given:    Date Medicare IM Given:    Medicare IM give by:    Date Additional Medicare IM Given:    Additional Medicare Important Message give by:     If discussed at [place] of Stay Meetings, dates discussed:    Additional Comments:  Baylin Cabal A, RN 07/07/2015, 12:22 PM

## 2015-07-07 NOTE — Discharge Instructions (Addendum)
°  DIET:  Cardiac diet  DISCHARGE CONDITION:  Stable  ACTIVITY:  Activity as tolerated  OXYGEN:  Home Oxygen: No.   Oxygen Delivery: room air  DISCHARGE LOCATION:  home   If you experience worsening of your admission symptoms, develop shortness of breath, life threatening emergency, suicidal or homicidal thoughts you must seek medical attention immediately by calling 911 or calling your MD immediately  if symptoms less severe.  You Must read complete instructions/literature along with all the possible adverse reactions/side effects for all the Medicines you take and that have been prescribed to you. Take any new Medicines after you have completely understood and accpet all the possible adverse reactions/side effects.   Please note  You were cared for by a hospitalist during your hospital stay. If you have any questions about your discharge medications or the care you received while you were in the hospital after you are discharged, you can call the unit and asked to speak with the hospitalist on call if the hospitalist that took care of you is not available. Once you are discharged, your primary care physician will handle any further medical issues. Please note that NO REFILLS for any discharge medications will be authorized once you are discharged, as it is imperative that you return to your primary care physician (or establish a relationship with a primary care physician if you do not have one) for your aftercare needs so that they can reassess your need for medications and monitor your lab values.   Neurology f/u in 2-3 weeks with Dr. Manuella Ghazi for dementia. Please call to make appointment

## 2015-07-07 NOTE — Progress Notes (Signed)
07/07/2015 14:00  Brittney Wood to be D/C'd Home per MD order.  Discussed prescriptions and follow up appointments with the patient. Prescriptions given to patient, medication list explained in detail. Pt verbalized understanding.    Medication List    TAKE these medications        acetaminophen 650 MG CR tablet  Commonly known as:  TYLENOL  Take 650 mg by mouth 3 (three) times daily.     albuterol 108 (90 BASE) MCG/ACT inhaler  Commonly known as:  PROVENTIL HFA;VENTOLIN HFA  Inhale 2 puffs into the lungs every 6 (six) hours as needed for wheezing or shortness of breath.     amLODipine 5 MG tablet  Commonly known as:  NORVASC  Take 5 mg by mouth daily.     Calcium Carbonate-Vitamin D 600-400 MG-UNIT tablet  Take 1 tablet by mouth every morning.     CENTRUM SILVER PO  Take 1 tablet by mouth every morning.     Fluticasone-Salmeterol 250-50 MCG/DOSE Aepb  Commonly known as:  ADVAIR  Inhale 1 puff into the lungs 2 (two) times daily.     levofloxacin 250 MG tablet  Commonly known as:  LEVAQUIN  Take 1 tablet (250 mg total) by mouth daily.     montelukast 10 MG tablet  Commonly known as:  SINGULAIR  Take 10 mg by mouth every morning.     omeprazole 20 MG capsule  Commonly known as:  PRILOSEC  Take 20 mg by mouth daily.     predniSONE 50 MG tablet  Commonly known as:  DELTASONE  Take 1 tablet (50 mg total) by mouth daily with breakfast.     tiotropium 18 MCG inhalation capsule  Commonly known as:  SPIRIVA HANDIHALER  Place 1 capsule (18 mcg total) into inhaler and inhale daily.     travoprost (benzalkonium) 0.004 % ophthalmic solution  Commonly known as:  TRAVATAN  Place 1 drop into both eyes at bedtime.        Filed Vitals:   07/07/15 1221  BP: 138/62  Pulse: 89  Temp: 98 F (36.7 C)  Resp: 17    Skin clean, dry and intact without evidence of skin break down, no evidence of skin tears noted. IV catheter discontinued intact. Site without signs and symptoms  of complications. Dressing and pressure applied. Pt denies pain at this time. No complaints noted.  An After Visit Summary was printed and given to the patient. Patient escorted via [place], and D/C home via private auto.  [name]

## 2015-07-12 NOTE — Discharge Summary (Signed)
[facility] [place] NAME: Brittney Wood    MR#:  [id number]  [place]:  Dec 14, 1924  DATE OF ADMISSION:  07/06/2015 ADMITTING PHYSICIAN: Lytle Butte, MD  DATE OF DISCHARGE: 07/07/2015  2:24 PM  PRIMARY CARE PHYSICIAN: No PCP Per Patient    ADMISSION DIAGNOSIS:  Shortness of breath [R06.02] COPD with exacerbation ([place]) [J44.1]  DISCHARGE DIAGNOSIS:  Active Problems:   COPD exacerbation ([place])   SECONDARY DIAGNOSIS:   Past Medical History  Diagnosis Date  . CHF (congestive heart failure) ([place])   . Hypertension   . COPD (chronic obstructive pulmonary disease) ([place])   . Cancer [facility])     skin     ADMITTING HISTORY  Brittney Wood is a 81 y.o. female with a known history of COPD non-oxygen dependent presenting with shortness of breath. She describes approximately one week total duration of shortness of breath mainly dyspnea on exertion with associated nonproductive cough. Denies any fevers, chills, edema, chest pain or further symptomatology. Given duration of symptoms as well as worsening ascites present to Hospital further workup and evaluation. She had improvement in the emergency department after breathing treatments however while attending to a blade she desatted to the mid 80s on room air.   HOSPITAL COURSE:   Patient was admitted onto medical floor for COPD exacerbation. Was found to be wheezing on admission. Started on IV steroids, antibiotics, scheduled nebulizers. She improved well by the day of discharge feeling back to baseline. Not needing any oxygen. Her main concern at discharge was generalized body pains which is chronic. Also some on and off confusion due to worsening dementia for which we are referring her to neurology as outpatient. Patient's oxygen saturations are normal. Ambulated without any problem using a walker which is her baseline. Stable for discharge home on oral prednisone, antibiotics,  inhalers.   CONSULTS OBTAINED:  Treatment Team:  Lytle Butte, MD  DRUG ALLERGIES:   Allergies  Allergen Reactions  . Morphine And Related Other (See Comments)    Hallucinations    DISCHARGE MEDICATIONS:   Discharge Medication List as of 07/07/2015  1:45 PM    START taking these medications   Details  albuterol (PROVENTIL HFA;VENTOLIN HFA) 108 (90 BASE) MCG/ACT inhaler Inhale 2 puffs into the lungs every 6 (six) hours as needed for wheezing or shortness of breath., Starting 07/07/2015, Until Discontinued, Normal    levofloxacin (LEVAQUIN) 250 MG tablet Take 1 tablet (250 mg total) by mouth daily., Starting 07/07/2015, Until Discontinued, Normal    predniSONE (DELTASONE) 50 MG tablet Take 1 tablet (50 mg total) by mouth daily with breakfast., Starting 07/07/2015, Until Discontinued, Normal    tiotropium (SPIRIVA HANDIHALER) 18 MCG inhalation capsule Place 1 capsule (18 mcg total) into inhaler and inhale daily., Starting 07/07/2015, Until Discontinued, Normal      CONTINUE these medications which have CHANGED   Details  Fluticasone-Salmeterol (ADVAIR) 250-50 MCG/DOSE AEPB Inhale 1 puff into the lungs 2 (two) times daily., Starting 07/07/2015, Until Discontinued, Normal      CONTINUE these medications which have NOT CHANGED   Details  acetaminophen (TYLENOL) 650 MG CR tablet Take 650 mg by mouth 3 (three) times daily., Until Discontinued, Historical Med    amLODipine (NORVASC) 5 MG tablet Take 5 mg by mouth daily., Until Discontinued, Historical Med    Calcium Carbonate-Vitamin D 600-400 MG-UNIT tablet Take 1 tablet by mouth every morning., Until Discontinued, Historical Med    montelukast (SINGULAIR)  10 MG tablet Take 10 mg by mouth every morning. , Until Discontinued, Historical Med    Multiple Vitamins-Minerals (CENTRUM SILVER PO) Take 1 tablet by mouth every morning., Until Discontinued, Historical Med    omeprazole (PRILOSEC) 20 MG capsule Take 20 mg by mouth daily.,  Until Discontinued, Historical Med    travoprost, benzalkonium, (TRAVATAN) 0.004 % ophthalmic solution Place 1 drop into both eyes at bedtime., Until Discontinued, Historical Med         Today    VITAL SIGNS:  Blood pressure 138/62, pulse 89, temperature 98 F (36.7 C), temperature source Oral, resp. rate 17, height 4\' 10"  (1.473 m), weight 77.111 kg (170 lb), SpO2 96 %.  I/O:  No intake or output data in the 24 hours ending 07/12/15 0930  PHYSICAL EXAMINATION:  Physical Exam  GENERAL:  81 y.o.-year-old patient lying in the bed with no acute distress.  LUNGS: Normal breath sounds bilaterally, no wheezing, rales,rhonchi or crepitation. No use of accessory muscles of respiration.  CARDIOVASCULAR: S1, S2 normal. No murmurs, rubs, or gallops.  ABDOMEN: Soft, non-tender, non-distended. Bowel sounds present. No organomegaly or mass.  NEUROLOGIC: Moves all 4 extremities. PSYCHIATRIC: The patient is alert and awake SKIN: No obvious rash, lesion, or ulcer.   DATA REVIEW:   CBC  Recent Labs Lab 07/06/15 1652  WBC 6.5  HGB 10.8*  HCT 34.3*  PLT 154    Chemistries   Recent Labs Lab 07/06/15 1652  NA 139  K 3.8  CL 106  CO2 24  GLUCOSE 128*  BUN 17  CREATININE 0.68  CALCIUM 8.7*  AST 31  ALT 24  ALKPHOS 88  BILITOT 0.3    Cardiac Enzymes  Recent Labs Lab 07/06/15 1652  TROPONINI <0.03    Microbiology Results  Results for orders placed or performed during the hospital encounter of 01/03/15  Culture, blood (single)     Status: None   Collection Time: 01/03/15 11:09 AM  Result Value Ref Range Status   Micro Text Report   Final       COMMENT                   NO GROWTH AEROBICALLY/ANAEROBICALLY IN 5 DAYS   ANTIBIOTIC                                                        RADIOLOGY:  No results found.    Follow up with PCP in 1 week.  Management plans discussed with the patient, family and they are in agreement.  CODE STATUS:   TOTAL TIME  TAKING CARE OF THIS PATIENT ON DAY OF DISCHARGE: more than 30 minutes.    Hillary Bow R M.D on 07/12/2015 at 9:30 AM  Between 7am to 6pm - Pager - [phone]  After 6pm go to [email or web address] - password [email or web address] [facility]  [facility]  Office  [phone]  CC: Primary care physician; No PCP Per Patient     Note: This dictation was prepared with Dragon dictation along with smaller phrase technology. Any transcriptional errors that result from this process are unintentional.

## 2015-08-29 DIAGNOSIS — G309 Alzheimer's disease, unspecified: Secondary | ICD-10-CM | POA: Insufficient documentation

## 2015-08-29 DIAGNOSIS — F0280 Dementia in other diseases classified elsewhere without behavioral disturbance: Secondary | ICD-10-CM | POA: Insufficient documentation

## 2017-12-13 DIAGNOSIS — R600 Localized edema: Secondary | ICD-10-CM | POA: Insufficient documentation

## 2018-04-29 ENCOUNTER — Emergency Department: Payer: Medicare Other

## 2018-04-29 ENCOUNTER — Encounter: Payer: Self-pay | Admitting: Emergency Medicine

## 2018-04-29 ENCOUNTER — Emergency Department
Admission: EM | Admit: 2018-04-29 | Discharge: 2018-04-29 | Disposition: A | Discharge status: Home / Self Care | Payer: Medicare Other | Attending: Emergency Medicine | Admitting: Emergency Medicine

## 2018-04-29 ENCOUNTER — Other Ambulatory Visit: Payer: Self-pay

## 2018-04-29 DIAGNOSIS — I509 Heart failure, unspecified: Secondary | ICD-10-CM | POA: Insufficient documentation

## 2018-04-29 DIAGNOSIS — Y93E8 Activity, other personal hygiene: Secondary | ICD-10-CM | POA: Insufficient documentation

## 2018-04-29 DIAGNOSIS — Y999 Unspecified external cause status: Secondary | ICD-10-CM | POA: Diagnosis not present

## 2018-04-29 DIAGNOSIS — I110 Hypertensive heart disease with heart failure: Secondary | ICD-10-CM | POA: Insufficient documentation

## 2018-04-29 DIAGNOSIS — Z96652 Presence of left artificial knee joint: Secondary | ICD-10-CM | POA: Insufficient documentation

## 2018-04-29 DIAGNOSIS — W08XXXA Fall from other furniture, initial encounter: Secondary | ICD-10-CM | POA: Diagnosis not present

## 2018-04-29 DIAGNOSIS — Z79899 Other long term (current) drug therapy: Secondary | ICD-10-CM | POA: Diagnosis not present

## 2018-04-29 DIAGNOSIS — Y92002 Bathroom of unspecified non-institutional (private) residence single-family (private) house as the place of occurrence of the external cause: Secondary | ICD-10-CM | POA: Diagnosis not present

## 2018-04-29 DIAGNOSIS — S8002XA Contusion of left knee, initial encounter: Secondary | ICD-10-CM | POA: Diagnosis not present

## 2018-04-29 DIAGNOSIS — J449 Chronic obstructive pulmonary disease, unspecified: Secondary | ICD-10-CM | POA: Diagnosis not present

## 2018-04-29 DIAGNOSIS — S8992XA Unspecified injury of left lower leg, initial encounter: Secondary | ICD-10-CM | POA: Diagnosis present

## 2018-04-29 MED ORDER — DICLOFENAC SODIUM 1 % TD GEL: 4.0000 g | Freq: Four times a day (QID) | TRANSDERMAL | 1 refills | Status: DC

## 2018-04-29 NOTE — ED Provider Notes (Signed)
[facility] Emergency Department Provider Note ____________________________________________  Time seen: Approximately 9:08 AM  I have reviewed the triage vital signs and the nursing notes.   HISTORY  Chief Complaint Knee Pain    HPI Brittney Wood is a 84 y.o. female who presents to the emergency department for evaluation and treatment of left knee pain post fall from the toilet. She states she landed directly on her left knee. She denies striking her head or loss of consciousness.  Past Medical History:  Diagnosis Date  . Cancer ([place])    skin  . CHF (congestive heart failure) ([place])   . COPD (chronic obstructive pulmonary disease) ([place])   . Hypertension     Patient Active Problem List   Diagnosis Date Noted  . COPD exacerbation ([place]) 07/06/2015    Past Surgical History:  Procedure Laterality Date  . ABDOMINAL HYSTERECTOMY    . JOINT REPLACEMENT      Prior to Admission medications   Medication Sig Start Date End Date Taking? Authorizing Provider  donepezil (ARICEPT) 10 MG tablet Take 10 mg by mouth at bedtime.   Yes [provider]  ipratropium-albuterol (DUONEB) 0.5-2.5 (3) MG/3ML SOLN Take 3 mLs by nebulization.   Yes [provider]  losartan (COZAAR) 100 MG tablet Take 100 mg by mouth daily.   Yes [provider]  memantine (NAMENDA) 10 MG tablet Take 10 mg by mouth 2 (two) times daily.   Yes [provider]  acetaminophen (TYLENOL) 650 MG CR tablet Take 650 mg by mouth 3 (three) times daily.    [provider]  albuterol (PROVENTIL HFA;VENTOLIN HFA) 108 (90 BASE) MCG/ACT inhaler Inhale 2 puffs into the lungs every 6 (six) hours as needed for wheezing or shortness of breath. 07/07/15   Hillary Bow, MD  amLODipine (NORVASC) 5 MG tablet Take 5 mg by mouth daily.    [provider]  Calcium Carbonate-Vitamin D 600-400 MG-UNIT tablet Take 1 tablet by mouth every morning.    [provider]  diclofenac sodium (VOLTAREN) 1 % GEL Apply 4 g topically 4 (four) times daily. 04/29/18   Nazim Kadlec B, FNP  Fluticasone-Salmeterol (ADVAIR) 250-50 MCG/DOSE AEPB Inhale 1 puff into the lungs 2 (two) times daily. 07/07/15   Hillary Bow, MD  levofloxacin (LEVAQUIN) 250 MG tablet Take 1 tablet (250 mg total) by mouth daily. 07/07/15   Hillary Bow, MD  montelukast (SINGULAIR) 10 MG tablet Take 10 mg by mouth every morning.     [provider]  Multiple Vitamins-Minerals (CENTRUM SILVER PO) Take 1 tablet by mouth every morning.    [provider]  omeprazole (PRILOSEC) 20 MG capsule Take 20 mg by mouth daily.    [provider]  predniSONE (DELTASONE) 50 MG tablet Take 1 tablet (50 mg total) by mouth daily with breakfast. 07/07/15   Hillary Bow, MD  tiotropium (SPIRIVA HANDIHALER) 18 MCG inhalation capsule Place 1 capsule (18 mcg total) into inhaler and inhale daily. 07/07/15   Hillary Bow, MD  travoprost, benzalkonium, (TRAVATAN) 0.004 % ophthalmic solution Place 1 drop into both eyes at bedtime.    [provider]    Allergies Morphine and related  Family History  Problem Relation Age of Onset  . Hypertension Other     Social History Social History   Tobacco Use  . Smoking status: Never Smoker  . Smokeless tobacco: Never Used  Substance Use Topics  . Alcohol use: No  . Drug use: No  Review of Systems Constitutional: Negative for fever. Cardiovascular: Negative for chest pain. Respiratory: Negative for shortness of breath. Musculoskeletal: Positive for left knee pain. Skin: Positive for left knee contusion.  Neurological: Negative for decrease in sensation  ____________________________________________   PHYSICAL EXAM:  VITAL SIGNS: ED Triage Vitals  Enc Vitals Group     BP 04/29/18 0858 (!) 114/56     Pulse Rate 04/29/18 0858 74     Resp 04/29/18 0858 18     Temp 04/29/18 0858 98.8 F (37.1 C)      Temp Source 04/29/18 0858 Oral     SpO2 04/29/18 0858 97 %     Weight --      Height --      Head Circumference --      Peak Flow --      Pain Score 04/29/18 0854 4     Pain Loc --      Pain Edu? --      Excl. in [place]? --     Constitutional: Alert and oriented. Well appearing and in no acute distress. Eyes: Conjunctivae are clear without discharge or drainage Head: Atraumatic Neck: Supple. NEXUS criteria negative. Respiratory: No cough. Respirations are even and unlabored. Musculoskeletal: Limited flexion of the left knee due to pain. No laxity on exam. Neurologic: Awake, alert, and oriented.  Skin: Intact over the left knee with prepatellar ecchymosis  Psychiatric: Affect and behavior are appropriate.  ____________________________________________   LABS (all labs ordered are listed, but only abnormal results are displayed)  Labs Reviewed - No data to display ____________________________________________  RADIOLOGY  Image of the left knee shows no acute findings. Hardware appears intact. Image reviewed by me but interpreted by radiology. ____________________________________________   PROCEDURES  Procedures  ____________________________________________   INITIAL IMPRESSION / ASSESSMENT AND PLAN / ED COURSE  Brittney Wood is a 84 y.o. who presents to the emergency department for treatment and evaluation of left knee pain. Jones wrap applied. She has a walker at home and a Psychologist, sport and exercise. She will follow up with orthopedics. She was advised to return to the ER for symptoms of concern if unable to schedule an appointment with orthopedics or primary care.  Medications - No data to display  Pertinent labs & imaging results that were available during my care of the patient were reviewed by me and considered in my medical decision making (see chart for details).  _________________________________________   FINAL CLINICAL IMPRESSION(S) / ED DIAGNOSES  Final diagnoses:   Contusion of left knee, initial encounter    ED Discharge Orders         Ordered    diclofenac sodium (VOLTAREN) 1 % GEL  4 times daily     04/29/18 0937           If controlled substance prescribed during this visit, 12 month history viewed on the [place] prior to issuing an initial prescription for Schedule II or III opiod.    Victorino Dike, FNP 05/01/18 0830    Arta Silence, MD 05/04/18 [phone]

## 2018-04-29 NOTE — ED Triage Notes (Signed)
Brought in via ems from home s/p fall  States she fell getting off toilet  Landed on her left knee  Bruising and swelling noted to knee

## 2018-04-29 NOTE — Discharge Instructions (Signed)
Ice 20 minutes per hour while awake. Follow up with orthopedics for symptoms that are not improving over the week. Return to the ER for symptoms that change or worsen if unable to schedule an appointment.

## 2020-03-14 DIAGNOSIS — Z8614 Personal history of Methicillin resistant Staphylococcus aureus infection: Secondary | ICD-10-CM

## 2020-03-14 HISTORY — DX: Personal history of Methicillin resistant Staphylococcus aureus infection: Z86.14

## 2020-03-29 ENCOUNTER — Inpatient Hospital Stay
Admission: EM | Admit: 2020-03-29 | Discharge: 2020-04-03 | DRG: 871 | Disposition: A | Discharge status: Skilled Nursing Facility | Payer: Medicare Other | Attending: Internal Medicine | Admitting: Internal Medicine

## 2020-03-29 ENCOUNTER — Emergency Department: Payer: Medicare Other

## 2020-03-29 ENCOUNTER — Other Ambulatory Visit: Payer: Self-pay

## 2020-03-29 ENCOUNTER — Inpatient Hospital Stay: Payer: Medicare Other

## 2020-03-29 DIAGNOSIS — I5032 Chronic diastolic (congestive) heart failure: Secondary | ICD-10-CM | POA: Diagnosis present

## 2020-03-29 DIAGNOSIS — J449 Chronic obstructive pulmonary disease, unspecified: Secondary | ICD-10-CM | POA: Diagnosis present

## 2020-03-29 DIAGNOSIS — N200 Calculus of kidney: Secondary | ICD-10-CM

## 2020-03-29 DIAGNOSIS — J9601 Acute respiratory failure with hypoxia: Secondary | ICD-10-CM | POA: Diagnosis present

## 2020-03-29 DIAGNOSIS — F0280 Dementia in other diseases classified elsewhere without behavioral disturbance: Secondary | ICD-10-CM

## 2020-03-29 DIAGNOSIS — I110 Hypertensive heart disease with heart failure: Secondary | ICD-10-CM | POA: Diagnosis present

## 2020-03-29 DIAGNOSIS — Z20822 Contact with and (suspected) exposure to covid-19: Secondary | ICD-10-CM | POA: Diagnosis present

## 2020-03-29 DIAGNOSIS — N130 Hydronephrosis with ureteropelvic junction obstruction: Secondary | ICD-10-CM | POA: Diagnosis not present

## 2020-03-29 DIAGNOSIS — N1330 Unspecified hydronephrosis: Secondary | ICD-10-CM | POA: Diagnosis not present

## 2020-03-29 DIAGNOSIS — Z7982 Long term (current) use of aspirin: Secondary | ICD-10-CM | POA: Diagnosis not present

## 2020-03-29 DIAGNOSIS — I081 Rheumatic disorders of both mitral and tricuspid valves: Secondary | ICD-10-CM | POA: Diagnosis present

## 2020-03-29 DIAGNOSIS — N179 Acute kidney failure, unspecified: Secondary | ICD-10-CM | POA: Diagnosis present

## 2020-03-29 DIAGNOSIS — A4102 Sepsis due to Methicillin resistant Staphylococcus aureus: Principal | ICD-10-CM | POA: Diagnosis present

## 2020-03-29 DIAGNOSIS — Z885 Allergy status to narcotic agent status: Secondary | ICD-10-CM | POA: Diagnosis not present

## 2020-03-29 DIAGNOSIS — R6521 Severe sepsis with septic shock: Secondary | ICD-10-CM

## 2020-03-29 DIAGNOSIS — R7881 Bacteremia: Secondary | ICD-10-CM

## 2020-03-29 DIAGNOSIS — Z8249 Family history of ischemic heart disease and other diseases of the circulatory system: Secondary | ICD-10-CM | POA: Diagnosis not present

## 2020-03-29 DIAGNOSIS — Z96653 Presence of artificial knee joint, bilateral: Secondary | ICD-10-CM | POA: Diagnosis present

## 2020-03-29 DIAGNOSIS — R4182 Altered mental status, unspecified: Secondary | ICD-10-CM | POA: Diagnosis present

## 2020-03-29 DIAGNOSIS — A419 Sepsis, unspecified organism: Secondary | ICD-10-CM | POA: Diagnosis not present

## 2020-03-29 DIAGNOSIS — Z66 Do not resuscitate: Secondary | ICD-10-CM | POA: Diagnosis present

## 2020-03-29 DIAGNOSIS — R0600 Dyspnea, unspecified: Secondary | ICD-10-CM

## 2020-03-29 DIAGNOSIS — D72829 Elevated white blood cell count, unspecified: Secondary | ICD-10-CM

## 2020-03-29 DIAGNOSIS — I447 Left bundle-branch block, unspecified: Secondary | ICD-10-CM | POA: Diagnosis present

## 2020-03-29 DIAGNOSIS — Z79899 Other long term (current) drug therapy: Secondary | ICD-10-CM | POA: Diagnosis not present

## 2020-03-29 DIAGNOSIS — Z888 Allergy status to other drugs, medicaments and biological substances status: Secondary | ICD-10-CM

## 2020-03-29 DIAGNOSIS — F0390 Unspecified dementia without behavioral disturbance: Secondary | ICD-10-CM | POA: Diagnosis present

## 2020-03-29 DIAGNOSIS — N136 Pyonephrosis: Secondary | ICD-10-CM | POA: Diagnosis present

## 2020-03-29 DIAGNOSIS — G309 Alzheimer's disease, unspecified: Secondary | ICD-10-CM | POA: Diagnosis not present

## 2020-03-29 DIAGNOSIS — B9562 Methicillin resistant Staphylococcus aureus infection as the cause of diseases classified elsewhere: Secondary | ICD-10-CM | POA: Diagnosis not present

## 2020-03-29 DIAGNOSIS — Q6211 Congenital occlusion of ureteropelvic junction: Secondary | ICD-10-CM

## 2020-03-29 DIAGNOSIS — G9341 Metabolic encephalopathy: Secondary | ICD-10-CM | POA: Diagnosis present

## 2020-03-29 DIAGNOSIS — N201 Calculus of ureter: Secondary | ICD-10-CM

## 2020-03-29 DIAGNOSIS — R6520 Severe sepsis without septic shock: Secondary | ICD-10-CM | POA: Diagnosis not present

## 2020-03-29 DIAGNOSIS — I10 Essential (primary) hypertension: Secondary | ICD-10-CM

## 2020-03-29 DIAGNOSIS — N132 Hydronephrosis with renal and ureteral calculous obstruction: Secondary | ICD-10-CM | POA: Diagnosis not present

## 2020-03-29 HISTORY — PX: IR NEPHROSTOMY PLACEMENT RIGHT: IMG6064

## 2020-03-29 LAB — BLOOD CULTURE ID PANEL (REFLEXED)

## 2020-03-29 LAB — COMPREHENSIVE METABOLIC PANEL
ALT: 22 U/L (ref 0–44)
AST: 23 U/L (ref 15–41)
Albumin: 3.1 g/dL — ABNORMAL LOW (ref 3.5–5.0)
Alkaline Phosphatase: 72 U/L (ref 38–126)
Anion gap: 8 (ref 5–15)
BUN: 27 mg/dL — ABNORMAL HIGH (ref 8–23)
CO2: 21 mmol/L — ABNORMAL LOW (ref 22–32)
Calcium: 8.1 mg/dL — ABNORMAL LOW (ref 8.9–10.3)
Chloride: 110 mmol/L (ref 98–111)
Creatinine, Ser: 1.4 mg/dL — ABNORMAL HIGH (ref 0.44–1.00)
GFR calc Af Amer: 38 mL/min — ABNORMAL LOW (ref >60)
GFR calc non Af Amer: 33 mL/min — ABNORMAL LOW (ref >60)
Glucose, Bld: 120 mg/dL — ABNORMAL HIGH (ref 70–99)
Potassium: 3.6 mmol/L (ref 3.5–5.1)
Sodium: 139 mmol/L (ref 135–145)
Total Bilirubin: 1 mg/dL (ref 0.3–1.2)
Total Protein: 6.3 g/dL — ABNORMAL LOW (ref 6.5–8.1)

## 2020-03-29 LAB — URINALYSIS, ROUTINE W REFLEX MICROSCOPIC
Bacteria, UA: NONE SEEN
Bilirubin Urine: NEGATIVE
Glucose, UA: NEGATIVE mg/dL
Ketones, ur: NEGATIVE mg/dL
Nitrite: NEGATIVE
Protein, ur: 100 mg/dL — AB
RBC / HPF: >50 RBC/hpf — ABNORMAL HIGH (ref 0–5)
Specific Gravity, Urine: 1.017 (ref 1.005–1.030)
WBC, UA: >50 WBC/hpf — ABNORMAL HIGH (ref 0–5)
pH: 7 (ref 5.0–8.0)

## 2020-03-29 LAB — CBC WITH DIFFERENTIAL/PLATELET
Abs Immature Granulocytes: 0.08 10*3/uL — ABNORMAL HIGH (ref 0.00–0.07)
Basophils Absolute: 0.1 10*3/uL (ref 0.0–0.1)
Basophils Relative: 0 %
Eosinophils Absolute: 0 10*3/uL (ref 0.0–0.5)
Eosinophils Relative: 0 %
HCT: 40.3 % (ref 36.0–46.0)
Hemoglobin: 13.6 g/dL (ref 12.0–15.0)
Immature Granulocytes: 1 %
Lymphocytes Relative: 2 %
Lymphs Abs: 0.3 10*3/uL — ABNORMAL LOW (ref 0.7–4.0)
MCH: 32.8 pg (ref 26.0–34.0)
MCHC: 33.7 g/dL (ref 30.0–36.0)
MCV: 97.1 fL (ref 80.0–100.0)
Monocytes Absolute: 0.9 10*3/uL (ref 0.1–1.0)
Monocytes Relative: 6 %
Neutro Abs: 13.9 10*3/uL — ABNORMAL HIGH (ref 1.7–7.7)
Neutrophils Relative %: 91 %
Platelets: 77 10*3/uL — ABNORMAL LOW (ref 150–400)
RBC: 4.15 MIL/uL (ref 3.87–5.11)
RDW: 13.2 % (ref 11.5–15.5)
WBC: 15.3 10*3/uL — ABNORMAL HIGH (ref 4.0–10.5)
nRBC: 0 % (ref 0.0–0.2)

## 2020-03-29 LAB — PROTIME-INR
INR: 1.3 — ABNORMAL HIGH (ref 0.8–1.2)
Prothrombin Time: 15.9 seconds — ABNORMAL HIGH (ref 11.4–15.2)

## 2020-03-29 LAB — APTT: aPTT: 32 seconds (ref 24–36)

## 2020-03-29 LAB — SARS CORONAVIRUS 2 BY RT PCR (HOSPITAL ORDER, PERFORMED IN ~~LOC~~ HOSPITAL LAB): SARS Coronavirus 2: NEGATIVE

## 2020-03-29 LAB — LACTIC ACID, PLASMA
Lactic Acid, Venous: 2.6 mmol/L (ref 0.5–1.9)
Lactic Acid, Venous: 2.9 mmol/L (ref 0.5–1.9)

## 2020-03-29 LAB — MRSA PCR SCREENING: MRSA by PCR: POSITIVE — AB

## 2020-03-29 MED ORDER — VANCOMYCIN HCL 500 MG/100ML IV SOLN
500.0000 mg | Freq: Once | INTRAVENOUS | Status: DC
  Filled 2020-03-29: qty 100

## 2020-03-29 MED ORDER — VANCOMYCIN HCL 750 MG/150ML IV SOLN
750.0000 mg | INTRAVENOUS | Status: DC
  Administered 2020-03-30 – 2020-03-31 (×2): 750 mg via INTRAVENOUS
  Filled 2020-03-29 (×3): qty 150

## 2020-03-29 MED ORDER — FUROSEMIDE 10 MG/ML IJ SOLN
40.0000 mg | Freq: Once | INTRAMUSCULAR | Status: AC
  Administered 2020-03-29: 40 mg via INTRAVENOUS
  Filled 2020-03-29: qty 4

## 2020-03-29 MED ORDER — SODIUM CHLORIDE 0.9% FLUSH: 3.0000 mL | INTRAVENOUS | Status: DC | PRN

## 2020-03-29 MED ORDER — MIDAZOLAM HCL 5 MG/5ML IJ SOLN
INTRAMUSCULAR | Status: AC | PRN
  Administered 2020-03-29: 1 mg via INTRAVENOUS

## 2020-03-29 MED ORDER — SODIUM CHLORIDE 0.9 % IV SOLN: 250.0000 mL | INTRAVENOUS | Status: DC | PRN

## 2020-03-29 MED ORDER — IODIXANOL 320 MG/ML IV SOLN
50.0000 mL | Freq: Once | INTRAVENOUS | Status: AC | PRN
  Administered 2020-03-29: 10 mL

## 2020-03-29 MED ORDER — SODIUM CHLORIDE 0.9 % IV BOLUS
500.0000 mL | Freq: Once | INTRAVENOUS | Status: AC
  Administered 2020-03-29: 500 mL via INTRAVENOUS

## 2020-03-29 MED ORDER — SODIUM CHLORIDE 0.9 % IV BOLUS (SEPSIS)
1000.0000 mL | Freq: Once | INTRAVENOUS | Status: AC
  Administered 2020-03-29: 1000 mL via INTRAVENOUS

## 2020-03-29 MED ORDER — SODIUM CHLORIDE 0.9 % IV SOLN
2.0000 g | Freq: Once | INTRAVENOUS | Status: AC
  Administered 2020-03-29: 2 g via INTRAVENOUS
  Filled 2020-03-29: qty 2

## 2020-03-29 MED ORDER — SODIUM CHLORIDE 0.9 % IV BOLUS (SEPSIS)
250.0000 mL | Freq: Once | INTRAVENOUS | Status: AC
  Administered 2020-03-29: 250 mL via INTRAVENOUS

## 2020-03-29 MED ORDER — DOCUSATE SODIUM 100 MG PO CAPS: 100.0000 mg | ORAL_CAPSULE | Freq: Two times a day (BID) | ORAL | Status: DC | PRN

## 2020-03-29 MED ORDER — MIDAZOLAM HCL 5 MG/5ML IJ SOLN
INTRAMUSCULAR | Status: AC
  Filled 2020-03-29: qty 5

## 2020-03-29 MED ORDER — SODIUM CHLORIDE 0.9% FLUSH
5.0000 mL | Freq: Three times a day (TID) | INTRAVENOUS | Status: DC
  Administered 2020-03-30 – 2020-04-03 (×12): 5 mL

## 2020-03-29 MED ORDER — SODIUM CHLORIDE 0.9% FLUSH: 3.0000 mL | Freq: Two times a day (BID) | INTRAVENOUS | Status: DC

## 2020-03-29 MED ORDER — VANCOMYCIN HCL IN DEXTROSE 1-5 GM/200ML-% IV SOLN
1000.0000 mg | Freq: Once | INTRAVENOUS | Status: AC
  Administered 2020-03-29: 1000 mg via INTRAVENOUS
  Filled 2020-03-29: qty 200

## 2020-03-29 MED ORDER — ACETAMINOPHEN 325 MG PO TABS
650.0000 mg | ORAL_TABLET | Freq: Once | ORAL | Status: AC
  Administered 2020-03-29: 650 mg via ORAL
  Filled 2020-03-29: qty 2

## 2020-03-29 MED ORDER — FAMOTIDINE IN NACL 20-0.9 MG/50ML-% IV SOLN
20.0000 mg | INTRAVENOUS | Status: DC
  Administered 2020-03-30 – 2020-04-02 (×4): 20 mg via INTRAVENOUS
  Filled 2020-03-29 (×4): qty 50

## 2020-03-29 MED ORDER — ENOXAPARIN SODIUM 30 MG/0.3ML ~~LOC~~ SOLN
30.0000 mg | SUBCUTANEOUS | Status: DC
  Administered 2020-03-29 – 2020-03-30 (×2): 30 mg via SUBCUTANEOUS
  Filled 2020-03-29 (×2): qty 0.3

## 2020-03-29 MED ORDER — POLYETHYLENE GLYCOL 3350 17 G PO PACK: 17.0000 g | PACK | Freq: Every day | ORAL | Status: DC | PRN

## 2020-03-29 MED ORDER — FENTANYL CITRATE (PF) 100 MCG/2ML IJ SOLN
INTRAMUSCULAR | Status: AC
  Filled 2020-03-29: qty 2

## 2020-03-29 MED ORDER — ONDANSETRON HCL 4 MG/2ML IJ SOLN: 4.0000 mg | Freq: Four times a day (QID) | INTRAMUSCULAR | Status: DC | PRN

## 2020-03-29 MED ORDER — METRONIDAZOLE IN NACL 5-0.79 MG/ML-% IV SOLN
500.0000 mg | Freq: Once | INTRAVENOUS | Status: AC
  Administered 2020-03-29: 500 mg via INTRAVENOUS
  Filled 2020-03-29: qty 100

## 2020-03-29 MED ORDER — ACETAMINOPHEN 325 MG PO TABS
650.0000 mg | ORAL_TABLET | ORAL | Status: DC | PRN
  Administered 2020-03-30 – 2020-04-03 (×5): 650 mg via ORAL
  Filled 2020-03-29 (×5): qty 2

## 2020-03-29 MED ORDER — IPRATROPIUM-ALBUTEROL 0.5-2.5 (3) MG/3ML IN SOLN
3.0000 mL | RESPIRATORY_TRACT | Status: DC | PRN
  Administered 2020-03-29: 3 mL via RESPIRATORY_TRACT
  Filled 2020-03-29: qty 3

## 2020-03-29 MED ORDER — SODIUM CHLORIDE 0.9 % IV SOLN
2.0000 g | INTRAVENOUS | Status: DC
  Administered 2020-03-29: 2 g via INTRAVENOUS
  Filled 2020-03-29 (×2): qty 20

## 2020-03-29 NOTE — Progress Notes (Signed)
Chief Complaint: Patient was seen in consultation today for left PCN  Referring Physician(s): Dr. Ellender Hose  Supervising Physician: Aletta Edouard  Patient Status: [place] - In-pt  History of Present Illness: Brittney Wood is a 86 y.o. female being admitted with Urosepsis secondary to obstructing right ureteral stone with hydronephrosis. Urology consulted and requests IR place (R)PCN for decompression. PMHx, meds, labs, imaging, allergies reviewed. Has been NPO today. Pt seen in ED, no family present.    Past Medical History:  Diagnosis Date  . Cancer ([place])    skin  . CHF (congestive heart failure) ([place])   . COPD (chronic obstructive pulmonary disease) ([place])   . Hypertension     Past Surgical History:  Procedure Laterality Date  . ABDOMINAL HYSTERECTOMY    . JOINT REPLACEMENT      Allergies: Morphine and related  Medications:  Current Facility-Administered Medications:  .  0.9 %  sodium chloride infusion, 250 mL, Intravenous, PRN, Flora Lipps, MD .  acetaminophen (TYLENOL) tablet 650 mg, 650 mg, Oral, Q4H PRN, Flora Lipps, MD .  docusate sodium (COLACE) capsule 100 mg, 100 mg, Oral, BID PRN, Flora Lipps, MD .  famotidine (PEPCID) IVPB 20 mg premix, 20 mg, Intravenous, Q24H, Kasa, Kurian, MD .  ondansetron (ZOFRAN) injection 4 mg, 4 mg, Intravenous, Q6H PRN, Mortimer Fries, Kurian, MD .  polyethylene glycol (MIRALAX / GLYCOLAX) packet 17 g, 17 g, Oral, Daily PRN, Mortimer Fries, Kurian, MD .  sodium chloride flush (NS) 0.9 % injection 3 mL, 3 mL, Intravenous, Q12H, Kasa, Kurian, MD .  sodium chloride flush (NS) 0.9 % injection 3 mL, 3 mL, Intravenous, PRN, Flora Lipps, MD  Current Outpatient Medications:  .  calcium citrate-vitamin D (CITRACAL+D) 315-200 MG-UNIT tablet, Take by mouth., Disp: , Rfl:  .  Cholecalciferol 25 MCG (1000 UT) tablet, Take by mouth., Disp: , Rfl:  .  cyanocobalamin 1000 MCG tablet, Take by mouth., Disp: , Rfl:  .  donepezil (ARICEPT) 10 MG tablet, Take 10  mg by mouth at bedtime., Disp: , Rfl:  .  latanoprost (XALATAN) 0.005 % ophthalmic solution, 1 drop at bedtime., Disp: , Rfl:  .  memantine (NAMENDA) 10 MG tablet, Take 10 mg by mouth 2 (two) times daily., Disp: , Rfl:  .  montelukast (SINGULAIR) 10 MG tablet, Take 10 mg by mouth every morning. , Disp: , Rfl:  .  omeprazole (PRILOSEC) 20 MG capsule, Take 20 mg by mouth daily., Disp: , Rfl:  .  acetaminophen (TYLENOL) 650 MG CR tablet, Take 650 mg by mouth 3 (three) times daily., Disp: , Rfl:  .  amLODipine (NORVASC) 5 MG tablet, Take 5 mg by mouth daily. (Patient not taking: Reported on 03/29/2020), Disp: , Rfl:  .  aspirin 81 MG EC tablet, Take by mouth., Disp: , Rfl:  .  Calcium Carbonate-Vitamin D 600-400 MG-UNIT tablet, Take 1 tablet by mouth every morning., Disp: , Rfl:  .  diclofenac sodium (VOLTAREN) 1 % GEL, Apply 4 g topically 4 (four) times daily., Disp: 1 Tube, Rfl: 1 .  loperamide (IMODIUM A-D) 2 MG tablet, Take by mouth., Disp: , Rfl:  .  losartan (COZAAR) 100 MG tablet, Take 100 mg by mouth daily., Disp: , Rfl:  .  Multiple Vitamin (ONE DAILY) tablet, Take by mouth., Disp: , Rfl:  .  Multiple Vitamins-Minerals (CENTRUM SILVER PO), Take 1 tablet by mouth every morning., Disp: , Rfl:     Family History  Problem Relation Age of Onset  . Hypertension Other  Social History   Socioeconomic History  . Marital status: Single    Spouse name: Not on file  . Number of children: Not on file  . Years of education: Not on file  . Highest education level: Not on file  Occupational History  . Not on file  Tobacco Use  . Smoking status: Never Smoker  . Smokeless tobacco: Never Used  Substance and Sexual Activity  . Alcohol use: No  . Drug use: No  . Sexual activity: Not on file  Other Topics Concern  . Not on file  Social History Narrative  . Not on file   Social Determinants of Health   Financial Resource Strain:   . Difficulty of Paying Living Expenses:   Food  Insecurity:   . Worried About Charity fundraiser in the Last Year:   . Arboriculturist in the Last Year:   Transportation Needs:   . Film/video editor (Medical):   Marland Kitchen Lack of Transportation (Non-Medical):   Physical Activity:   . Days of Exercise per Week:   . Minutes of Exercise per Session:   Stress:   . Feeling of Stress :   Social Connections:   . Frequency of Communication with Friends and Family:   . Frequency of Social Gatherings with Friends and Family:   . Attends Religious Services:   . Active Member of Clubs or Organizations:   . Attends Archivist Meetings:   Marland Kitchen Marital Status:      Review of Systems: A 12 point ROS discussed and pertinent positives are indicated in the HPI above.  All other systems are negative.  Review of Systems  Vital Signs: BP (!) 105/47   Pulse 75   Temp 99.2 F (37.3 C) (Oral)   Resp 20   Ht 5\' 2"  (1.575 m)   Wt 67.1 kg   SpO2 97%   BMI 27.06 kg/m   Physical Exam Constitutional:      Appearance: She is ill-appearing.  HENT:     Mouth/Throat:     Mouth: Mucous membranes are moist.     Pharynx: Oropharynx is clear.  Cardiovascular:     Rate and Rhythm: Normal rate and regular rhythm.     Heart sounds: Normal heart sounds.  Pulmonary:     Effort: Pulmonary effort is normal. No respiratory distress.     Breath sounds: Normal breath sounds.  Abdominal:     General: Abdomen is flat. There is no distension.     Palpations: Abdomen is soft.     Tenderness: There is no abdominal tenderness.  Skin:    General: Skin is warm and dry.  Neurological:     General: No focal deficit present.     Mental Status: She is alert and oriented to person, place, and time. Mental status is at baseline.  Psychiatric:        Thought Content: Thought content normal.        Judgment: Judgment normal.      Imaging: CT ABDOMEN PELVIS WO CONTRAST  Result Date: 03/29/2020 CLINICAL DATA:  Sepsis. EXAM: CT ABDOMEN AND PELVIS WITHOUT  CONTRAST TECHNIQUE: Multidetector CT imaging of the abdomen and pelvis was performed following the standard protocol without IV contrast. COMPARISON:  None. FINDINGS: Lower chest: No acute abnormality. Bibasilar atelectasis/scarring. Emphysema in the medial right middle lobe. Hepatobiliary: Nodular liver contour. No focal liver abnormality is seen. Layering high-density sludge or small stones in the gallbladder neck. No gallbladder wall thickening  or biliary dilatation. Pancreas: Unremarkable. No pancreatic ductal dilatation or surrounding inflammatory changes. Spleen: Moderate splenomegaly. Adrenals/Urinary Tract: 1.5 cm left adrenal adenoma. Right adrenal gland is unremarkable. 8 mm calculus at the right UPJ with resultant mild to moderate right hydronephrosis. Additional nonobstructive right renal calculi. 3.4 cm simple cyst in the upper pole of the right kidney. Subcentimeter low-density lesion in the midpole of the left kidney is too small to characterize. No left hydronephrosis. Multiple bladder diverticula. Stomach/Bowel: Small hiatal hernia. The stomach is otherwise within normal limits. No bowel wall thickening, distention, or surrounding inflammatory changes. There are a few colonic diverticula. Diminutive or absent appendix. Vascular/Lymphatic: Aortic atherosclerosis. No enlarged abdominal or pelvic lymph nodes. Reproductive: Status post hysterectomy. No adnexal masses. Other: Small fat containing umbilical hernia. No free fluid or pneumoperitoneum. Musculoskeletal: No acute or significant osseous findings. Prior right total hip arthroplasty. IMPRESSION: 1. 8 mm calculus at the right UPJ with resultant mild to moderate right hydronephrosis. 2. Additional nonobstructive right nephrolithiasis. 3. Cirrhosis with sequelae of portal hypertension including moderate splenomegaly. 4. Cholelithiasis versus sludge. 5. 1.5 cm left adrenal adenoma. 6. Aortic Atherosclerosis (ICD10-I70.0) and Emphysema (ICD10-J43.9).  Electronically Signed   By: Titus Dubin M.D.   On: 03/29/2020 14:43   DG Chest Port 1 View  Result Date: 03/29/2020 CLINICAL DATA:  86 year old female with history of sepsis. EXAM: PORTABLE CHEST 1 VIEW COMPARISON:  Chest x-ray 07/06/2015. FINDINGS: Lung volumes are low. No consolidative airspace disease. No pleural effusions. No pneumothorax. No pulmonary nodule or mass noted. Pulmonary vasculature and the cardiomediastinal silhouette are within normal limits. Atherosclerotic calcifications in the thoracic aorta. Advanced degenerative changes of osteoarthritis are noted in shoulder joints bilaterally. IMPRESSION: 1. Low lung volumes without radiographic evidence of acute cardiopulmonary disease. 2. Aortic atherosclerosis. Electronically Signed   By: Vinnie Langton M.D.   On: 03/29/2020 11:36    Labs:  CBC: Recent Labs    03/29/20 1104  WBC 15.3*  HGB 13.6  HCT 40.3  PLT 77*    COAGS: Recent Labs    03/29/20 1104  INR 1.3*  APTT 32    BMP: Recent Labs    03/29/20 1104  NA 139  K 3.6  CL 110  CO2 21*  GLUCOSE 120*  BUN 27*  CALCIUM 8.1*  CREATININE 1.40*  GFRNONAA 33*  GFRAA 38*    LIVER FUNCTION TESTS: Recent Labs    03/29/20 1104  BILITOT 1.0  AST 23  ALT 22  ALKPHOS 72  PROT 6.3*  ALBUMIN 3.1*    TUMOR MARKERS: No results for input(s): AFPTM, CEA, CA199, CHROMGRNA in the last 8760 hours.  Assessment and Plan: (R)hydronephrosis from UPJ stone Urosepsis from above Plan for (R)PCN placement for decompression Labs reviewed. Risks and benefits of right PCN placement was discussed with the patient including, but not limited to, infection, bleeding, significant bleeding causing loss or decrease in renal function or damage to adjacent structures.   All of the patient's questions were answered, patient is agreeable to proceed.  Consent signed and in chart.    Thank you for this interesting consult.  I greatly enjoyed meeting Brittney Wood and look  forward to participating in their care.  A copy of this report was sent to the requesting provider on this date.  Electronically Signed: Ascencion Dike, PA-C 03/29/2020, 4:05 PM   I spent a total of 20 minutes in face to face in clinical consultation, greater than 50% of which was counseling/coordinating care for right PCN

## 2020-03-29 NOTE — Progress Notes (Addendum)
Pharmacy Antibiotic Note  Brittney Wood is a 86 y.o. female admitted on 03/29/2020 with sepsis.  Pharmacy has been consulted for vancomycin dosing. Patient already received vanc 1g at 1130 at 07/16.  Plan: Will start vanc 750 mg IV q24h to start at 07/17 @ 0200.  Initial concentration is roughly 21.3 mcg/mL per Vd of 46.9L w/ a dose of vanc 1g at 07/16 @ 1130. Patient will be at a concentration of 15 mcg/mL in roughly 15 hours (07/17 at 0200). Will continue to monitor renal function and adjust per changes in renal function.  Ke 0.02349 T1/2 30 ~ 24 hrs Vd 46.9L (67 kg x 0.7L/kg) Initial concentration 21.3 mcg/mL  Height: 5\' 2"  (157.5 cm) Weight: 67.1 kg (147 lb 14.9 oz) IBW/kg (Calculated) : 50.1  Temp (24hrs), Avg:99.2 F (37.3 C), Min:97.7 F (36.5 C), Max:101.7 F (38.7 C)  Recent Labs  Lab 03/29/20 1104 03/29/20 1232  WBC 15.3*  --   CREATININE 1.40*  --   LATICACIDVEN 2.6* 2.9*    Estimated Creatinine Clearance: 23 mL/min (A) (by C-G formula based on SCr of 1.4 mg/dL (H)).    Allergies  Allergen Reactions   Morphine And Related Other (See Comments)    Hallucinations    Thank you for allowing pharmacy to be a part of this patients care.  Tobie Lords, PharmD, BCPS Clinical Pharmacist 03/29/2020 9:54 PM

## 2020-03-29 NOTE — Procedures (Signed)
Interventional Radiology Procedure Note  Procedure: Right nephrostomy tube placement  Complications: None  Estimated Blood Loss: < 10 mL  Findings: Right renal collecting system with grossly purulent urine. Sample sent for culture.  10 Fr PCN placed and formed in renal pelvis. Attached to gravity bag.  Venetia Night. Kathlene Cote, M.D Pager:  [phone]

## 2020-03-29 NOTE — ED Notes (Signed)
Pt laying in bed on cardiac, bp and pulse ox monitor. Pt states she is at the hospital for a urine infection. Pt states she does not know what year it is. Pt denies pain.

## 2020-03-29 NOTE — Progress Notes (Signed)
The Clinical status was relayed to family in detail. Daughter at bedside Severe sepsis from infected kidney stone  Updated and notified of patients medical condition.  Explained to family course of therapy and the modalities   Family understands the situation.  They have consented and agreed to DNR/DNI   Family are satisfied with Plan of action and management. All questions answered    Corrin Parker, M.D.  [facility]  Medical Director [place] Director [facility] Cardio-Pulmonary Department

## 2020-03-29 NOTE — ED Notes (Signed)
EDP at bedside  

## 2020-03-29 NOTE — ED Notes (Signed)
Pt taken to bay for placement of nephrostomy tube. Report given to ICU, RN. RN taking pt via stretcher on monitor and after tube placement, pt to be taken to the ICU by their staff.

## 2020-03-29 NOTE — ED Notes (Signed)
Pt to CT

## 2020-03-29 NOTE — Progress Notes (Signed)
Admitted from Vascular lab post nephrostomy insertion by interventional radiologist. Patient is pleasantly confused. CHG bath done. Purewic placed.

## 2020-03-29 NOTE — Progress Notes (Signed)
PHARMACY - PHYSICIAN COMMUNICATION CRITICAL VALUE ALERT - BLOOD CULTURE IDENTIFICATION (BCID)  Brittney Wood is an 86 y.o. female who presented to [facility] on 03/29/2020 with a chief complaint of sepsis found to have hydronephrosis s/t obstructing stone  Assessment:  Tmax 101.69F, HR 100's, RR 24 - 79'G, systolic 99 x 1, WBC 92.1, LA 2.6 >> 2.9, UA leukocytes +, WBC > 50, 2/4 GPC BCID Staph aureus Mec A+ (MRSA).  CXR IMPRESSION: Stable chronic interstitial prominence with potential superimposed mild edema. Patchy bibasilar atelectasis.  IR nephrostomy placement: IMPRESSION: Right percutaneous nephrostomy tube placement. There was return of grossly purulent urine. A sample was sent for culture analysis. A 10 French nephrostomy tube was placed and attached to gravity bag drainage.  CT abdomen/pelvis: IMPRESSION: 1. 8 mm calculus at the right UPJ with resultant mild to moderate right hydronephrosis. 2. Additional nonobstructive right nephrolithiasis. 3. Cirrhosis with sequelae of portal hypertension including moderate splenomegaly. 4. Cholelithiasis versus sludge. 5. 1.5 cm left adrenal adenoma. 6. Aortic Atherosclerosis (ICD10-I70.0) and Emphysema (ICD10-J43.9).  Name of physician (or Provider) Contacted: Harless Nakayama  Current antibiotics: ceftriaxone, vanc  Changes to prescribed antibiotics recommended:  Patient is on recommended antibiotics - No changes needed  Results for orders placed or performed during the hospital encounter of 03/29/20  Blood Culture ID Panel (Reflexed) (Collected: 03/29/2020 11:04 AM)  Result Value Ref Range   Enterococcus species NOT DETECTED NOT DETECTED   Listeria monocytogenes NOT DETECTED NOT DETECTED   Staphylococcus species DETECTED (A) NOT DETECTED   Staphylococcus aureus (BCID) DETECTED (A) NOT DETECTED   Methicillin resistance DETECTED (A) NOT DETECTED   Streptococcus species NOT DETECTED NOT DETECTED   Streptococcus agalactiae NOT  DETECTED NOT DETECTED   Streptococcus pneumoniae NOT DETECTED NOT DETECTED   Streptococcus pyogenes NOT DETECTED NOT DETECTED   Acinetobacter baumannii NOT DETECTED NOT DETECTED   Enterobacteriaceae species NOT DETECTED NOT DETECTED   Enterobacter cloacae complex NOT DETECTED NOT DETECTED   Escherichia coli NOT DETECTED NOT DETECTED   Klebsiella oxytoca NOT DETECTED NOT DETECTED   Klebsiella pneumoniae NOT DETECTED NOT DETECTED   Proteus species NOT DETECTED NOT DETECTED   Serratia marcescens NOT DETECTED NOT DETECTED   Haemophilus influenzae NOT DETECTED NOT DETECTED   Neisseria meningitidis NOT DETECTED NOT DETECTED   Pseudomonas aeruginosa NOT DETECTED NOT DETECTED   Candida albicans NOT DETECTED NOT DETECTED   Candida glabrata NOT DETECTED NOT DETECTED   Candida krusei NOT DETECTED NOT DETECTED   Candida parapsilosis NOT DETECTED NOT DETECTED   Candida tropicalis NOT DETECTED NOT DETECTED   Tobie Lords, PharmD, BCPS Clinical Pharmacist 03/29/2020  10:36 PM

## 2020-03-29 NOTE — Progress Notes (Signed)
Notified provider of need to order repeat lactic acid due to second LA was higher than the first.

## 2020-03-29 NOTE — Sedation Documentation (Signed)
Total conscious sedation: Versed 1.0 mg IV. Pt. Tolerated procedure well.

## 2020-03-29 NOTE — H&P (Signed)
Name: Brittney Wood MRN: [id number] DOB: [birth year]     CONSULTATION DATE: 03/29/2020  REFERRING MD :  Ellender Hose  CHIEF COMPLAINT:  sepsis   HISTORY OF PRESENT ILLNESS:   86 y.o. female who presented to the [facility] ER via EMS after her caregiver found her with AMS this morning.   CT abd/pel today revealed an obstructing 8 mm right UPJ stone associated with right sided hydronephrosis.    The patient is currently in septic shock with a lactate of 2.6, hypotension, WBC- 15 and serum creatinine of 1.4.   She is disoriented and unable answer historical questions and no family is at bedside.  3L fluids given in ER    CT abd pelvis 1. 8 mm calculus at the right UPJ with resultant mild to moderate right hydronephrosis. 2. Additional nonobstructive right nephrolithiasis.   IR CALLED TO EVALUATE PATIENT  Procedure: Right nephrostomy tube placement  Complications: None  Estimated Blood Loss: < 10 mL  Findings: Right renal collecting system with grossly purulent urine. Sample sent for culture.  POST procedure -alert  And awake VS stable  Not on pressors Not on oxygen   PAST MEDICAL HISTORY :   has a past medical history of Cancer ([place]), CHF (congestive heart failure) ([place]), COPD (chronic obstructive pulmonary disease) ([place]), and Hypertension.  has a past surgical history that includes Abdominal hysterectomy; Joint replacement; and IR NEPHROSTOMY PLACEMENT RIGHT (03/29/2020). Prior to Admission medications   Medication Sig Start Date End Date Taking? Authorizing Provider  calcium citrate-vitamin D (CITRACAL+D) 315-200 MG-UNIT tablet Take by mouth.   Yes [provider]  Cholecalciferol 25 MCG (1000 UT) tablet Take by mouth.   Yes [provider]  cyanocobalamin 1000 MCG tablet Take by mouth.   Yes [provider]  donepezil (ARICEPT) 10 MG tablet Take 10 mg by mouth at bedtime.   Yes [provider]  latanoprost (XALATAN) 0.005 %  ophthalmic solution 1 drop at bedtime. 03/13/20  Yes [provider]  memantine (NAMENDA) 10 MG tablet Take 10 mg by mouth 2 (two) times daily.   Yes [provider]  montelukast (SINGULAIR) 10 MG tablet Take 10 mg by mouth every morning.    Yes [provider]  omeprazole (PRILOSEC) 20 MG capsule Take 20 mg by mouth daily.   Yes [provider]  acetaminophen (TYLENOL) 650 MG CR tablet Take 650 mg by mouth 3 (three) times daily.    [provider]  amLODipine (NORVASC) 5 MG tablet Take 5 mg by mouth daily. Patient not taking: Reported on 03/29/2020    [provider]  aspirin 81 MG EC tablet Take by mouth.    [provider]  Calcium Carbonate-Vitamin D 600-400 MG-UNIT tablet Take 1 tablet by mouth every morning.    [provider]  diclofenac sodium (VOLTAREN) 1 % GEL Apply 4 g topically 4 (four) times daily. 04/29/18   Triplett, Johnette Abraham B, FNP  loperamide (IMODIUM A-D) 2 MG tablet Take by mouth.    [provider]  losartan (COZAAR) 100 MG tablet Take 100 mg by mouth daily.    [provider]  Multiple Vitamin (ONE DAILY) tablet Take by mouth.    [provider]  Multiple Vitamins-Minerals (CENTRUM SILVER PO) Take 1 tablet by mouth every morning.    [provider]   Allergies  Allergen Reactions  . Morphine And Related Other (See Comments)    Hallucinations    FAMILY HISTORY:  family history includes Hypertension  in an other family member. SOCIAL HISTORY:  reports that she has never smoked. She has never used smokeless tobacco. She reports that she does not drink alcohol and does not use drugs.   Review of Systems:  Gen:  Denies  fever, sweats, chills weight loss  HEENT: Denies blurred vision, double vision, ear pain, eye pain, hearing loss, nose bleeds, sore throat Cardiac:  No dizziness, chest pain or heaviness, chest tightness,edema, No JVD Resp:   No cough, -sputum production,  -shortness of breath,-wheezing, -hemoptysis,  Gi: Denies swallowing difficulty, stomach pain, nausea or vomiting, diarrhea, constipation, bowel incontinence Gu:  Denies bladder incontinence, burning urine Ext:   Denies Joint pain, stiffness or swelling Skin: Denies  skin rash, easy bruising or bleeding or hives Endoc:  Denies polyuria, polydipsia , polyphagia or weight change Psych:   Denies depression, insomnia or hallucinations  Other:  All other systems negative      Estimated body mass index is 27.06 kg/m as calculated from the following:   Height as of this encounter: 5\' 2"  (1.575 m).   Weight as of this encounter: 67.1 kg.    VITAL SIGNS: Temp:  [98.6 F (37 C)-101.7 F (38.7 C)] 98.9 F (37.2 C) (07/16 1636) Pulse Rate:  [70-104] 70 (07/16 1745) Resp:  [16-37] 21 (07/16 1745) BP: (99-129)/(43-56) 103/50 (07/16 1745) SpO2:  [94 %-99 %] 98 % (07/16 1745) Weight:  [67.1 kg] 67.1 kg (07/16 1636)   No intake/output data recorded. Total I/O In: 500 [IV Piggyback:500] Out: 300 [Urine:300]   SpO2: 98 % O2 Flow Rate (L/min): 3 L/min   Physical Examination:  GENERAL:critically ill appearing, HEAD: Normocephalic, atraumatic.  EYES: Pupils equal, round, reactive to light.  No scleral icterus.  MOUTH: Moist mucosal membrane. NECK: Supple. No JVD.  PULMONARY: CTA b/l  CARDIOVASCULAR: S1 and S2. Regular rate and rhythm. No murmurs, rubs, or gallops.  GASTROINTESTINAL: Soft, nontender, -distended.  Positive bowel sounds.  MUSCULOSKELETAL: RT nephrostomy tube in place NEUROLOGIC: alert and awake SKIN:intact,warm,dry    MEDICATIONS: I have reviewed all medications and confirmed regimen as documented   CULTURE RESULTS   Recent Results (from the past 240 hour(s))  SARS Coronavirus 2 by RT PCR (hospital order, performed in [facility] hospital lab) Nasopharyngeal Nasopharyngeal Swab     Status: None   Collection Time: 03/29/20 12:32 PM   Specimen: Nasopharyngeal Swab   Result Value Ref Range Status   SARS Coronavirus 2 NEGATIVE NEGATIVE Final    Comment: (NOTE) SARS-CoV-2 target nucleic acids are NOT DETECTED.  The SARS-CoV-2 RNA is generally detectable in upper and lower respiratory specimens during the acute phase of infection. The lowest concentration of SARS-CoV-2 viral copies this assay can detect is 250 copies / mL. A negative result does not preclude SARS-CoV-2 infection and should not be used as the sole basis for treatment or other patient management decisions.  A negative result may occur with improper specimen collection / handling, submission of specimen other than nasopharyngeal swab, presence of viral mutation(s) within the areas targeted by this assay, and inadequate number of viral copies (<250 copies / mL). A negative result must be combined with clinical observations, patient history, and epidemiological information.  Fact Sheet for Patients:   StrictlyIdeas.no  Fact Sheet for Healthcare Providers: BankingDealers.co.za  This test is not yet approved or  cleared by the [place] FDA and has been authorized for detection and/or diagnosis of SARS-CoV-2 by FDA under an Emergency Use Authorization (EUA).  This EUA will remain  in effect (meaning this test can be used) for the duration of the COVID-19 declaration under Section 564(b)(1) of the Act, 21 U.S.C. section 360bbb-3(b)(1), unless the authorization is terminated or revoked sooner.  Performed at [facility], [address] [place], [place] [address]           IMAGING    CT ABDOMEN PELVIS WO CONTRAST  Result Date: 03/29/2020 CLINICAL DATA:  Sepsis. EXAM: CT ABDOMEN AND PELVIS WITHOUT CONTRAST TECHNIQUE: Multidetector CT imaging of the abdomen and pelvis was performed following the standard protocol without IV contrast. COMPARISON:  None. FINDINGS: Lower chest: No acute abnormality. Bibasilar  atelectasis/scarring. Emphysema in the medial right middle lobe. Hepatobiliary: Nodular liver contour. No focal liver abnormality is seen. Layering high-density sludge or small stones in the gallbladder neck. No gallbladder wall thickening or biliary dilatation. Pancreas: Unremarkable. No pancreatic ductal dilatation or surrounding inflammatory changes. Spleen: Moderate splenomegaly. Adrenals/Urinary Tract: 1.5 cm left adrenal adenoma. Right adrenal gland is unremarkable. 8 mm calculus at the right UPJ with resultant mild to moderate right hydronephrosis. Additional nonobstructive right renal calculi. 3.4 cm simple cyst in the upper pole of the right kidney. Subcentimeter low-density lesion in the midpole of the left kidney is too small to characterize. No left hydronephrosis. Multiple bladder diverticula. Stomach/Bowel: Small hiatal hernia. The stomach is otherwise within normal limits. No bowel wall thickening, distention, or surrounding inflammatory changes. There are a few colonic diverticula. Diminutive or absent appendix. Vascular/Lymphatic: Aortic atherosclerosis. No enlarged abdominal or pelvic lymph nodes. Reproductive: Status post hysterectomy. No adnexal masses. Other: Small fat containing umbilical hernia. No free fluid or pneumoperitoneum. Musculoskeletal: No acute or significant osseous findings. Prior right total hip arthroplasty. IMPRESSION: 1. 8 mm calculus at the right UPJ with resultant mild to moderate right hydronephrosis. 2. Additional nonobstructive right nephrolithiasis. 3. Cirrhosis with sequelae of portal hypertension including moderate splenomegaly. 4. Cholelithiasis versus sludge. 5. 1.5 cm left adrenal adenoma. 6. Aortic Atherosclerosis (ICD10-I70.0) and Emphysema (ICD10-J43.9). Electronically Signed   By: Titus Dubin M.D.   On: 03/29/2020 14:43   DG Chest Port 1 View  Result Date: 03/29/2020 CLINICAL DATA:  86 year old female with history of sepsis. EXAM: PORTABLE CHEST 1 VIEW  COMPARISON:  Chest x-ray 07/06/2015. FINDINGS: Lung volumes are low. No consolidative airspace disease. No pleural effusions. No pneumothorax. No pulmonary nodule or mass noted. Pulmonary vasculature and the cardiomediastinal silhouette are within normal limits. Atherosclerotic calcifications in the thoracic aorta. Advanced degenerative changes of osteoarthritis are noted in shoulder joints bilaterally. IMPRESSION: 1. Low lung volumes without radiographic evidence of acute cardiopulmonary disease. 2. Aortic atherosclerosis. Electronically Signed   By: Vinnie Langton M.D.   On: 03/29/2020 11:36   IR NEPHROSTOMY PLACEMENT RIGHT  Result Date: 03/29/2020 CLINICAL DATA:  Sepsis and right renal obstruction with hydronephrosis secondary to an obstructing UPJ calculus. The patient requires a right percutaneous nephrostomy tube for emergent decompression of the right kidney. EXAM: 1. ULTRASOUND GUIDANCE FOR PUNCTURE OF THE RIGHT RENAL COLLECTING SYSTEM. 2. LEFT PERCUTANEOUS NEPHROSTOMY TUBE PLACEMENT. COMPARISON:  None. ANESTHESIA/SEDATION: 0.5 mg IV Versed Total Moderate Sedation Time 10 minutes. The patient's level of consciousness and physiologic status were continuously monitored during the procedure by Radiology nursing. CONTRAST:  10 mL Visipaque 320 ml Omnipaque 300 MEDICATIONS: None FLUOROSCOPY TIME:  24 seconds.  3.3 mGy. PROCEDURE: The procedure, risks, benefits, and alternatives were explained to the patient. Questions regarding the procedure were encouraged and answered. The patient understands and consents to the procedure. A time-out was  performed prior to initiating the procedure. The right flank region was prepped with chlorhexidine in a sterile fashion, and a sterile drape was applied covering the operative field. A sterile gown and sterile gloves were used for the procedure. Local anesthesia was provided with 1% Lidocaine. Ultrasound was used to localize the right kidney. Under direct ultrasound  guidance, an 18 gauge trocar needle was advanced into the right renal collecting system. Ultrasound image documentation was performed. Aspiration of urine sample was performed followed by contrast injection. Percutaneous tract dilatation was then performed over the guidewire. A 10-French percutaneous nephrostomy tube was then advanced and formed in the collecting system. Catheter position was confirmed by fluoroscopy after contrast injection. The catheter was secured at the skin with a Prolene retention suture and Stat-Lock device. A gravity bag was placed. COMPLICATIONS: None. FINDINGS: There was return of grossly purulent urine from the right renal collecting system. A sample was sent for culture analysis. After placement of the 10 French nephrostomy tube the catheter was formed at the level of the renal pelvis. There is good return of purulent and blood tinged urine. IMPRESSION: Right percutaneous nephrostomy tube placement. There was return of grossly purulent urine. A sample was sent for culture analysis. A 10 French nephrostomy tube was placed and attached to gravity bag drainage. Electronically Signed   By: Aletta Edouard M.D.   On: 03/29/2020 17:43        ASSESSMENT AND PLAN SYNOPSIS   Severe ACUTE sepsis with infected RT kidney stone with acute hydronephrosis Continue IV abx Follow up cultures Sepsis protocol Pressors if needed   Morbid obesity, possible OSA.     ACUTE KIDNEY INJURY/Renal Failure -continue Foley Catheter-assess need -Avoid nephrotoxic agents -Follow urine output, BMP -Ensure adequate renal perfusion, optimize oxygenation -Renal dose medications  SHOCK-SEPSIS/HYPOVOLUMIC -use vasopressors to keep MAP>65 if needed -follow ABG and LA -follow up cultures -emperic ABX  CARDIAC ICU monitoring  ID -continue IV abx as prescibed -follow up cultures  GI GI PROPHYLAXIS as indicated  NUTRITIONAL STATUS DIET--as tolerated Constipation protocol as  indicated   ENDO - will use ICU hypoglycemic\Hyperglycemia protocol if needed    ELECTROLYTES -follow labs as needed -replace as needed -pharmacy consultation and following    DVT/GI PRX ordered and assessed TRANSFUSIONS AS NEEDED MONITOR FSBS I Assessed the need for Labs I Assessed the need for Foley I Assessed the need for Central Venous Line Family Discussion when available I Assessed the need for Mobilization I made an Assessment of medications to be adjusted accordingly Safety Risk assessment Completed   prognosis is guarded.     Corrin Parker, M.D.  [facility]  Medical Director [place] Director [facility] Cardio-Pulmonary Department

## 2020-03-29 NOTE — Consult Note (Signed)
Urology Consult   Physician requesting consult: Duffy Bruce, MD  Reason for consult: Obstructing right UPJ stone  History of Present Illness: Brittney Wood is a 86 y.o. female who presented to the [place] ER via EMS after her caregiver found her with AMS this morning.  CT abd/pel today revealed an obstructing 8 mm right UPJ stone associated with right sided hydronephrosis.  The patient is currently in septic shock with a lactate of 2.6, hypotension, WBC- 15 and serum creatinine of 1.4.  She is disoriented and unable answer historical questions and no family is at bedside.    Past Medical History:  Diagnosis Date  . Cancer ([place])    skin  . CHF (congestive heart failure) ([place])   . COPD (chronic obstructive pulmonary disease) ([place])   . Hypertension     Past Surgical History:  Procedure Laterality Date  . ABDOMINAL HYSTERECTOMY    . IR NEPHROSTOMY PLACEMENT RIGHT  03/29/2020  . JOINT REPLACEMENT      Current Hospital Medications:  Home Meds:  Current Meds  Medication Sig  . calcium citrate-vitamin D (CITRACAL+D) 315-200 MG-UNIT tablet Take by mouth.  . Cholecalciferol 25 MCG (1000 UT) tablet Take by mouth.  . cyanocobalamin 1000 MCG tablet Take by mouth.  . donepezil (ARICEPT) 10 MG tablet Take 10 mg by mouth at bedtime.  Marland Kitchen latanoprost (XALATAN) 0.005 % ophthalmic solution 1 drop at bedtime.  . memantine (NAMENDA) 10 MG tablet Take 10 mg by mouth 2 (two) times daily.  . montelukast (SINGULAIR) 10 MG tablet Take 10 mg by mouth every morning.   Marland Kitchen omeprazole (PRILOSEC) 20 MG capsule Take 20 mg by mouth daily.    Scheduled Meds: . midazolam      . sodium chloride flush  5 mL Intracatheter Q8H   Continuous Infusions: . famotidine (PEPCID) IV     PRN Meds:.acetaminophen, docusate sodium, ondansetron (ZOFRAN) IV, polyethylene glycol  Allergies:  Allergies  Allergen Reactions  . Morphine And Related Other (See Comments)    Hallucinations    Family History  Problem  Relation Age of Onset  . Hypertension Other     Social History:  reports that she has never smoked. She has never used smokeless tobacco. She reports that she does not drink alcohol and does not use drugs.  ROS: A complete review of systems was unable to be performed.    Physical Exam:  Vital signs in last 24 hours: Temp:  [98.6 F (37 C)-101.7 F (38.7 C)] 98.9 F (37.2 C) (07/16 1636) Pulse Rate:  [70-104] 70 (07/16 1745) Resp:  [16-37] 21 (07/16 1745) BP: (99-129)/(43-56) 103/50 (07/16 1745) SpO2:  [94 %-99 %] 98 % (07/16 1745) Weight:  [67.1 kg] 67.1 kg (07/16 1636) Constitutional:  Disoriented x3 Cardiovascular: Regular rate and rhythm, No JVD   Laboratory Data:  Recent Labs    03/29/20 1104  WBC 15.3*  HGB 13.6  HCT 40.3  PLT 77*    Recent Labs    03/29/20 1104  NA 139  K 3.6  CL 110  GLUCOSE 120*  BUN 27*  CALCIUM 8.1*  CREATININE 1.40*     Results for orders placed or performed during the hospital encounter of 03/29/20 (from the past 24 hour(s))  Lactic acid, plasma     Status: Abnormal   Collection Time: 03/29/20 11:04 AM  Result Value Ref Range   Lactic Acid, Venous 2.6 (HH) 0.5 - 1.9 mmol/L  Comprehensive metabolic panel     Status: Abnormal  Collection Time: 03/29/20 11:04 AM  Result Value Ref Range   Sodium 139 135 - 145 mmol/L   Potassium 3.6 3.5 - 5.1 mmol/L   Chloride 110 98 - 111 mmol/L   CO2 21 (L) 22 - 32 mmol/L   Glucose, Bld 120 (H) 70 - 99 mg/dL   BUN 27 (H) 8 - 23 mg/dL   Creatinine, Ser 1.40 (H) 0.44 - 1.00 mg/dL   Calcium 8.1 (L) 8.9 - 10.3 mg/dL   Total Protein 6.3 (L) 6.5 - 8.1 g/dL   Albumin 3.1 (L) 3.5 - 5.0 g/dL   AST 23 15 - 41 U/L   ALT 22 0 - 44 U/L   Alkaline Phosphatase 72 38 - 126 U/L   Total Bilirubin 1.0 0.3 - 1.2 mg/dL   GFR calc non Af Amer 33 (L) >60 mL/min   GFR calc Af Amer 38 (L) >60 mL/min   Anion gap 8 5 - 15  CBC WITH DIFFERENTIAL     Status: Abnormal   Collection Time: 03/29/20 11:04 AM  Result  Value Ref Range   WBC 15.3 (H) 4.0 - 10.5 K/uL   RBC 4.15 3.87 - 5.11 MIL/uL   Hemoglobin 13.6 12.0 - 15.0 g/dL   HCT 40.3 36 - 46 %   MCV 97.1 80.0 - 100.0 fL   MCH 32.8 26.0 - 34.0 pg   MCHC 33.7 30.0 - 36.0 g/dL   RDW 13.2 11.5 - 15.5 %   Platelets 77 (L) 150 - 400 K/uL   nRBC 0.0 0.0 - 0.2 %   Neutrophils Relative % 91 %   Neutro Abs 13.9 (H) 1.7 - 7.7 K/uL   Lymphocytes Relative 2 %   Lymphs Abs 0.3 (L) 0.7 - 4.0 K/uL   Monocytes Relative 6 %   Monocytes Absolute 0.9 0 - 1 K/uL   Eosinophils Relative 0 %   Eosinophils Absolute 0.0 0 - 0 K/uL   Basophils Relative 0 %   Basophils Absolute 0.1 0 - 0 K/uL   Immature Granulocytes 1 %   Abs Immature Granulocytes 0.08 (H) 0.00 - 0.07 K/uL  APTT     Status: None   Collection Time: 03/29/20 11:04 AM  Result Value Ref Range   aPTT 32 24 - 36 seconds  Protime-INR     Status: Abnormal   Collection Time: 03/29/20 11:04 AM  Result Value Ref Range   Prothrombin Time 15.9 (H) 11.4 - 15.2 seconds   INR 1.3 (H) 0.8 - 1.2  Urinalysis, Routine w reflex microscopic     Status: Abnormal   Collection Time: 03/29/20 11:04 AM  Result Value Ref Range   Color, Urine YELLOW (A) YELLOW   APPearance TURBID (A) CLEAR   Specific Gravity, Urine 1.017 1.005 - 1.030   pH 7.0 5.0 - 8.0   Glucose, UA NEGATIVE NEGATIVE mg/dL   Hgb urine dipstick MODERATE (A) NEGATIVE   Bilirubin Urine NEGATIVE NEGATIVE   Ketones, ur NEGATIVE NEGATIVE mg/dL   Protein, ur 100 (A) NEGATIVE mg/dL   Nitrite NEGATIVE NEGATIVE   Leukocytes,Ua MODERATE (A) NEGATIVE   RBC / HPF >50 (H) 0 - 5 RBC/hpf   WBC, UA >50 (H) 0 - 5 WBC/hpf   Bacteria, UA NONE SEEN NONE SEEN   Squamous Epithelial / LPF 0-5 0 - 5   WBC Clumps PRESENT   Lactic acid, plasma     Status: Abnormal   Collection Time: 03/29/20 12:32 PM  Result Value Ref Range   Lactic Acid,  Venous 2.9 (HH) 0.5 - 1.9 mmol/L  SARS Coronavirus 2 by RT PCR (hospital order, performed in [facility] hospital lab)  Nasopharyngeal Nasopharyngeal Swab     Status: None   Collection Time: 03/29/20 12:32 PM   Specimen: Nasopharyngeal Swab  Result Value Ref Range   SARS Coronavirus 2 NEGATIVE NEGATIVE   Recent Results (from the past 240 hour(s))  SARS Coronavirus 2 by RT PCR (hospital order, performed in [facility] hospital lab) Nasopharyngeal Nasopharyngeal Swab     Status: None   Collection Time: 03/29/20 12:32 PM   Specimen: Nasopharyngeal Swab  Result Value Ref Range Status   SARS Coronavirus 2 NEGATIVE NEGATIVE Final    Comment: (NOTE) SARS-CoV-2 target nucleic acids are NOT DETECTED.  The SARS-CoV-2 RNA is generally detectable in upper and lower respiratory specimens during the acute phase of infection. The lowest concentration of SARS-CoV-2 viral copies this assay can detect is 250 copies / mL. A negative result does not preclude SARS-CoV-2 infection and should not be used as the sole basis for treatment or other patient management decisions.  A negative result may occur with improper specimen collection / handling, submission of specimen other than nasopharyngeal swab, presence of viral mutation(s) within the areas targeted by this assay, and inadequate number of viral copies (<250 copies / mL). A negative result must be combined with clinical observations, patient history, and epidemiological information.  Fact Sheet for Patients:   StrictlyIdeas.no  Fact Sheet for Healthcare Providers: BankingDealers.co.za  This test is not yet approved or  cleared by the [place] FDA and has been authorized for detection and/or diagnosis of SARS-CoV-2 by FDA under an Emergency Use Authorization (EUA).  This EUA will remain in effect (meaning this test can be used) for the duration of the COVID-19 declaration under Section 564(b)(1) of the Act, 21 U.S.C. section 360bbb-3(b)(1), unless the authorization is terminated or revoked sooner.  Performed at  [facility], [address] [place], [place] [address]     Renal Function: Recent Labs    03/29/20 1104  CREATININE 1.40*   Estimated Creatinine Clearance: 23 mL/min (A) (by C-G formula based on SCr of 1.4 mg/dL (H)).  Radiologic Imaging: CT ABDOMEN PELVIS WO CONTRAST  Result Date: 03/29/2020 CLINICAL DATA:  Sepsis. EXAM: CT ABDOMEN AND PELVIS WITHOUT CONTRAST TECHNIQUE: Multidetector CT imaging of the abdomen and pelvis was performed following the standard protocol without IV contrast. COMPARISON:  None. FINDINGS: Lower chest: No acute abnormality. Bibasilar atelectasis/scarring. Emphysema in the medial right middle lobe. Hepatobiliary: Nodular liver contour. No focal liver abnormality is seen. Layering high-density sludge or small stones in the gallbladder neck. No gallbladder wall thickening or biliary dilatation. Pancreas: Unremarkable. No pancreatic ductal dilatation or surrounding inflammatory changes. Spleen: Moderate splenomegaly. Adrenals/Urinary Tract: 1.5 cm left adrenal adenoma. Right adrenal gland is unremarkable. 8 mm calculus at the right UPJ with resultant mild to moderate right hydronephrosis. Additional nonobstructive right renal calculi. 3.4 cm simple cyst in the upper pole of the right kidney. Subcentimeter low-density lesion in the midpole of the left kidney is too small to characterize. No left hydronephrosis. Multiple bladder diverticula. Stomach/Bowel: Small hiatal hernia. The stomach is otherwise within normal limits. No bowel wall thickening, distention, or surrounding inflammatory changes. There are a few colonic diverticula. Diminutive or absent appendix. Vascular/Lymphatic: Aortic atherosclerosis. No enlarged abdominal or pelvic lymph nodes. Reproductive: Status post hysterectomy. No adnexal masses. Other: Small fat containing umbilical hernia. No free fluid or pneumoperitoneum. Musculoskeletal: No acute or significant  osseous findings. Prior right total hip  arthroplasty. IMPRESSION: 1. 8 mm calculus at the right UPJ with resultant mild to moderate right hydronephrosis. 2. Additional nonobstructive right nephrolithiasis. 3. Cirrhosis with sequelae of portal hypertension including moderate splenomegaly. 4. Cholelithiasis versus sludge. 5. 1.5 cm left adrenal adenoma. 6. Aortic Atherosclerosis (ICD10-I70.0) and Emphysema (ICD10-J43.9). Electronically Signed   By: Titus Dubin M.D.   On: 03/29/2020 14:43   DG Chest Port 1 View  Result Date: 03/29/2020 CLINICAL DATA:  86 year old female with history of sepsis. EXAM: PORTABLE CHEST 1 VIEW COMPARISON:  Chest x-ray 07/06/2015. FINDINGS: Lung volumes are low. No consolidative airspace disease. No pleural effusions. No pneumothorax. No pulmonary nodule or mass noted. Pulmonary vasculature and the cardiomediastinal silhouette are within normal limits. Atherosclerotic calcifications in the thoracic aorta. Advanced degenerative changes of osteoarthritis are noted in shoulder joints bilaterally. IMPRESSION: 1. Low lung volumes without radiographic evidence of acute cardiopulmonary disease. 2. Aortic atherosclerosis. Electronically Signed   By: Vinnie Langton M.D.   On: 03/29/2020 11:36   IR NEPHROSTOMY PLACEMENT RIGHT  Result Date: 03/29/2020 CLINICAL DATA:  Sepsis and right renal obstruction with hydronephrosis secondary to an obstructing UPJ calculus. The patient requires a right percutaneous nephrostomy tube for emergent decompression of the right kidney. EXAM: 1. ULTRASOUND GUIDANCE FOR PUNCTURE OF THE RIGHT RENAL COLLECTING SYSTEM. 2. LEFT PERCUTANEOUS NEPHROSTOMY TUBE PLACEMENT. COMPARISON:  None. ANESTHESIA/SEDATION: 0.5 mg IV Versed Total Moderate Sedation Time 10 minutes. The patient's level of consciousness and physiologic status were continuously monitored during the procedure by Radiology nursing. CONTRAST:  10 mL Visipaque 320 ml Omnipaque 300 MEDICATIONS: None FLUOROSCOPY TIME:  24 seconds.  3.3 mGy.  PROCEDURE: The procedure, risks, benefits, and alternatives were explained to the patient. Questions regarding the procedure were encouraged and answered. The patient understands and consents to the procedure. A time-out was performed prior to initiating the procedure. The right flank region was prepped with chlorhexidine in a sterile fashion, and a sterile drape was applied covering the operative field. A sterile gown and sterile gloves were used for the procedure. Local anesthesia was provided with 1% Lidocaine. Ultrasound was used to localize the right kidney. Under direct ultrasound guidance, an 18 gauge trocar needle was advanced into the right renal collecting system. Ultrasound image documentation was performed. Aspiration of urine sample was performed followed by contrast injection. Percutaneous tract dilatation was then performed over the guidewire. A 10-French percutaneous nephrostomy tube was then advanced and formed in the collecting system. Catheter position was confirmed by fluoroscopy after contrast injection. The catheter was secured at the skin with a Prolene retention suture and Stat-Lock device. A gravity bag was placed. COMPLICATIONS: None. FINDINGS: There was return of grossly purulent urine from the right renal collecting system. A sample was sent for culture analysis. After placement of the 10 French nephrostomy tube the catheter was formed at the level of the renal pelvis. There is good return of purulent and blood tinged urine. IMPRESSION: Right percutaneous nephrostomy tube placement. There was return of grossly purulent urine. A sample was sent for culture analysis. A 10 French nephrostomy tube was placed and attached to gravity bag drainage. Electronically Signed   By: Aletta Edouard M.D.   On: 03/29/2020 17:43    I independently reviewed the above imaging studies.  Impression/Recommendation 86 year old female with urosepsis 2/2 to an obstructing right UPJ calculus  -IR has been  consulted to place and urgent right PCN -Urine and blood cultures are pending--continue broad spectrum abx -  Continue IVF fluid resuscitation -Will continue follow  Ellison Hughs, [facility] 03/29/2020, 5:54 PM

## 2020-03-29 NOTE — ED Provider Notes (Signed)
[facility] Emergency Department Provider Note  ____________________________________________   First MD Initiated Contact with Patient 03/29/20 1057     (approximate)  I have reviewed the triage vital signs and the nursing notes.   HISTORY  Chief Complaint Altered Mental Status    HPI Brittney Wood is a 86 y.o. female  Here with altered mental status. Pt arrives with report of AMS. Per EMS, they were called to pt's house due to pt being found confused, altered. Caregiver came in this AM and found pt confused, sitting on the same chair where she was last seen yesterday AM and in the same clothes. Caregiver notes polyuria over last few days. Pt tachycardic, mildly hypotensive en route.  On my assessment, pt able to tell me where she is and why she is here, though she states she was just resting and had not been feeling "that well." She does endorse polyuria, denies any abd pain. Denies any falls.          Past Medical History:  Diagnosis Date  . Cancer ([place])    skin  . CHF (congestive heart failure) ([place])   . COPD (chronic obstructive pulmonary disease) ([place])   . Hypertension     Patient Active Problem List   Diagnosis Date Noted  . Septic shock ([place]) 03/29/2020  . COPD exacerbation ([place]) 07/06/2015    Past Surgical History:  Procedure Laterality Date  . ABDOMINAL HYSTERECTOMY    . IR NEPHROSTOMY PLACEMENT RIGHT  03/29/2020  . JOINT REPLACEMENT      Prior to Admission medications   Medication Sig Start Date End Date Taking? Authorizing Provider  calcium citrate-vitamin D (CITRACAL+D) 315-200 MG-UNIT tablet Take by mouth.   Yes [provider]  Cholecalciferol 25 MCG (1000 UT) tablet Take by mouth.   Yes [provider]  cyanocobalamin 1000 MCG tablet Take by mouth.   Yes [provider]  donepezil (ARICEPT) 10 MG tablet Take 10 mg by mouth at bedtime.   Yes [provider]  latanoprost (XALATAN) 0.005 %  ophthalmic solution 1 drop at bedtime. 03/13/20  Yes [provider]  memantine (NAMENDA) 10 MG tablet Take 10 mg by mouth 2 (two) times daily.   Yes [provider]  montelukast (SINGULAIR) 10 MG tablet Take 10 mg by mouth every morning.    Yes [provider]  omeprazole (PRILOSEC) 20 MG capsule Take 20 mg by mouth daily.   Yes [provider]  acetaminophen (TYLENOL) 650 MG CR tablet Take 650 mg by mouth 3 (three) times daily.    [provider]  amLODipine (NORVASC) 5 MG tablet Take 5 mg by mouth daily. Patient not taking: Reported on 03/29/2020    [provider]  aspirin 81 MG EC tablet Take by mouth.    [provider]  Calcium Carbonate-Vitamin D 600-400 MG-UNIT tablet Take 1 tablet by mouth every morning.    [provider]  diclofenac sodium (VOLTAREN) 1 % GEL Apply 4 g topically 4 (four) times daily. 04/29/18   Triplett, Johnette Abraham B, FNP  loperamide (IMODIUM A-D) 2 MG tablet Take by mouth.    [provider]  losartan (COZAAR) 100 MG tablet Take 100 mg by mouth daily.    [provider]  Multiple Vitamin (ONE DAILY) tablet Take by mouth.    [provider]  Multiple Vitamins-Minerals (CENTRUM SILVER PO) Take 1 tablet by mouth every morning.    [provider]    Allergies  Morphine and related  Family History  Problem Relation Age of Onset  . Hypertension Other     Social History Social History   Tobacco Use  . Smoking status: Never Smoker  . Smokeless tobacco: Never Used  Substance Use Topics  . Alcohol use: No  . Drug use: No    Review of Systems  Review of Systems  Constitutional: Positive for fatigue. Negative for fever.  HENT: Negative for congestion and sore throat.   Eyes: Negative for visual disturbance.  Respiratory: Negative for cough and shortness of breath.   Cardiovascular: Negative for chest pain.  Gastrointestinal: Negative for abdominal pain,  diarrhea, nausea and vomiting.  Genitourinary: Positive for difficulty urinating and dysuria. Negative for flank pain.  Musculoskeletal: Negative for back pain and neck pain.  Skin: Negative for rash and wound.  Neurological: Negative for weakness.     ____________________________________________  PHYSICAL EXAM:      VITAL SIGNS: ED Triage Vitals  Enc Vitals Group     BP 03/29/20 1044 (!) 116/56     Pulse Rate 03/29/20 1044 (!) 104     Resp 03/29/20 1044 (!) 24     Temp 03/29/20 1044 99.9 F (37.7 C)     Temp Source 03/29/20 1044 Oral     SpO2 03/29/20 1044 94 %     Weight 03/29/20 1045 147 lb 14.9 oz (67.1 kg)     Height 03/29/20 1045 5\' 2"  (1.575 m)     Head Circumference --      Peak Flow --      Pain Score 03/29/20 1045 0     Pain Loc --      Pain Edu? --      Excl. in [place]? --      Physical Exam Vitals and nursing note reviewed.  Constitutional:      General: She is not in acute distress.    Appearance: She is well-developed.  HENT:     Head: Normocephalic and atraumatic.     Mouth/Throat:     Mouth: Mucous membranes are dry.  Eyes:     Conjunctiva/sclera: Conjunctivae normal.  Cardiovascular:     Rate and Rhythm: Regular rhythm. Tachycardia present.     Heart sounds: Normal heart sounds. No murmur heard.  No friction rub.  Pulmonary:     Effort: Pulmonary effort is normal. No respiratory distress.     Breath sounds: Normal breath sounds. No wheezing or rales.  Abdominal:     General: There is no distension.     Palpations: Abdomen is soft.     Tenderness: There is no abdominal tenderness.  Musculoskeletal:     Cervical back: Neck supple.  Skin:    General: Skin is warm.     Capillary Refill: Capillary refill takes less than 2 seconds.  Neurological:     Mental Status: She is alert. She is disoriented.     Motor: No abnormal muscle tone.       ____________________________________________   LABS (all labs ordered are listed, but only abnormal  results are displayed)  Labs Reviewed  LACTIC ACID, PLASMA - Abnormal; Notable for the following components:      Result Value   Lactic Acid, Venous 2.9 (*)    All other components within normal limits  LACTIC ACID, PLASMA - Abnormal; Notable for the following components:   Lactic Acid, Venous 2.6 (*)    All other components within normal limits  COMPREHENSIVE METABOLIC PANEL - Abnormal; Notable for the  following components:   CO2 21 (*)    Glucose, Bld 120 (*)    BUN 27 (*)    Creatinine, Ser 1.40 (*)    Calcium 8.1 (*)    Total Protein 6.3 (*)    Albumin 3.1 (*)    GFR calc non Af Amer 33 (*)    GFR calc Af Amer 38 (*)    All other components within normal limits  CBC WITH DIFFERENTIAL/PLATELET - Abnormal; Notable for the following components:   WBC 15.3 (*)    Platelets 77 (*)    Neutro Abs 13.9 (*)    Lymphs Abs 0.3 (*)    Abs Immature Granulocytes 0.08 (*)    All other components within normal limits  PROTIME-INR - Abnormal; Notable for the following components:   Prothrombin Time 15.9 (*)    INR 1.3 (*)    All other components within normal limits  URINALYSIS, ROUTINE W REFLEX MICROSCOPIC - Abnormal; Notable for the following components:   Color, Urine YELLOW (*)    APPearance TURBID (*)    Hgb urine dipstick MODERATE (*)    Protein, ur 100 (*)    Leukocytes,Ua MODERATE (*)    RBC / HPF >50 (*)    WBC, UA >50 (*)    All other components within normal limits  SARS CORONAVIRUS 2 BY RT PCR (HOSPITAL ORDER, [facility])  CULTURE, BLOOD (ROUTINE X 2)  CULTURE, BLOOD (ROUTINE X 2)  URINE CULTURE  SARS CORONAVIRUS 2 BY RT PCR (HOSPITAL ORDER, [facility])  AEROBIC/ANAEROBIC CULTURE (SURGICAL/DEEP WOUND)  APTT  CBC  BASIC METABOLIC PANEL  BLOOD GAS, ARTERIAL    ____________________________________________  EKG: Sinus rhythm, VR 98. QRS 132, QTc 464. No acute ST elevation or depression. LBBB.    ________________________________________  RADIOLOGY All imaging, including plain films, CT scans, and ultrasounds, independently reviewed by me, and interpretations confirmed via formal radiology reads.  ED MD interpretation:   CXR: Clear   Official radiology report(s): CT ABDOMEN PELVIS WO CONTRAST  Result Date: 03/29/2020 CLINICAL DATA:  Sepsis. EXAM: CT ABDOMEN AND PELVIS WITHOUT CONTRAST TECHNIQUE: Multidetector CT imaging of the abdomen and pelvis was performed following the standard protocol without IV contrast. COMPARISON:  None. FINDINGS: Lower chest: No acute abnormality. Bibasilar atelectasis/scarring. Emphysema in the medial right middle lobe. Hepatobiliary: Nodular liver contour. No focal liver abnormality is seen. Layering high-density sludge or small stones in the gallbladder neck. No gallbladder wall thickening or biliary dilatation. Pancreas: Unremarkable. No pancreatic ductal dilatation or surrounding inflammatory changes. Spleen: Moderate splenomegaly. Adrenals/Urinary Tract: 1.5 cm left adrenal adenoma. Right adrenal gland is unremarkable. 8 mm calculus at the right UPJ with resultant mild to moderate right hydronephrosis. Additional nonobstructive right renal calculi. 3.4 cm simple cyst in the upper pole of the right kidney. Subcentimeter low-density lesion in the midpole of the left kidney is too small to characterize. No left hydronephrosis. Multiple bladder diverticula. Stomach/Bowel: Small hiatal hernia. The stomach is otherwise within normal limits. No bowel wall thickening, distention, or surrounding inflammatory changes. There are a few colonic diverticula. Diminutive or absent appendix. Vascular/Lymphatic: Aortic atherosclerosis. No enlarged abdominal or pelvic lymph nodes. Reproductive: Status post hysterectomy. No adnexal masses. Other: Small fat containing umbilical hernia. No free fluid or pneumoperitoneum. Musculoskeletal: No acute or significant osseous findings. Prior  right total hip arthroplasty. IMPRESSION: 1. 8 mm calculus at the right UPJ with resultant mild to moderate right hydronephrosis. 2. Additional nonobstructive right  nephrolithiasis. 3. Cirrhosis with sequelae of portal hypertension including moderate splenomegaly. 4. Cholelithiasis versus sludge. 5. 1.5 cm left adrenal adenoma. 6. Aortic Atherosclerosis (ICD10-I70.0) and Emphysema (ICD10-J43.9). Electronically Signed   By: Titus Dubin M.D.   On: 03/29/2020 14:43   DG Chest Port 1 View  Result Date: 03/29/2020 CLINICAL DATA:  86 year old female with history of sepsis. EXAM: PORTABLE CHEST 1 VIEW COMPARISON:  Chest x-ray 07/06/2015. FINDINGS: Lung volumes are low. No consolidative airspace disease. No pleural effusions. No pneumothorax. No pulmonary nodule or mass noted. Pulmonary vasculature and the cardiomediastinal silhouette are within normal limits. Atherosclerotic calcifications in the thoracic aorta. Advanced degenerative changes of osteoarthritis are noted in shoulder joints bilaterally. IMPRESSION: 1. Low lung volumes without radiographic evidence of acute cardiopulmonary disease. 2. Aortic atherosclerosis. Electronically Signed   By: Vinnie Langton M.D.   On: 03/29/2020 11:36   IR NEPHROSTOMY PLACEMENT RIGHT  Result Date: 03/29/2020 CLINICAL DATA:  Sepsis and right renal obstruction with hydronephrosis secondary to an obstructing UPJ calculus. The patient requires a right percutaneous nephrostomy tube for emergent decompression of the right kidney. EXAM: 1. ULTRASOUND GUIDANCE FOR PUNCTURE OF THE RIGHT RENAL COLLECTING SYSTEM. 2. LEFT PERCUTANEOUS NEPHROSTOMY TUBE PLACEMENT. COMPARISON:  None. ANESTHESIA/SEDATION: 0.5 mg IV Versed Total Moderate Sedation Time 10 minutes. The patient's level of consciousness and physiologic status were continuously monitored during the procedure by Radiology nursing. CONTRAST:  10 mL Visipaque 320 ml Omnipaque 300 MEDICATIONS: None FLUOROSCOPY TIME:  24 seconds.   3.3 mGy. PROCEDURE: The procedure, risks, benefits, and alternatives were explained to the patient. Questions regarding the procedure were encouraged and answered. The patient understands and consents to the procedure. A time-out was performed prior to initiating the procedure. The right flank region was prepped with chlorhexidine in a sterile fashion, and a sterile drape was applied covering the operative field. A sterile gown and sterile gloves were used for the procedure. Local anesthesia was provided with 1% Lidocaine. Ultrasound was used to localize the right kidney. Under direct ultrasound guidance, an 18 gauge trocar needle was advanced into the right renal collecting system. Ultrasound image documentation was performed. Aspiration of urine sample was performed followed by contrast injection. Percutaneous tract dilatation was then performed over the guidewire. A 10-French percutaneous nephrostomy tube was then advanced and formed in the collecting system. Catheter position was confirmed by fluoroscopy after contrast injection. The catheter was secured at the skin with a Prolene retention suture and Stat-Lock device. A gravity bag was placed. COMPLICATIONS: None. FINDINGS: There was return of grossly purulent urine from the right renal collecting system. A sample was sent for culture analysis. After placement of the 10 French nephrostomy tube the catheter was formed at the level of the renal pelvis. There is good return of purulent and blood tinged urine. IMPRESSION: Right percutaneous nephrostomy tube placement. There was return of grossly purulent urine. A sample was sent for culture analysis. A 10 French nephrostomy tube was placed and attached to gravity bag drainage. Electronically Signed   By: Aletta Edouard M.D.   On: 03/29/2020 17:43    ____________________________________________  PROCEDURES   Procedure(s) performed (including Critical Care):  .Critical Care Performed by: Duffy Bruce,  MD Authorized by: Duffy Bruce, MD   Critical care provider statement:    Critical care time (minutes):  55   Critical care time was exclusive of:  Separately billable procedures and treating other patients and teaching time   Critical care was necessary to treat or prevent imminent or life-threatening  deterioration of the following conditions:  Cardiac failure, circulatory failure, respiratory failure and sepsis   Critical care was time spent personally by me on the following activities:  Development of treatment plan with patient or surrogate, discussions with consultants, evaluation of patient's response to treatment, examination of patient, obtaining history from patient or surrogate, ordering and performing treatments and interventions, ordering and review of laboratory studies, ordering and review of radiographic studies, pulse oximetry, re-evaluation of patient's condition and review of old charts   I assumed direction of critical care for this patient from another provider in my specialty: no   .1-3 Lead EKG Interpretation Performed by: Duffy Bruce, MD Authorized by: Duffy Bruce, MD     Interpretation: normal     ECG rate:  70-90s   ECG rate assessment: normal     Rhythm: sinus rhythm     Ectopy: none     Conduction: normal   Comments:     Indication: Sepsis    ____________________________________________  INITIAL IMPRESSION / MDM / [place] / ED COURSE  As part of my medical decision making, I reviewed the following data within the [facility] notes reviewed and incorporated, Old chart reviewed, Notes from prior ED visits, and [place] Controlled Substance Database       *Brittney Wood was evaluated in Emergency Department on 03/29/2020 for the symptoms described in the history of present illness. She was evaluated in the context of the global COVID-19 pandemic, which necessitated consideration that the patient might be at risk for  infection with the SARS-CoV-2 virus that causes COVID-19. Institutional protocols and algorithms that pertain to the evaluation of patients at risk for COVID-19 are in a state of rapid change based on information released by regulatory bodies including the CDC and federal and state organizations. These policies and algorithms were followed during the patient's care in the ED.  Some ED evaluations and interventions may be delayed as a result of limited staffing during the pandemic.*     Medical Decision Making:  86 yo F here with confusion, weakness. Initial lab work shows leukocytosis, AKI, lactic acidosis consistent with sepsis likely 2/2 UTI. CXR clear. LA increasing despite IVF, ABX so CT obtained which shows obstructing stone. Discussed with Dr. Lovena Neighbours of Urology, who recommends IR consult for perc neph placement. Discussed with Dr. Madelyn Brunner who will perform neph tube as soon as able. Will admit to ICU. INR added, COVID sent.  ____________________________________________  FINAL CLINICAL IMPRESSION(S) / ED DIAGNOSES  Final diagnoses:  Sepsis secondary to UTI ([place])  Ureterolithiasis  Hydronephrosis with ureteropelvic junction (UPJ) obstruction     MEDICATIONS GIVEN DURING THIS VISIT:  Medications  acetaminophen (TYLENOL) tablet 650 mg (has no administration in time range)  docusate sodium (COLACE) capsule 100 mg (has no administration in time range)  polyethylene glycol (MIRALAX / GLYCOLAX) packet 17 g (has no administration in time range)  ondansetron (ZOFRAN) injection 4 mg (has no administration in time range)  famotidine (PEPCID) IVPB 20 mg premix (has no administration in time range)  midazolam (VERSED) 5 MG/5ML injection (has no administration in time range)  sodium chloride flush (NS) 0.9 % injection 5 mL (has no administration in time range)  cefTRIAXone (ROCEPHIN) 2 g in sodium chloride 0.9 % 100 mL IVPB (has no administration in time range)  enoxaparin (LOVENOX) injection 30  mg (has no administration in time range)  sodium chloride 0.9 % bolus 1,000 mL (0 mLs Intravenous Stopped 03/29/20 1201)  And  sodium chloride 0.9 % bolus 1,000 mL (0 mLs Intravenous Stopped 03/29/20 1232)    And  sodium chloride 0.9 % bolus 250 mL (0 mLs Intravenous Stopped 03/29/20 1150)  ceFEPIme (MAXIPIME) 2 g in sodium chloride 0.9 % 100 mL IVPB (0 g Intravenous Stopped 03/29/20 1201)  metroNIDAZOLE (FLAGYL) IVPB 500 mg (0 mg Intravenous Stopped 03/29/20 1221)  vancomycin (VANCOCIN) IVPB 1000 mg/200 mL premix (0 mg Intravenous Stopped 03/29/20 1248)  acetaminophen (TYLENOL) tablet 650 mg (650 mg Oral Given 03/29/20 1232)  sodium chloride 0.9 % bolus 500 mL (0 mLs Intravenous Stopped 03/29/20 1514)  midazolam (VERSED) 5 MG/5ML injection (1 mg Intravenous Given 03/29/20 1716)  iodixanol (VISIPAQUE) 320 MG/ML injection 50 mL (10 mLs Other Contrast Given 03/29/20 1728)     ED Discharge Orders    None       Note:  This document was prepared using Dragon voice recognition software and may include unintentional dictation errors.   Duffy Bruce, MD 03/29/20 Tresa Moore

## 2020-03-29 NOTE — Progress Notes (Signed)
CODE SEPSIS - PHARMACY COMMUNICATION  **Broad Spectrum Antibiotics should be administered within 1 hour of Sepsis diagnosis**  Time Code Sepsis Called/Page Received: 1104  Antibiotics Ordered: cefepime, vancomycin, metronidazole  Time of 1st antibiotic administration: 1124  Additional action taken by pharmacy: n/a  Benita Gutter 03/29/2020  11:15 AM

## 2020-03-29 NOTE — ED Notes (Signed)
Date and time results received: 03/29/20 1:24 PM   Test: lactic Critical Value: 2.9  Name of Provider Notified: Dr. Duffy Bruce

## 2020-03-29 NOTE — Progress Notes (Signed)
Anticoagulation monitoring(Lovenox):  86 yo female ordered Lovenox 40 mg Q24h  Filed Weights   03/29/20 1045 03/29/20 1636  Weight: 67.1 kg (147 lb 14.9 oz) 67.1 kg (147 lb 14.9 oz)   BMI 27   Lab Results  Component Value Date   CREATININE 1.40 (H) 03/29/2020   CREATININE 0.68 07/06/2015   CREATININE 0.73 01/03/2015   Estimated Creatinine Clearance: 23 mL/min (A) (by C-G formula based on SCr of 1.4 mg/dL (H)). Hemoglobin & Hematocrit     Component Value Date/Time   HGB 13.6 03/29/2020 1104   HGB 10.9 (L) 01/03/2015 1109   HCT 40.3 03/29/2020 1104   HCT 34.9 (L) 01/03/2015 1109     Per Protocol for Patient with estCrcl < 30 ml/min and BMI < 40, will transition to Lovenox 30 mg Q24h.

## 2020-03-29 NOTE — ED Triage Notes (Addendum)
BIB [facility] from her home due to AMS noticed by caregiver. Pt last seen at 1130AM yesterday, caregiver stated that pt was still in chair upon arrival today which is where she was yesterday and in the same clothes. Pt denies any falls, states she always sleeps in recliner. Caregivers report polyuria over last few days. EMS reports tachycardia, possible fever. Bilateral PIV to hands started, 300cc NS infused. cbg 124.  18G to L Hand by EMS would not flush, return blood and is bleeding around site. Removed by this RN.

## 2020-03-29 NOTE — ED Notes (Addendum)
RN attempted to call Daughter Enid Derry, and with both phone numbers, no one answered.   Report called to [facility], in [place] for neprostomy placement.   RN witnessed consent for procedure.

## 2020-03-29 NOTE — Consult Note (Signed)
PHARMACY -  BRIEF ANTIBIOTIC NOTE   Pharmacy has received consult(s) for cefepime and vancomycin from an ED provider. Patient is also ordered metronidazole. The patient's profile has been reviewed for ht/wt/allergies/indication/available labs.    Patient is a 86 y/o F with medical history including dementia, diastolic CHF, valvular heart disease, COPD, hypertension who presents to the ED with altered mental status. Labs pending. Imaging pending.   One time order(s) placed for -Vancomycin 1 g -Cefepime 2 g  Further antibiotics/pharmacy consults should be ordered by admitting physician if indicated.                       Thank you, Benita Gutter 03/29/2020  11:09 AM

## 2020-03-29 NOTE — Progress Notes (Signed)
Notified bedside nurse of need to draw repeat lactic acid. 

## 2020-03-29 NOTE — ED Notes (Signed)
Lactic acid 2.6 verbal readback in person Dr. Duffy Bruce.

## 2020-03-29 NOTE — ED Notes (Signed)
Purewick placed on patient.

## 2020-03-30 ENCOUNTER — Inpatient Hospital Stay: Payer: Medicare Other

## 2020-03-30 DIAGNOSIS — N136 Pyonephrosis: Secondary | ICD-10-CM

## 2020-03-30 DIAGNOSIS — N132 Hydronephrosis with renal and ureteral calculous obstruction: Secondary | ICD-10-CM

## 2020-03-30 DIAGNOSIS — N130 Hydronephrosis with ureteropelvic junction obstruction: Secondary | ICD-10-CM

## 2020-03-30 LAB — GLUCOSE, CAPILLARY: Glucose-Capillary: 117 mg/dL — ABNORMAL HIGH (ref 70–99)

## 2020-03-30 LAB — BLOOD GAS, ARTERIAL
Acid-Base Excess: 0.3 mmol/L (ref 0.0–2.0)
Bicarbonate: 23.1 mmol/L (ref 20.0–28.0)
FIO2: 0.24
O2 Saturation: 97.1 %
pCO2 arterial: 31 mmHg — ABNORMAL LOW (ref 32.0–48.0)
pH, Arterial: 7.48 — ABNORMAL HIGH (ref 7.350–7.450)
pO2, Arterial: 65 mmHg — ABNORMAL LOW (ref 83.0–108.0)

## 2020-03-30 LAB — BASIC METABOLIC PANEL
Anion gap: 10 (ref 5–15)
BUN: 25 mg/dL — ABNORMAL HIGH (ref 8–23)
CO2: 23 mmol/L (ref 22–32)
Calcium: 8.2 mg/dL — ABNORMAL LOW (ref 8.9–10.3)
Chloride: 108 mmol/L (ref 98–111)
Creatinine, Ser: 1.13 mg/dL — ABNORMAL HIGH (ref 0.44–1.00)
GFR calc Af Amer: 49 mL/min — ABNORMAL LOW (ref >60)
GFR calc non Af Amer: 42 mL/min — ABNORMAL LOW (ref >60)
Glucose, Bld: 126 mg/dL — ABNORMAL HIGH (ref 70–99)
Potassium: 3.3 mmol/L — ABNORMAL LOW (ref 3.5–5.1)
Sodium: 141 mmol/L (ref 135–145)

## 2020-03-30 LAB — CBC
HCT: 36.5 % (ref 36.0–46.0)
Hemoglobin: 12.8 g/dL (ref 12.0–15.0)
MCH: 33 pg (ref 26.0–34.0)
MCHC: 35.1 g/dL (ref 30.0–36.0)
MCV: 94.1 fL (ref 80.0–100.0)
Platelets: 69 10*3/uL — ABNORMAL LOW (ref 150–400)
RBC: 3.88 MIL/uL (ref 3.87–5.11)
RDW: 13.7 % (ref 11.5–15.5)
WBC: 7.3 10*3/uL (ref 4.0–10.5)
nRBC: 0 % (ref 0.0–0.2)

## 2020-03-30 LAB — MAGNESIUM: Magnesium: 1.8 mg/dL (ref 1.7–2.4)

## 2020-03-30 LAB — PHOSPHORUS: Phosphorus: 3.3 mg/dL (ref 2.5–4.6)

## 2020-03-30 MED ORDER — CHLORHEXIDINE GLUCONATE CLOTH 2 % EX PADS
6.0000 | MEDICATED_PAD | Freq: Every day | CUTANEOUS | Status: DC
  Administered 2020-03-30 – 2020-04-01 (×3): 6 via TOPICAL

## 2020-03-30 MED ORDER — POTASSIUM CHLORIDE CRYS ER 20 MEQ PO TBCR
40.0000 meq | EXTENDED_RELEASE_TABLET | Freq: Once | ORAL | Status: AC
  Administered 2020-03-30: 40 meq via ORAL
  Filled 2020-03-30: qty 2

## 2020-03-30 MED ORDER — LOPERAMIDE HCL 2 MG PO CAPS
2.0000 mg | ORAL_CAPSULE | ORAL | Status: DC | PRN
  Administered 2020-03-31 (×3): 2 mg via ORAL
  Filled 2020-03-30 (×3): qty 1

## 2020-03-30 NOTE — Progress Notes (Signed)
PHARMACY CONSULT NOTE - FOLLOW UP  Pharmacy Consult for Electrolyte Monitoring and Replacement   Recent Labs: Potassium (mmol/L)  Date Value  03/30/2020 3.3 (L)  01/03/2015 3.9   Magnesium (mg/dL)  Date Value  03/30/2020 1.8   Calcium (mg/dL)  Date Value  03/30/2020 8.2 (L)   Calcium, Total (mg/dL)  Date Value  01/03/2015 8.2 (L)   Albumin (g/dL)  Date Value  03/29/2020 3.1 (L)  01/18/2014 2.9 (L)   Phosphorus (mg/dL)  Date Value  03/30/2020 3.3   Sodium (mmol/L)  Date Value  03/30/2020 141  01/03/2015 134 (L)     Assessment: 86 year old female with MRSA bacteremia. Patient with 8 mm right UPJ stone with upstream hydronephrosis and additional 9 mm right lower pole stone burden.   Goal of Therapy:  Electrolytes WNL  Plan:  Potassium 40 mEq PO x 1. Follow up electrolytes with morning labs.  Tawnya Crook ,PharmD Clinical Pharmacist 03/30/2020 12:39 PM

## 2020-03-30 NOTE — Progress Notes (Signed)
Patient noted with x3 loose stools, incontinent. Provider noted, New orders received for imodium. Orders noted.

## 2020-03-30 NOTE — Consult Note (Signed)
NAME: Brittney Wood  DOB: Jan 04, 1925  MRN: [id number]  Date/Time: 03/30/2020 11:49 AM  REQUESTING PROVIDER: Mortimer Fries Subjective:  REASON FOR CONSULT: MRSA bacteremia ?chart reviewed , spoke to daughter at bed side. Pt a poor historian Brittney Wood is a 86 y.o. female with a history of COPD, CHF, HTn, B/L TKA , dementia was brought from home with altered mental status. Pt lives on her own, gets 2 hrs home aide. As per daughter she was well a day ago. In the ED temp 101.7, 116/56, HR 104 Blood culture sent and started on vanco and ceftriaxone CT abdomen showed 8 mm calculus at the right UPJ with resultant mild to moderate right hydronephrosis. 2. Additional nonobstructive right nephrolithiasis. 3. Cirrhosis with sequelae of portal hypertension including moderate splenomegaly. 4. Cholelithiasis versus sludge. 5. 1.5 cm left adrenal adenoma. IR did a rt nephrostomy ad urine culture sent Blood culture is positive for MRSA and I am seeing the patient for the same  Past Medical History:  Diagnosis Date  . Cancer ([place])    skin  . CHF (congestive heart failure) ([place])   . COPD (chronic obstructive pulmonary disease) ([place])   . Hypertension     Past Surgical History:  Procedure Laterality Date  . ABDOMINAL HYSTERECTOMY    . IR NEPHROSTOMY PLACEMENT RIGHT  03/29/2020  . JOINT REPLACEMENT-b/l TKA      Social History   Socioeconomic History  . Marital status: Single    Spouse name: Not on file  . Number of children: Not on file  . Years of education: Not on file  . Highest education level: Not on file  Occupational History  . Not on file  Tobacco Use  . Smoking status: Never Smoker  . Smokeless tobacco: Never Used  Substance and Sexual Activity  . Alcohol use: No  . Drug use: No  . Sexual activity: Not on file  Other Topics Concern  . Not on file  Social History Narrative  . Not on file   Social Determinants of Health   Financial Resource Strain:   . Difficulty of Paying Living  Expenses:   Food Insecurity:   . Worried About Charity fundraiser in the Last Year:   . Arboriculturist in the Last Year:   Transportation Needs:   . Film/video editor (Medical):   Marland Kitchen Lack of Transportation (Non-Medical):   Physical Activity:   . Days of Exercise per Week:   . Minutes of Exercise per Session:   Stress:   . Feeling of Stress :   Social Connections:   . Frequency of Communication with Friends and Family:   . Frequency of Social Gatherings with Friends and Family:   . Attends Religious Services:   . Active Member of Clubs or Organizations:   . Attends Archivist Meetings:   Marland Kitchen Marital Status:   Intimate Partner Violence:   . Fear of Current or Ex-Partner:   . Emotionally Abused:   Marland Kitchen Physically Abused:   . Sexually Abused:     Family History  Problem Relation Age of Onset  . Hypertension Other    Allergies  Allergen Reactions  . Morphine And Related Other (See Comments)    Hallucinations    ? Current Facility-Administered Medications  Medication Dose Route Frequency Provider Last Rate Last Admin  . acetaminophen (TYLENOL) tablet 650 mg  650 mg Oral Q4H PRN Flora Lipps, MD      . cefTRIAXone (ROCEPHIN) 2 g in sodium chloride  0.9 % 100 mL IVPB  2 g Intravenous Q24H Flora Lipps, MD 200 mL/hr at 03/29/20 1939 2 g at 03/29/20 1939  . Chlorhexidine Gluconate Cloth 2 % PADS 6 each  6 each Topical Daily Flora Lipps, MD   6 each at 03/30/20 1057  . docusate sodium (COLACE) capsule 100 mg  100 mg Oral BID PRN Flora Lipps, MD      . enoxaparin (LOVENOX) injection 30 mg  30 mg Subcutaneous Q24H Flora Lipps, MD   30 mg at 03/29/20 2128  . famotidine (PEPCID) IVPB 20 mg premix  20 mg Intravenous Q24H Kasa, Kurian, MD      . ipratropium-albuterol (DUONEB) 0.5-2.5 (3) MG/3ML nebulizer solution 3 mL  3 mL Nebulization Q4H PRN Tukov-Yual, Magdalene S, NP   3 mL at 03/29/20 2020  . ondansetron (ZOFRAN) injection 4 mg  4 mg Intravenous Q6H PRN Flora Lipps, MD       . polyethylene glycol (MIRALAX / GLYCOLAX) packet 17 g  17 g Oral Daily PRN Flora Lipps, MD      . sodium chloride flush (NS) 0.9 % injection 5 mL  5 mL Intracatheter Q8H Aletta Edouard, MD   5 mL at 03/30/20 0544  . vancomycin (VANCOREADY) IVPB 750 mg/150 mL  750 mg Intravenous Q24H Flora Lipps, MD 150 mL/hr at 03/30/20 0255 750 mg at 03/30/20 0255     Abtx:  Anti-infectives (From admission, onward)   Start     Dose/Rate Route Frequency Ordered Stop   03/30/20 0200  vancomycin (VANCOREADY) IVPB 750 mg/150 mL     Discontinue     750 mg 150 mL/hr over 60 Minutes Intravenous Every 24 hours 03/29/20 2154     03/29/20 2200  vancomycin (VANCOREADY) IVPB 500 mg/100 mL  Status:  Discontinued        500 mg 100 mL/hr over 60 Minutes Intravenous  Once 03/29/20 2152 03/29/20 2152   03/29/20 1900  cefTRIAXone (ROCEPHIN) 2 g in sodium chloride 0.9 % 100 mL IVPB     Discontinue     2 g 200 mL/hr over 30 Minutes Intravenous Every 24 hours 03/29/20 1801     03/29/20 1100  ceFEPIme (MAXIPIME) 2 g in sodium chloride 0.9 % 100 mL IVPB        2 g 200 mL/hr over 30 Minutes Intravenous  Once 03/29/20 1057 03/29/20 1201   03/29/20 1100  metroNIDAZOLE (FLAGYL) IVPB 500 mg        500 mg 100 mL/hr over 60 Minutes Intravenous  Once 03/29/20 1057 03/29/20 1221   03/29/20 1100  vancomycin (VANCOCIN) IVPB 1000 mg/200 mL premix        1,000 mg 200 mL/hr over 60 Minutes Intravenous  Once 03/29/20 1057 03/29/20 1248      REVIEW OF SYSTEMS:  Pt is hard of hearing  And some confusion Says she is feeling okay Not able to get a good review of system  Objective:  VITALS:  BP (!) 120/49   Pulse 73   Temp 99 F (37.2 C) (Oral)   Resp (!) 25   Ht 5\' 2"  (1.575 m)   Wt 67.1 kg   SpO2 94%   BMI 27.06 kg/m  PHYSICAL EXAM:  General:awake, weak, hard of hearing, some confusion  Head: Normocephalic, without obvious abnormality, atraumatic. Eyes: Conjunctivae clear, anicteric sclerae. Pupils are equal ENT  Nares normal. No drainage or sinus tenderness. Lips, mucosa, and tongue normal. No Thrush Neck: Supple Back: rt nephrostomy Lungs: b/l air entry-  few rhonchi Heart: s1s2 Abdomen: Soft, non-tender,not distended. Bowel sounds normal. No masses Extremities: atraumatic, no cyanosis. No edema. No clubbing Skin: No rashes or lesions. Or bruising Lymph: Cervical, supraclavicular normal. Neurologic: Grossly non-focal Pertinent Labs Lab Results CBC    Component Value Date/Time   WBC 7.3 03/30/2020 0521   RBC 3.88 03/30/2020 0521   HGB 12.8 03/30/2020 0521   HGB 10.9 (L) 01/03/2015 1109   HCT 36.5 03/30/2020 0521   HCT 34.9 (L) 01/03/2015 1109   PLT 69 (L) 03/30/2020 0521   PLT 150 01/03/2015 1109   MCV 94.1 03/30/2020 0521   MCV 78 (L) 01/03/2015 1109   MCH 33.0 03/30/2020 0521   MCHC 35.1 03/30/2020 0521   RDW 13.7 03/30/2020 0521   RDW 16.7 (H) 01/03/2015 1109   LYMPHSABS 0.3 (L) 03/29/2020 1104   LYMPHSABS 1.6 01/18/2014 0402   MONOABS 0.9 03/29/2020 1104   MONOABS 0.7 01/18/2014 0402   EOSABS 0.0 03/29/2020 1104   EOSABS 0.0 01/18/2014 0402   BASOSABS 0.1 03/29/2020 1104   BASOSABS 0.0 01/18/2014 0402    CMP Latest Ref Rng & Units 03/30/2020 03/29/2020 07/06/2015  Glucose 70 - 99 mg/dL 126(H) 120(H) 128(H)  BUN 8 - 23 mg/dL 25(H) 27(H) 17  Creatinine 0.44 - 1.00 mg/dL 1.13(H) 1.40(H) 0.68  Sodium 135 - 145 mmol/L 141 139 139  Potassium 3.5 - 5.1 mmol/L 3.3(L) 3.6 3.8  Chloride 98 - 111 mmol/L 108 110 106  CO2 22 - 32 mmol/L 23 21(L) 24  Calcium 8.9 - 10.3 mg/dL 8.2(L) 8.1(L) 8.7(L)  Total Protein 6.5 - 8.1 g/dL - 6.3(L) 6.8  Total Bilirubin 0.3 - 1.2 mg/dL - 1.0 0.3  Alkaline Phos 38 - 126 U/L - 72 88  AST 15 - 41 U/L - 23 31  ALT 0 - 44 U/L - 22 24      Microbiology: Recent Results (from the past 240 hour(s))  Blood Culture (routine x 2)     Status: None (Preliminary result)   Collection Time: 03/29/20 11:04 AM   Specimen: BLOOD  Result Value Ref Range Status    Specimen Description   Final    BLOOD BLOOD RIGHT ARM Performed at [facility], [address] [place], [place] [address]    Special Requests   Final    BOTTLES DRAWN AEROBIC AND ANAEROBIC Blood Culture adequate volume Performed at [facility], [address] [place], [place] [address]    Culture  Setup Time   Final    Organism ID to follow [place] AND ANAEROBIC BOTTLES CRITICAL RESULT CALLED TO, READ BACK BY AND VERIFIED WITH: JASON ROBBINS 03/29/20 AT 2135 BY AR    Culture GRAM POSITIVE COCCI  Final   Report Status PENDING  Incomplete  Blood Culture (routine x 2)     Status: None (Preliminary result)   Collection Time: 03/29/20 11:04 AM   Specimen: BLOOD  Result Value Ref Range Status   Specimen Description   Final    BLOOD BLOOD LEFT HAND Performed at [facility], [address] [place], [place] [address]    Special Requests   Final    BOTTLES DRAWN AEROBIC AND ANAEROBIC Blood Culture results may not be optimal due to an inadequate volume of blood received in culture bottles Performed at [facility], [address] [place], [place] [address]    Culture  Setup Time   Final    GRAM POSITIVE COCCI IN BOTH AEROBIC  AND ANAEROBIC BOTTLES CRITICAL VALUE NOTED.  VALUE IS CONSISTENT WITH PREVIOUSLY REPORTED AND CALLED VALUE.    Culture GRAM POSITIVE COCCI  Final   Report Status PENDING  Incomplete  Blood Culture ID Panel (Reflexed)     Status: Abnormal   Collection Time: 03/29/20 11:04 AM  Result Value Ref Range Status   Enterococcus species NOT DETECTED NOT DETECTED Final   Listeria monocytogenes NOT DETECTED NOT DETECTED Final   Staphylococcus species DETECTED (A) NOT DETECTED Final    Comment: CRITICAL RESULT CALLED TO, READ BACK BY AND VERIFIED WITH: JASON ROBBINS 03/29/20 AT 2135 BY ACR    Staphylococcus aureus (BCID) DETECTED (A) NOT DETECTED Final    Comment: Methicillin (oxacillin)-resistant  Staphylococcus aureus (MRSA). MRSA is predictably resistant to beta-lactam antibiotics (except ceftaroline). Preferred therapy is vancomycin unless clinically contraindicated. Patient requires contact precautions if  hospitalized. CRITICAL RESULT CALLED TO, READ BACK BY AND VERIFIED WITH: JASON ROBBINS 03/29/20 AT 2135 BY ACR    Methicillin resistance DETECTED (A) NOT DETECTED Final    Comment: CRITICAL RESULT CALLED TO, READ BACK BY AND VERIFIED WITH: JASON ROBBINS 03/29/20 AT 2135 BY ACR    Streptococcus species NOT DETECTED NOT DETECTED Final   Streptococcus agalactiae NOT DETECTED NOT DETECTED Final   Streptococcus pneumoniae NOT DETECTED NOT DETECTED Final   Streptococcus pyogenes NOT DETECTED NOT DETECTED Final   Acinetobacter baumannii NOT DETECTED NOT DETECTED Final   Enterobacteriaceae species NOT DETECTED NOT DETECTED Final   Enterobacter cloacae complex NOT DETECTED NOT DETECTED Final   Escherichia coli NOT DETECTED NOT DETECTED Final   Klebsiella oxytoca NOT DETECTED NOT DETECTED Final   Klebsiella pneumoniae NOT DETECTED NOT DETECTED Final   Proteus species NOT DETECTED NOT DETECTED Final   Serratia marcescens NOT DETECTED NOT DETECTED Final   Haemophilus influenzae NOT DETECTED NOT DETECTED Final   Neisseria meningitidis NOT DETECTED NOT DETECTED Final   Pseudomonas aeruginosa NOT DETECTED NOT DETECTED Final   Candida albicans NOT DETECTED NOT DETECTED Final   Candida glabrata NOT DETECTED NOT DETECTED Final   Candida krusei NOT DETECTED NOT DETECTED Final   Candida parapsilosis NOT DETECTED NOT DETECTED Final   Candida tropicalis NOT DETECTED NOT DETECTED Final    Comment: Performed at [facility], [address] [place], [place] [address]  SARS Coronavirus 2 by RT PCR (hospital order, performed in [facility] hospital lab) Nasopharyngeal Nasopharyngeal Swab     Status: None   Collection Time: 03/29/20 12:32 PM   Specimen: Nasopharyngeal Swab  Result Value  Ref Range Status   SARS Coronavirus 2 NEGATIVE NEGATIVE Final    Comment: (NOTE) SARS-CoV-2 target nucleic acids are NOT DETECTED.  The SARS-CoV-2 RNA is generally detectable in upper and lower respiratory specimens during the acute phase of infection. The lowest concentration of SARS-CoV-2 viral copies this assay can detect is 250 copies / mL. A negative result does not preclude SARS-CoV-2 infection and should not be used as the sole basis for treatment or other patient management decisions.  A negative result may occur with improper specimen collection / handling, submission of specimen other than nasopharyngeal swab, presence of viral mutation(s) within the areas targeted by this assay, and inadequate number of viral copies (<250 copies / mL). A negative result must be combined with clinical observations, patient history, and epidemiological information.  Fact Sheet for Patients:   StrictlyIdeas.no  Fact Sheet for Healthcare Providers: BankingDealers.co.za  This test is not yet approved or  cleared by the [place]  FDA and has been authorized for detection and/or diagnosis of SARS-CoV-2 by FDA under an Emergency Use Authorization (EUA).  This EUA will remain in effect (meaning this test can be used) for the duration of the COVID-19 declaration under Section 564(b)(1) of the Act, 21 U.S.C. section 360bbb-3(b)(1), unless the authorization is terminated or revoked sooner.  Performed at [facility], [address] [place], [place] [address]   Aerobic/Anaerobic Culture (surgical/deep wound)     Status: None (Preliminary result)   Collection Time: 03/29/20  6:25 PM   Specimen: Kidney; Urine  Result Value Ref Range Status   Specimen Description   Final    KIDNEY Performed at [facility], [address] [place], [place] [address]    Special Requests   Final    Normal Performed at [facility],  [place], [place] [address]    Gram Stain   Final    ABUNDANT WBC PRESENT, PREDOMINANTLY PMN ABUNDANT GRAM POSITIVE COCCI IN PAIRS IN CLUSTERS Performed at [facility], [place] [address], [place], [place] [address]    Culture PENDING  Incomplete   Report Status PENDING  Incomplete  MRSA PCR Screening     Status: Abnormal   Collection Time: 03/29/20  6:25 PM   Specimen: Nasal Mucosa; Nasopharyngeal  Result Value Ref Range Status   MRSA by PCR POSITIVE (A) NEGATIVE Final    Comment:        The GeneXpert MRSA Assay (FDA approved for NASAL specimens only), is one component of a comprehensive MRSA colonization surveillance program. It is not intended to diagnose MRSA infection nor to guide or monitor treatment for MRSA infections. RESULT CALLED TO, READ BACK BY AND VERIFIED WITH: RASHELLE RICHARDS 03/29/20 AT 1955 BY SL Performed at [facility], [address] [place],  [address]     IMAGING RESULTS: Low lung volumes without radiographic evidence of acute cardiopulmonary disease. I have personally reviewed the films ? Impression/Recommendation ? 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 usually this does not cause UTI or pyelo? Pt will need 2 d echo  B/L TKA   copd ___________________________________________________ Discussed with daughter and care team Note:  This document was prepared using Dragon voice recognition software and may include unintentional dictation errors.

## 2020-03-30 NOTE — Care Management (Signed)
This is a no charge note  Pick up from PCCM, Dr. Mortimer Fries  86 year old lady with past medical history of hypertension, COPD, CHF, GERD, who presents with septic shock secondary to infected right UPJ stone.  Pt is s/p of right nephrostomy tube placement by IR. Patient is stabilized. Current blood pressure 115/46. Patient did not require vasopressor. Pt is on broad antibiotics.  CT abd pelvis 1. 8 mm calculus at the right UPJ with resultant mild to moderate right hydronephrosis. 2. Additional nonobstructive right nephrolithiasis.   Ivor Costa, MD  [facility]   If 7PM-7AM, please contact night-coverage [email or web address] 03/30/2020, 9:11 AM

## 2020-03-30 NOTE — Progress Notes (Signed)
86 y.o.female who presented to the [facility] ER via EMS after her caregiver found her with AMS this morning.   CT abd/pel today revealed an obstructing 8 mm right UPJ stone associated with right sided hydronephrosis.   The patient is currently in septic shock with a lactate of 2.6, hypotension, WBC- 15 and serum creatinine of 1.4.  She is disoriented and unable answer historical questions and no family is at bedside.  3L fluids given in ER   CT abd pelvis 1. 8 mm calculus at the right UPJ with resultant mild to moderate right hydronephrosis. 2. Additional nonobstructive right nephrolithiasis.   7/16 IR CALLED TO EVALUATE PATIENT  Procedure:Right nephrostomy tube placement Complications: None Estimated Blood Loss:< 10 mL Findings: Right renal collecting system with grossly purulent urine. Sample sent for culture.  CHIEF COMPLAINT:   Chief Complaint  Patient presents with  . Altered Mental Status    Subjective  Follow up sepsis More alert and awake, not on pressors Given lasix last night     Review of Systems:  Gen:  Denies  fever, sweats, chills weight loss  HEENT: Denies blurred vision, double vision, ear pain, eye pain, hearing loss, nose bleeds, sore throat Cardiac:  No dizziness, chest pain or heaviness, chest tightness,edema, No JVD Resp:   No cough, -sputum production, -shortness of breath,-wheezing, -hemoptysis,  Other:  All other systems negative  Objective   Examination:  General exam: Appears calm and comfortable  Respiratory system: Clear to auscultation. Respiratory effort normal. HEENT: [place]/AT, PERRLA, no thrush, no stridor. Cardiovascular system: S1 & S2 heard, RRR. No JVD, murmurs, rubs, gallops or clicks. No pedal edema. Gastrointestinal system: Abdomen is nondistended, soft and nontender. No organomegaly or masses felt. Normal bowel sounds heard. Central nervous system: Alert and oriented. No focal neurological deficits. Extremities:  Symmetric 5 x 5 power. Skin: No rashes, lesions or ulcers Psychiatry: Judgement and insight appear normal. Mood & affect appropriate.   VITALS:  height is 5\' 2"  (1.575 m) and weight is 67.1 kg. Her oral temperature is 99 F (37.2 C). Her blood pressure is 115/46 (abnormal) and her pulse is 80. Her respiration is 26 (abnormal) and oxygen saturation is 94%.   I personally reviewed Labs under Results section.  Radiology Reports CT ABDOMEN PELVIS WO CONTRAST  Result Date: 03/29/2020 CLINICAL DATA:  Sepsis. EXAM: CT ABDOMEN AND PELVIS WITHOUT CONTRAST TECHNIQUE: Multidetector CT imaging of the abdomen and pelvis was performed following the standard protocol without IV contrast. COMPARISON:  None. FINDINGS: Lower chest: No acute abnormality. Bibasilar atelectasis/scarring. Emphysema in the medial right middle lobe. Hepatobiliary: Nodular liver contour. No focal liver abnormality is seen. Layering high-density sludge or small stones in the gallbladder neck. No gallbladder wall thickening or biliary dilatation. Pancreas: Unremarkable. No pancreatic ductal dilatation or surrounding inflammatory changes. Spleen: Moderate splenomegaly. Adrenals/Urinary Tract: 1.5 cm left adrenal adenoma. Right adrenal gland is unremarkable. 8 mm calculus at the right UPJ with resultant mild to moderate right hydronephrosis. Additional nonobstructive right renal calculi. 3.4 cm simple cyst in the upper pole of the right kidney. Subcentimeter low-density lesion in the midpole of the left kidney is too small to characterize. No left hydronephrosis. Multiple bladder diverticula. Stomach/Bowel: Small hiatal hernia. The stomach is otherwise within normal limits. No bowel wall thickening, distention, or surrounding inflammatory changes. There are a few colonic diverticula. Diminutive or absent appendix. Vascular/Lymphatic: Aortic atherosclerosis. No enlarged abdominal or pelvic lymph nodes. Reproductive: Status post hysterectomy. No  adnexal masses. Other: Small fat containing  umbilical hernia. No free fluid or pneumoperitoneum. Musculoskeletal: No acute or significant osseous findings. Prior right total hip arthroplasty. IMPRESSION: 1. 8 mm calculus at the right UPJ with resultant mild to moderate right hydronephrosis. 2. Additional nonobstructive right nephrolithiasis. 3. Cirrhosis with sequelae of portal hypertension including moderate splenomegaly. 4. Cholelithiasis versus sludge. 5. 1.5 cm left adrenal adenoma. 6. Aortic Atherosclerosis (ICD10-I70.0) and Emphysema (ICD10-J43.9). Electronically Signed   By: Titus Dubin M.D.   On: 03/29/2020 14:43   DG Chest Port 1 View  Result Date: 03/29/2020 CLINICAL DATA:  Respiratory distress EXAM: PORTABLE CHEST 1 VIEW COMPARISON:  Earlier same day FINDINGS: Stable chronic interstitial prominence with potential superimposed mild edema. Patchy bibasilar atelectasis. No pleural effusion or pneumothorax. Stable cardiomediastinal contours. IMPRESSION: Stable chronic interstitial prominence with potential superimposed mild edema. Patchy bibasilar atelectasis. Electronically Signed   By: Macy Mis M.D.   On: 03/29/2020 21:00   DG Chest Port 1 View  Result Date: 03/29/2020 CLINICAL DATA:  86 year old female with history of sepsis. EXAM: PORTABLE CHEST 1 VIEW COMPARISON:  Chest x-ray 07/06/2015. FINDINGS: Lung volumes are low. No consolidative airspace disease. No pleural effusions. No pneumothorax. No pulmonary nodule or mass noted. Pulmonary vasculature and the cardiomediastinal silhouette are within normal limits. Atherosclerotic calcifications in the thoracic aorta. Advanced degenerative changes of osteoarthritis are noted in shoulder joints bilaterally. IMPRESSION: 1. Low lung volumes without radiographic evidence of acute cardiopulmonary disease. 2. Aortic atherosclerosis. Electronically Signed   By: Vinnie Langton M.D.   On: 03/29/2020 11:36   IR NEPHROSTOMY PLACEMENT RIGHT  Result  Date: 03/29/2020 CLINICAL DATA:  Sepsis and right renal obstruction with hydronephrosis secondary to an obstructing UPJ calculus. The patient requires a right percutaneous nephrostomy tube for emergent decompression of the right kidney. EXAM: 1. ULTRASOUND GUIDANCE FOR PUNCTURE OF THE RIGHT RENAL COLLECTING SYSTEM. 2. LEFT PERCUTANEOUS NEPHROSTOMY TUBE PLACEMENT. COMPARISON:  None. ANESTHESIA/SEDATION: 0.5 mg IV Versed Total Moderate Sedation Time 10 minutes. The patient's level of consciousness and physiologic status were continuously monitored during the procedure by Radiology nursing. CONTRAST:  10 mL Visipaque 320 ml Omnipaque 300 MEDICATIONS: None FLUOROSCOPY TIME:  24 seconds.  3.3 mGy. PROCEDURE: The procedure, risks, benefits, and alternatives were explained to the patient. Questions regarding the procedure were encouraged and answered. The patient understands and consents to the procedure. A time-out was performed prior to initiating the procedure. The right flank region was prepped with chlorhexidine in a sterile fashion, and a sterile drape was applied covering the operative field. A sterile gown and sterile gloves were used for the procedure. Local anesthesia was provided with 1% Lidocaine. Ultrasound was used to localize the right kidney. Under direct ultrasound guidance, an 18 gauge trocar needle was advanced into the right renal collecting system. Ultrasound image documentation was performed. Aspiration of urine sample was performed followed by contrast injection. Percutaneous tract dilatation was then performed over the guidewire. A 10-French percutaneous nephrostomy tube was then advanced and formed in the collecting system. Catheter position was confirmed by fluoroscopy after contrast injection. The catheter was secured at the skin with a Prolene retention suture and Stat-Lock device. A gravity bag was placed. COMPLICATIONS: None. FINDINGS: There was return of grossly purulent urine from the right  renal collecting system. A sample was sent for culture analysis. After placement of the 10 French nephrostomy tube the catheter was formed at the level of the renal pelvis. There is good return of purulent and blood tinged urine. IMPRESSION: Right percutaneous nephrostomy tube placement. There  was return of grossly purulent urine. A sample was sent for culture analysis. A 10 French nephrostomy tube was placed and attached to gravity bag drainage. Electronically Signed   By: Aletta Edouard M.D.   On: 03/29/2020 17:43       Assessment/Plan:  Severe sepsis from infected kidney stone s/p nephrostomy tube placement Continue IV abx Gentle hydration Sepsis resolving  Transfer to [place]   Code Status: DNR/DNI    DVT/GI PRX ordered and assessed TRANSFUSIONS AS NEEDED MONITOR FSBS I Assessed the need for Labs I Assessed the need for Foley I Assessed the need for Central Venous Line Family Discussion when available I Assessed the need for Mobilization I made an Assessment of medications to be adjusted accordingly Safety Risk assessment completed   Corrin Parker, M.D.  [facility]  Medical Director [place] Director [facility] Cardio-Pulmonary Department

## 2020-03-30 NOTE — Progress Notes (Signed)
   Subjective S/p right NT placement by IR yesterday Afebrile overnight, no complaints this morning, feels improved, off pressors Daughter at bedside  Physical Exam: BP (!) 115/46 (BP Location: Right Arm)   Pulse 80   Temp 99 F (37.2 C) (Oral)   Resp (!) 26   Ht 5\' 2"  (1.575 m)   Wt 67.1 kg   SpO2 94%   BMI 27.06 kg/m    Constitutional:  Alert and oriented, No acute distress. Respiratory: Normal respiratory effort, no increased work of breathing. GI: Abdomen is soft, non-tender, non-distended GU: No CVA tenderness Drains: Right nephrostomy tube with yellow urine  Laboratory Data: Reviewed  Pertinent Imaging: I personally reviewed the CT showing an 8 mm right UPJ stone with upstream hydronephrosis and additional 9 mm right lower pole stone burden  Assessment & Plan:   86 year old female admitted with MRSA sepsis from urinary source and right 8 mm UPJ stone with upstream hydronephrosis, POD#1 right nephrostomy tube placement by interventional radiology.  Clinically improving.  I had a long conversation today with the patient and her daughter about the need for definitive management of her right-sided stone burden and follow-up with ureteroscopy, laser lithotripsy, and stent placement.  Her right nephrostomy tube will be removed at the time of that procedure.  We discussed that she will discharged with a nephrostomy tube to drainage, and will need 10 to 14 days of culture appropriate antibiotics.  -Maintain right nephrostomy tube to drainage, patient will discharge with nephrostomy tube -Recommend 10 to 14 days culture appropriate antibiotics -We will arrange outpatient urology follow-up in 1 to 2 weeks, tentatively plan for right ureteroscopy/laser lithotripsy/stent placement/nephrostomy tube removal in 2 to 4 weeks  I spent 40 total minutes on the floor with greater than 50% spent in counseling and coordination of care with the patient and her daughter regarding right UPJ  stone, MRSA uro-sepsis, and need for follow-up definitive stone removal with ureteroscopy in the future once clinically improved.   Billey Co, MD

## 2020-03-31 ENCOUNTER — Other Ambulatory Visit: Payer: Self-pay

## 2020-03-31 ENCOUNTER — Inpatient Hospital Stay
Admit: 2020-03-31 | Discharge: 2020-03-31 | Disposition: A | Discharge status: Home / Self Care | Payer: Medicare Other | Attending: Infectious Diseases | Admitting: Infectious Diseases

## 2020-03-31 DIAGNOSIS — G309 Alzheimer's disease, unspecified: Secondary | ICD-10-CM

## 2020-03-31 DIAGNOSIS — R7881 Bacteremia: Secondary | ICD-10-CM

## 2020-03-31 DIAGNOSIS — F0390 Unspecified dementia without behavioral disturbance: Secondary | ICD-10-CM

## 2020-03-31 DIAGNOSIS — B9562 Methicillin resistant Staphylococcus aureus infection as the cause of diseases classified elsewhere: Secondary | ICD-10-CM

## 2020-03-31 DIAGNOSIS — N200 Calculus of kidney: Secondary | ICD-10-CM

## 2020-03-31 DIAGNOSIS — F0280 Dementia in other diseases classified elsewhere without behavioral disturbance: Secondary | ICD-10-CM

## 2020-03-31 LAB — ECHOCARDIOGRAM COMPLETE
AR max vel: 1.83 cm2
AV Area VTI: 1.9 cm2
AV Area mean vel: 1.64 cm2
AV Mean grad: 6.4 mmHg
AV Peak grad: 10.8 mmHg
Ao pk vel: 1.64 m/s
Area-P 1/2: 3.93 cm2
Height: 62 in
S' Lateral: 2.05 cm
Weight: 2321 oz

## 2020-03-31 LAB — URINE CULTURE: Culture: >=100000 — AB

## 2020-03-31 LAB — PHOSPHORUS: Phosphorus: 2.1 mg/dL — ABNORMAL LOW (ref 2.5–4.6)

## 2020-03-31 LAB — BASIC METABOLIC PANEL
Anion gap: 5 (ref 5–15)
BUN: 25 mg/dL — ABNORMAL HIGH (ref 8–23)
CO2: 26 mmol/L (ref 22–32)
Calcium: 7.9 mg/dL — ABNORMAL LOW (ref 8.9–10.3)
Chloride: 109 mmol/L (ref 98–111)
Creatinine, Ser: 0.92 mg/dL (ref 0.44–1.00)
GFR calc Af Amer: >60 mL/min (ref >60)
GFR calc non Af Amer: 54 mL/min — ABNORMAL LOW (ref >60)
Glucose, Bld: 128 mg/dL — ABNORMAL HIGH (ref 70–99)
Potassium: 3.5 mmol/L (ref 3.5–5.1)
Sodium: 140 mmol/L (ref 135–145)

## 2020-03-31 LAB — MAGNESIUM: Magnesium: 1.9 mg/dL (ref 1.7–2.4)

## 2020-03-31 MED ORDER — ASPIRIN EC 81 MG PO TBEC
81.0000 mg | DELAYED_RELEASE_TABLET | Freq: Every day | ORAL | Status: DC
  Administered 2020-03-31 – 2020-04-03 (×4): 81 mg via ORAL
  Filled 2020-03-31 (×4): qty 1

## 2020-03-31 MED ORDER — LATANOPROST 0.005 % OP SOLN
1.0000 [drp] | Freq: Every day | OPHTHALMIC | Status: DC
  Administered 2020-03-31 – 2020-04-02 (×3): 1 [drp] via OPHTHALMIC
  Filled 2020-03-31 (×2): qty 2.5

## 2020-03-31 MED ORDER — SODIUM CHLORIDE 0.9 % IV SOLN
INTRAVENOUS | Status: DC | PRN
  Administered 2020-03-31: 100 mL via INTRAVENOUS
  Administered 2020-04-02: 250 mL via INTRAVENOUS

## 2020-03-31 MED ORDER — ENOXAPARIN SODIUM 40 MG/0.4ML ~~LOC~~ SOLN
40.0000 mg | SUBCUTANEOUS | Status: DC
  Administered 2020-03-31 – 2020-04-02 (×3): 40 mg via SUBCUTANEOUS
  Filled 2020-03-31 (×3): qty 0.4

## 2020-03-31 MED ORDER — LOSARTAN POTASSIUM 50 MG PO TABS
50.0000 mg | ORAL_TABLET | Freq: Every day | ORAL | Status: DC
  Administered 2020-03-31 – 2020-04-03 (×4): 50 mg via ORAL
  Filled 2020-03-31 (×4): qty 1

## 2020-03-31 MED ORDER — DONEPEZIL HCL 5 MG PO TABS
10.0000 mg | ORAL_TABLET | Freq: Every day | ORAL | Status: DC
  Administered 2020-03-31 – 2020-04-02 (×3): 10 mg via ORAL
  Filled 2020-03-31 (×5): qty 2

## 2020-03-31 MED ORDER — MEMANTINE HCL 5 MG PO TABS
10.0000 mg | ORAL_TABLET | Freq: Two times a day (BID) | ORAL | Status: DC
  Administered 2020-03-31 – 2020-04-03 (×7): 10 mg via ORAL
  Filled 2020-03-31: qty 1
  Filled 2020-03-31 (×3): qty 2
  Filled 2020-03-31: qty 1
  Filled 2020-03-31 (×3): qty 2

## 2020-03-31 MED ORDER — MONTELUKAST SODIUM 10 MG PO TABS
10.0000 mg | ORAL_TABLET | ORAL | Status: DC
  Administered 2020-04-01 – 2020-04-03 (×3): 10 mg via ORAL
  Filled 2020-03-31 (×3): qty 1

## 2020-03-31 MED ORDER — POTASSIUM & SODIUM PHOSPHATES 280-160-250 MG PO PACK
2.0000 | PACK | ORAL | Status: AC
  Administered 2020-03-31 (×2): 2 via ORAL
  Filled 2020-03-31 (×4): qty 2

## 2020-03-31 MED ORDER — VANCOMYCIN HCL IN DEXTROSE 1-5 GM/200ML-% IV SOLN
1000.0000 mg | INTRAVENOUS | Status: DC
  Administered 2020-04-01 – 2020-04-03 (×3): 1000 mg via INTRAVENOUS
  Filled 2020-03-31 (×5): qty 200

## 2020-03-31 MED ORDER — ACETAMINOPHEN 325 MG PO TABS
650.0000 mg | ORAL_TABLET | Freq: Three times a day (TID) | ORAL | Status: DC
  Administered 2020-03-31 – 2020-04-03 (×8): 650 mg via ORAL
  Filled 2020-03-31 (×9): qty 2

## 2020-03-31 NOTE — Progress Notes (Signed)
Pharmacy Antibiotic Note  Brittney Wood is a 86 y.o. female admitted on 03/29/2020. Patient with 8 mm right UPJ stone with upstream hydronephrosis and additional 9 mm right lower pole stone burden. Currently with MRSA bacteremia as well as urine culture with MRSA, ID following. Pharmacy has been consulted for vancomycin dosing.  Plan: Increase vancomycin to 1000 mg q24h for improvement in renal function  Height: 5\' 2"  (157.5 cm) Weight: 65.8 kg (145 lb 1 oz) IBW/kg (Calculated) : 50.1  Temp (24hrs), Avg:98.3 F (36.8 C), Min:97.6 F (36.4 C), Max:99.2 F (37.3 C)  Recent Labs  Lab 03/29/20 1104 03/29/20 1232 03/30/20 0521 03/31/20 0329  WBC 15.3*  --  7.3  --   CREATININE 1.40*  --  1.13* 0.92  LATICACIDVEN 2.6* 2.9*  --   --     Estimated Creatinine Clearance: 34.7 mL/min (by C-G formula based on SCr of 0.92 mg/dL).    Allergies  Allergen Reactions  . Buprenorphine Hcl     Other reaction(s): Other (See Comments) Hallucinations  . Morphine And Related Other (See Comments)    Hallucinations    Antimicrobials this admission: Ceftriaxone 7/16 >> 7/17 Vancomycin 7/16 >>  Dose adjustments this admission: 7/18 Vanc 750 mg q24h >> 1000 mg q24h  Microbiology results: 7/16 BCx: MRSA 7/16 UCx: MRSA  7/16 MRSA PCR: positive  Thank you for allowing pharmacy to be a part of this patient's care.  Tawnya Crook 03/31/2020 10:27 AM

## 2020-03-31 NOTE — Progress Notes (Signed)
PHARMACY CONSULT NOTE - FOLLOW UP  Pharmacy Consult for Electrolyte Monitoring and Replacement   Recent Labs: Potassium (mmol/L)  Date Value  03/31/2020 3.5  01/03/2015 3.9   Magnesium (mg/dL)  Date Value  03/31/2020 1.9   Calcium (mg/dL)  Date Value  03/31/2020 7.9 (L)   Calcium, Total (mg/dL)  Date Value  01/03/2015 8.2 (L)   Albumin (g/dL)  Date Value  03/29/2020 3.1 (L)  01/18/2014 2.9 (L)   Phosphorus (mg/dL)  Date Value  03/31/2020 2.1 (L)   Sodium (mmol/L)  Date Value  03/31/2020 140  01/03/2015 134 (L)     Assessment: 86 year old female with MRSA bacteremia. Patient with 8 mm right UPJ stone with upstream hydronephrosis and additional 9 mm right lower pole stone burden.   Goal of Therapy:  Electrolytes WNL  Plan:  PHOS NAK 2 packets q4h x 2 doses. Follow up electrolytes with morning labs.  Tawnya Crook ,PharmD Clinical Pharmacist 03/31/2020 10:20 AM

## 2020-03-31 NOTE — Progress Notes (Signed)
*  PRELIMINARY RESULTS* Echocardiogram 2D Echocardiogram has been performed.  Brittney Wood 03/31/2020, 9:53 AM

## 2020-03-31 NOTE — Progress Notes (Addendum)
PROGRESS NOTE    Brittney Wood  DJM:[id number] DOB: Mar 20, 1925 DOA: 03/29/2020 PCP: Patient, No Pcp Per    Brief Narrative:  86 year old lady with past medical history of hypertension, COPD, CHF, GERD, who presents with septic shock secondary to infected right UPJ stone.  Pt is s/p of right nephrostomy tube placement by IR. Patient is stabilized. Current blood pressure 115/46. Patient did not require vasopressor. Pt is on broad antibiotics.  7/18: Patient seen and examined.  Hemodynamically stable.  No fevers noted over interval.  Daughter at bedside.  Stable for transfer to general medical floor.  Daughter and patient updated on plan of care.  Assessment & Plan:   Active Problems:   Septic shock (HCC)  Infected kidney stone status post nephrostomy tube placement MRSA bacteremia Severe sepsis secondary to above Status post nephrostomy Urine culture with staph aureus Nephrostomy urine culture gram-positive cocci Was on vancomycin and Rocephin, Rocephin discontinued Unclear source of MRSA bacteremia.  Patient colonized in nares Plan: Transfer to MedSurg Continue IV antibiotics, currently on vancomycin Continue gentle IV fluid hydration Monitor fever curve Follow cultures Urology following Infectious disease following TTE ordered  COPD Currently not exacerbated and on room air As needed nebs Continue montelukast  Hypertension Continue Cozaar at half of home dose, 50 mg daily  Dementia Continue donepezil and memantine   DVT prophylaxis: Lovenox Code Status: DNR Family Communication: Daughter at bedside Disposition Plan: Status is: Inpatient  Remains inpatient appropriate because:Inpatient level of care appropriate due to severity of illness   Dispo: The patient is from: Home              Anticipated d/c is to: Home              Anticipated d/c date is: 2 days              Patient currently is not medically stable to d/c.  Transferring out of ICU today.  Still  with MRSA bacteremia requiring additional work-up.   Consultants:   ID  Urology  Procedures:   Right right nephrostomy tube  Antimicrobials:   Vancomycin   Subjective: Patient seen and examined.  Somewhat confused but may be related selective of underlying dementia.  Daughter at bedside.  All questions answered.  Patient in no visible distress.  Objective: Vitals:   03/31/20 0000 03/31/20 0400 03/31/20 0500 03/31/20 0800  BP: (!) 144/62 (!) 133/52  (!) 146/67  Pulse: 71 70  68  Resp: (!) 21 19  20   Temp:    97.6 F (36.4 C)  TempSrc:    Oral  SpO2: 97% 96%  96%  Weight:   65.8 kg   Height:        Intake/Output Summary (Last 24 hours) at 03/31/2020 1140 Last data filed at 03/31/2020 0800 Gross per 24 hour  Intake 320 ml  Output 400 ml  Net -80 ml   Filed Weights   03/29/20 1045 03/29/20 1636 03/31/20 0500  Weight: 67.1 kg 67.1 kg 65.8 kg    Examination:  General exam: Appears calm and comfortable  Respiratory system: Clear to auscultation. Respiratory effort normal. Cardiovascular system: S1 & S2 heard, RRR. No JVD, murmurs, rubs, gallops or clicks. No pedal edema. Gastrointestinal system: Nontender, nondistended, positive bowel sounds, right nephrostomy tube Central nervous system: Alert, oriented x2.  No focal deficits Extremities: Symmetric 5 x 5 power. Skin: No rashes, lesions or ulcers Psychiatry: Confused    Data Reviewed: I have personally reviewed following labs and imaging  studies  CBC: Recent Labs  Lab 03/29/20 1104 03/30/20 0521  WBC 15.3* 7.3  NEUTROABS 13.9*  --   HGB 13.6 12.8  HCT 40.3 36.5  MCV 97.1 94.1  PLT 77* 69*   Basic Metabolic Panel: Recent Labs  Lab 03/29/20 1104 03/30/20 0521 03/31/20 0329  NA 139 141 140  K 3.6 3.3* 3.5  CL 110 108 109  CO2 21* 23 26  GLUCOSE 120* 126* 128*  BUN 27* 25* 25*  CREATININE 1.40* 1.13* 0.92  CALCIUM 8.1* 8.2* 7.9*  MG  --  1.8 1.9  PHOS  --  3.3 2.1*   GFR: Estimated  Creatinine Clearance: 34.7 mL/min (by C-G formula based on SCr of 0.92 mg/dL). Liver Function Tests: Recent Labs  Lab 03/29/20 1104  AST 23  ALT 22  ALKPHOS 72  BILITOT 1.0  PROT 6.3*  ALBUMIN 3.1*   No results for input(s): LIPASE, AMYLASE in the last 168 hours. No results for input(s): AMMONIA in the last 168 hours. Coagulation Profile: Recent Labs  Lab 03/29/20 1104  INR 1.3*   Cardiac Enzymes: No results for input(s): CKTOTAL, CKMB, CKMBINDEX, TROPONINI in the last 168 hours. BNP (last 3 results) No results for input(s): PROBNP in the last 8760 hours. HbA1C: No results for input(s): HGBA1C in the last 72 hours. CBG: Recent Labs  Lab 03/29/20 1806  GLUCAP 117*   Lipid Profile: No results for input(s): CHOL, HDL, LDLCALC, TRIG, CHOLHDL, LDLDIRECT in the last 72 hours. Thyroid Function Tests: No results for input(s): TSH, T4TOTAL, FREET4, T3FREE, THYROIDAB in the last 72 hours. Anemia Panel: No results for input(s): VITAMINB12, FOLATE, FERRITIN, TIBC, IRON, RETICCTPCT in the last 72 hours. Sepsis Labs: Recent Labs  Lab 03/29/20 1104 03/29/20 1232  LATICACIDVEN 2.6* 2.9*    Recent Results (from the past 240 hour(s))  Blood Culture (routine x 2)     Status: Abnormal (Preliminary result)   Collection Time: 03/29/20 11:04 AM   Specimen: BLOOD  Result Value Ref Range Status   Specimen Description   Final    BLOOD BLOOD RIGHT ARM Performed at [facility], [address], [place], [place] [address]    Special Requests   Final    BOTTLES DRAWN AEROBIC AND ANAEROBIC Blood Culture adequate volume Performed at [facility], [address] [place], [place] [address]    Culture  Setup Time   Final    GRAM POSITIVE COCCI IN BOTH AEROBIC AND ANAEROBIC BOTTLES CRITICAL RESULT CALLED TO, READ BACK BY AND VERIFIED WITH: JASON ROBBINS 03/29/20 AT 2135 BY AR    Culture STAPHYLOCOCCUS AUREUS (A)  Final   Report Status PENDING  Incomplete  Blood  Culture (routine x 2)     Status: Abnormal (Preliminary result)   Collection Time: 03/29/20 11:04 AM   Specimen: BLOOD  Result Value Ref Range Status   Specimen Description   Final    BLOOD BLOOD LEFT HAND Performed at [facility], [address], [place], [place] [address]    Special Requests   Final    BOTTLES DRAWN AEROBIC AND ANAEROBIC Blood Culture results may not be optimal due to an inadequate volume of blood received in culture bottles Performed at [facility], [address] [place], [place] [address]    Culture  Setup Time   Final    GRAM POSITIVE COCCI IN BOTH AEROBIC AND ANAEROBIC BOTTLES CRITICAL VALUE NOTED.  VALUE IS CONSISTENT WITH PREVIOUSLY REPORTED AND CALLED VALUE.  Culture STAPHYLOCOCCUS AUREUS (A)  Final   Report Status PENDING  Incomplete  Urine culture     Status: Abnormal   Collection Time: 03/29/20 11:04 AM   Specimen: Urine, Random  Result Value Ref Range Status   Specimen Description   Final    URINE, RANDOM Performed at [facility], [address] [place], [place] [address]    Special Requests   Final    NONE Performed at [facility], [place], [place] [address]    Culture (A)  Final    >=100,000 COLONIES/mL METHICILLIN RESISTANT STAPHYLOCOCCUS AUREUS   Report Status 03/31/2020 FINAL  Final   Organism ID, Bacteria METHICILLIN RESISTANT STAPHYLOCOCCUS AUREUS (A)  Final      Susceptibility   Methicillin resistant staphylococcus aureus - MIC*    CIPROFLOXACIN >=8 RESISTANT Resistant     GENTAMICIN <=0.5 SENSITIVE Sensitive     NITROFURANTOIN <=16 SENSITIVE Sensitive     OXACILLIN >=4 RESISTANT Resistant     TETRACYCLINE <=1 SENSITIVE Sensitive     VANCOMYCIN <=0.5 SENSITIVE Sensitive     TRIMETH/SULFA <=10 SENSITIVE Sensitive     CLINDAMYCIN <=0.25 SENSITIVE Sensitive     RIFAMPIN <=0.5 SENSITIVE Sensitive     Inducible Clindamycin NEGATIVE Sensitive     * >=100,000 COLONIES/mL  METHICILLIN RESISTANT STAPHYLOCOCCUS AUREUS  Blood Culture ID Panel (Reflexed)     Status: Abnormal   Collection Time: 03/29/20 11:04 AM  Result Value Ref Range Status   Enterococcus species NOT DETECTED NOT DETECTED Final   Listeria monocytogenes NOT DETECTED NOT DETECTED Final   Staphylococcus species DETECTED (A) NOT DETECTED Final    Comment: CRITICAL RESULT CALLED TO, READ BACK BY AND VERIFIED WITH: JASON ROBBINS 03/29/20 AT 2135 BY ACR    Staphylococcus aureus (BCID) DETECTED (A) NOT DETECTED Final    Comment: Methicillin (oxacillin)-resistant Staphylococcus aureus (MRSA). MRSA is predictably resistant to beta-lactam antibiotics (except ceftaroline). Preferred therapy is vancomycin unless clinically contraindicated. Patient requires contact precautions if  hospitalized. CRITICAL RESULT CALLED TO, READ BACK BY AND VERIFIED WITH: JASON ROBBINS 03/29/20 AT 2135 BY ACR    Methicillin resistance DETECTED (A) NOT DETECTED Final    Comment: CRITICAL RESULT CALLED TO, READ BACK BY AND VERIFIED WITH: JASON ROBBINS 03/29/20 AT 2135 BY ACR    Streptococcus species NOT DETECTED NOT DETECTED Final   Streptococcus agalactiae NOT DETECTED NOT DETECTED Final   Streptococcus pneumoniae NOT DETECTED NOT DETECTED Final   Streptococcus pyogenes NOT DETECTED NOT DETECTED Final   Acinetobacter baumannii NOT DETECTED NOT DETECTED Final   Enterobacteriaceae species NOT DETECTED NOT DETECTED Final   Enterobacter cloacae complex NOT DETECTED NOT DETECTED Final   Escherichia coli NOT DETECTED NOT DETECTED Final   Klebsiella oxytoca NOT DETECTED NOT DETECTED Final   Klebsiella pneumoniae NOT DETECTED NOT DETECTED Final   Proteus species NOT DETECTED NOT DETECTED Final   Serratia marcescens NOT DETECTED NOT DETECTED Final   Haemophilus influenzae NOT DETECTED NOT DETECTED Final   Neisseria meningitidis NOT DETECTED NOT DETECTED Final   Pseudomonas aeruginosa NOT DETECTED NOT DETECTED Final   Candida  albicans NOT DETECTED NOT DETECTED Final   Candida glabrata NOT DETECTED NOT DETECTED Final   Candida krusei NOT DETECTED NOT DETECTED Final   Candida parapsilosis NOT DETECTED NOT DETECTED Final   Candida tropicalis NOT DETECTED NOT DETECTED Final    Comment: Performed at [facility], [address] [place],  [address]  SARS Coronavirus 2 by RT PCR (hospital  order, performed in [facility] hospital lab) Nasopharyngeal Nasopharyngeal Swab     Status: None   Collection Time: 03/29/20 12:32 PM   Specimen: Nasopharyngeal Swab  Result Value Ref Range Status   SARS Coronavirus 2 NEGATIVE NEGATIVE Final    Comment: (NOTE) SARS-CoV-2 target nucleic acids are NOT DETECTED.  The SARS-CoV-2 RNA is generally detectable in upper and lower respiratory specimens during the acute phase of infection. The lowest concentration of SARS-CoV-2 viral copies this assay can detect is 250 copies / mL. A negative result does not preclude SARS-CoV-2 infection and should not be used as the sole basis for treatment or other patient management decisions.  A negative result may occur with improper specimen collection / handling, submission of specimen other than nasopharyngeal swab, presence of viral mutation(s) within the areas targeted by this assay, and inadequate number of viral copies (<250 copies / mL). A negative result must be combined with clinical observations, patient history, and epidemiological information.  Fact Sheet for Patients:   StrictlyIdeas.no  Fact Sheet for Healthcare Providers: BankingDealers.co.za  This test is not yet approved or  cleared by the [place] FDA and has been authorized for detection and/or diagnosis of SARS-CoV-2 by FDA under an Emergency Use Authorization (EUA).  This EUA will remain in effect (meaning this test can be used) for the duration of the COVID-19 declaration under Section 564(b)(1) of the  Act, 21 U.S.C. section 360bbb-3(b)(1), unless the authorization is terminated or revoked sooner.  Performed at [facility] [address] [place], [place] [address]   Aerobic/Anaerobic Culture (surgical/deep wound)     Status: None (Preliminary result)   Collection Time: 03/29/20  6:25 PM   Specimen: Kidney; Urine  Result Value Ref Range Status   Specimen Description   Final    KIDNEY Performed at [facility], [address] [place], [place] [address]    Special Requests   Final    Normal Performed at [facility], [address] [place], [place] [address]    Gram Stain   Final    ABUNDANT WBC PRESENT, PREDOMINANTLY PMN ABUNDANT GRAM POSITIVE COCCI IN PAIRS IN CLUSTERS    Culture   Final    ABUNDANT STAPHYLOCOCCUS AUREUS CULTURE REINCUBATED FOR BETTER GROWTH Performed at [facility], [place] [address], [place], [place] [address]    Report Status PENDING  Incomplete  MRSA PCR Screening     Status: Abnormal   Collection Time: 03/29/20  6:25 PM   Specimen: Nasal Mucosa; Nasopharyngeal  Result Value Ref Range Status   MRSA by PCR POSITIVE (A) NEGATIVE Final    Comment:        The GeneXpert MRSA Assay (FDA approved for NASAL specimens only), is one component of a comprehensive MRSA colonization surveillance program. It is not intended to diagnose MRSA infection nor to guide or monitor treatment for MRSA infections. RESULT CALLED TO, READ BACK BY AND VERIFIED WITH: RASHELLE RICHARDS 03/29/20 AT 1955 BY SL Performed at [facility], [address] [place] [address], [place], [place] [address]          Radiology Studies: CT ABDOMEN PELVIS WO CONTRAST  Result Date: 03/29/2020 CLINICAL DATA:  Sepsis. EXAM: CT ABDOMEN AND PELVIS WITHOUT CONTRAST TECHNIQUE: Multidetector CT imaging of the abdomen and pelvis was performed following the standard protocol without IV contrast. COMPARISON:  None. FINDINGS: Lower chest: No acute abnormality.  Bibasilar atelectasis/scarring. Emphysema in the medial right middle lobe. Hepatobiliary: Nodular liver contour. No focal liver  abnormality is seen. Layering high-density sludge or small stones in the gallbladder neck. No gallbladder wall thickening or biliary dilatation. Pancreas: Unremarkable. No pancreatic ductal dilatation or surrounding inflammatory changes. Spleen: Moderate splenomegaly. Adrenals/Urinary Tract: 1.5 cm left adrenal adenoma. Right adrenal gland is unremarkable. 8 mm calculus at the right UPJ with resultant mild to moderate right hydronephrosis. Additional nonobstructive right renal calculi. 3.4 cm simple cyst in the upper pole of the right kidney. Subcentimeter low-density lesion in the midpole of the left kidney is too small to characterize. No left hydronephrosis. Multiple bladder diverticula. Stomach/Bowel: Small hiatal hernia. The stomach is otherwise within normal limits. No bowel wall thickening, distention, or surrounding inflammatory changes. There are a few colonic diverticula. Diminutive or absent appendix. Vascular/Lymphatic: Aortic atherosclerosis. No enlarged abdominal or pelvic lymph nodes. Reproductive: Status post hysterectomy. No adnexal masses. Other: Small fat containing umbilical hernia. No free fluid or pneumoperitoneum. Musculoskeletal: No acute or significant osseous findings. Prior right total hip arthroplasty. IMPRESSION: 1. 8 mm calculus at the right UPJ with resultant mild to moderate right hydronephrosis. 2. Additional nonobstructive right nephrolithiasis. 3. Cirrhosis with sequelae of portal hypertension including moderate splenomegaly. 4. Cholelithiasis versus sludge. 5. 1.5 cm left adrenal adenoma. 6. Aortic Atherosclerosis (ICD10-I70.0) and Emphysema (ICD10-J43.9). Electronically Signed   By: Titus Dubin M.D.   On: 03/29/2020 14:43   DG Chest Port 1 View  Result Date: 03/30/2020 CLINICAL DATA:  86 year old female with respiratory distress EXAM: PORTABLE  CHEST 1 VIEW COMPARISON:  03/30/2019 FINDINGS: Cardiomediastinal silhouette unchanged in size and contour. Mixed reticulonodular opacities of the bilateral lungs are unchanged from prior. No pneumothorax. No pleural effusion. No new confluent airspace disease. IMPRESSION: Unchanged appearance of the chest x-ray, with mild bilateral mixed reticulonodular opacities. Electronically Signed   By: Corrie Mckusick D.O.   On: 03/30/2020 09:14   DG Chest Port 1 View  Result Date: 03/29/2020 CLINICAL DATA:  Respiratory distress EXAM: PORTABLE CHEST 1 VIEW COMPARISON:  Earlier same day FINDINGS: Stable chronic interstitial prominence with potential superimposed mild edema. Patchy bibasilar atelectasis. No pleural effusion or pneumothorax. Stable cardiomediastinal contours. IMPRESSION: Stable chronic interstitial prominence with potential superimposed mild edema. Patchy bibasilar atelectasis. Electronically Signed   By: Macy Mis M.D.   On: 03/29/2020 21:00   ECHOCARDIOGRAM COMPLETE  Result Date: 03/31/2020    ECHOCARDIOGRAM REPORT   Patient Name:   [facility] Date of Exam: 03/31/2020 Medical Rec #:  [id number]    Height:       62.0 in Accession #:    [id number]   Weight:       145.1 lb Date of Birth:  1925/06/05    BSA:          1.668 m Patient Age:    15 years     BP:           146/67 mmHg Patient Gender: F            HR:           68 bpm. Exam Location:  [facility] Procedure: 2D Echo, Cardiac Doppler and Color Doppler Indications:     Bacteremia 790.7  History:         Patient has no prior history of Echocardiogram examinations.                  CHF, COPD; Risk Factors:Hypertension.  Sonographer:     Sherrie Sport RDCS (AE) Referring Phys:  [id number] Tsosie Billing Diagnosing Phys: Isaias Cowman MD  Sonographer Comments: Suboptimal  apical window and no subcostal window. IMPRESSIONS  1. Left ventricular ejection fraction, by estimation, is 60 to 65%. The left ventricle has normal function. The left ventricle has  no regional wall motion abnormalities. Left ventricular diastolic parameters are consistent with Grade I diastolic dysfunction (impaired relaxation).  2. Right ventricular systolic function is normal. The right ventricular size is normal. There is moderately elevated pulmonary artery systolic pressure.  3. The mitral valve is normal in structure. Mild mitral valve regurgitation. No evidence of mitral stenosis.  4. The aortic valve is normal in structure. Aortic valve regurgitation is not visualized. No aortic stenosis is present.  5. The inferior vena cava is normal in size with greater than 50% respiratory variability, suggesting right atrial pressure of 3 mmHg. FINDINGS  Left Ventricle: Left ventricular ejection fraction, by estimation, is 60 to 65%. The left ventricle has normal function. The left ventricle has no regional wall motion abnormalities. The left ventricular internal cavity size was normal in size. There is  no left ventricular hypertrophy. Left ventricular diastolic parameters are consistent with Grade I diastolic dysfunction (impaired relaxation). Right Ventricle: The right ventricular size is normal. No increase in right ventricular wall thickness. Right ventricular systolic function is normal. There is moderately elevated pulmonary artery systolic pressure. The tricuspid regurgitant velocity is 3.05 m/s, and with an assumed right atrial pressure of 10 mmHg, the estimated right ventricular systolic pressure is 42.5 mmHg. Left Atrium: Left atrial size was normal in size. Right Atrium: Right atrial size was normal in size. Pericardium: There is no evidence of pericardial effusion. Mitral Valve: The mitral valve is normal in structure. Normal mobility of the mitral valve leaflets. Mild mitral valve regurgitation. No evidence of mitral valve stenosis. Tricuspid Valve: The tricuspid valve is normal in structure. Tricuspid valve regurgitation is mild . No evidence of tricuspid stenosis. Aortic Valve: The  aortic valve is normal in structure. Aortic valve regurgitation is not visualized. No aortic stenosis is present. Aortic valve mean gradient measures 6.4 mmHg. Aortic valve peak gradient measures 10.8 mmHg. Aortic valve area, by VTI measures 1.90 cm. Pulmonic Valve: The pulmonic valve was normal in structure. Pulmonic valve regurgitation is not visualized. No evidence of pulmonic stenosis. Aorta: The aortic root is normal in size and structure. Venous: The inferior vena cava is normal in size with greater than 50% respiratory variability, suggesting right atrial pressure of 3 mmHg. IAS/Shunts: No atrial level shunt detected by color flow Doppler.  LEFT VENTRICLE PLAX 2D LVIDd:         3.03 cm  Diastology LVIDs:         2.05 cm  LV e' lateral:   3.92 cm/s LV PW:         0.99 cm  LV E/e' lateral: 22.8 LV IVS:        0.92 cm  LV e' medial:    3.59 cm/s LVOT diam:     2.00 cm  LV E/e' medial:  24.9 LV SV:         71 LV SV Index:   43 LVOT Area:     3.14 cm  RIGHT VENTRICLE RV Basal diam:  2.96 cm RV S prime:     17.30 cm/s TAPSE (M-mode): 3.8 cm LEFT ATRIUM             Index       RIGHT ATRIUM           Index LA diam:  3.40 cm 2.04 cm/m  RA Area:     18.40 cm LA Vol (A2C):   48.2 ml 28.90 ml/m RA Volume:   54.70 ml  32.80 ml/m LA Vol (A4C):   48.6 ml 29.14 ml/m LA Biplane Vol: 52.0 ml 31.18 ml/m  AORTIC VALVE                    PULMONIC VALVE AV Area (Vmax):    1.83 cm     PV Vmax:        0.59 m/s AV Area (Vmean):   1.64 cm     PV Peak grad:   1.4 mmHg AV Area (VTI):     1.90 cm     RVOT Peak grad: 3 mmHg AV Vmax:           164.00 cm/s AV Vmean:          118.980 cm/s AV VTI:            0.374 m AV Peak Grad:      10.8 mmHg AV Mean Grad:      6.4 mmHg LVOT Vmax:         95.30 cm/s LVOT Vmean:        62.300 cm/s LVOT VTI:          0.226 m LVOT/AV VTI ratio: 0.60  AORTA Ao Root diam: 2.40 cm MITRAL VALVE                TRICUSPID VALVE MV Area (PHT): 3.93 cm     TR Peak grad:   37.2 mmHg MV Decel Time: 193  msec     TR Vmax:        305.00 cm/s MV E velocity: 89.50 cm/s MV A velocity: 127.00 cm/s  SHUNTS MV E/A ratio:  0.70         Systemic VTI:  0.23 m                             Systemic Diam: 2.00 cm Isaias Cowman MD Electronically signed by Isaias Cowman MD Signature Date/Time: 03/31/2020/10:28:49 AM    Final    IR NEPHROSTOMY PLACEMENT RIGHT  Result Date: 03/29/2020 CLINICAL DATA:  Sepsis and right renal obstruction with hydronephrosis secondary to an obstructing UPJ calculus. The patient requires a right percutaneous nephrostomy tube for emergent decompression of the right kidney. EXAM: 1. ULTRASOUND GUIDANCE FOR PUNCTURE OF THE RIGHT RENAL COLLECTING SYSTEM. 2. LEFT PERCUTANEOUS NEPHROSTOMY TUBE PLACEMENT. COMPARISON:  None. ANESTHESIA/SEDATION: 0.5 mg IV Versed Total Moderate Sedation Time 10 minutes. The patient's level of consciousness and physiologic status were continuously monitored during the procedure by Radiology nursing. CONTRAST:  10 mL Visipaque 320 ml Omnipaque 300 MEDICATIONS: None FLUOROSCOPY TIME:  24 seconds.  3.3 mGy. PROCEDURE: The procedure, risks, benefits, and alternatives were explained to the patient. Questions regarding the procedure were encouraged and answered. The patient understands and consents to the procedure. A time-out was performed prior to initiating the procedure. The right flank region was prepped with chlorhexidine in a sterile fashion, and a sterile drape was applied covering the operative field. A sterile gown and sterile gloves were used for the procedure. Local anesthesia was provided with 1% Lidocaine. Ultrasound was used to localize the right kidney. Under direct ultrasound guidance, an 18 gauge trocar needle was advanced into the right renal collecting system. Ultrasound image documentation was performed. Aspiration of urine sample was performed followed by contrast  injection. Percutaneous tract dilatation was then performed over the guidewire. A  10-French percutaneous nephrostomy tube was then advanced and formed in the collecting system. Catheter position was confirmed by fluoroscopy after contrast injection. The catheter was secured at the skin with a Prolene retention suture and Stat-Lock device. A gravity bag was placed. COMPLICATIONS: None. FINDINGS: There was return of grossly purulent urine from the right renal collecting system. A sample was sent for culture analysis. After placement of the 10 French nephrostomy tube the catheter was formed at the level of the renal pelvis. There is good return of purulent and blood tinged urine. IMPRESSION: Right percutaneous nephrostomy tube placement. There was return of grossly purulent urine. A sample was sent for culture analysis. A 10 French nephrostomy tube was placed and attached to gravity bag drainage. Electronically Signed   By: Aletta Edouard M.D.   On: 03/29/2020 17:43        Scheduled Meds: . Chlorhexidine Gluconate Cloth  6 each Topical Daily  . enoxaparin (LOVENOX) injection  40 mg Subcutaneous Q24H  . potassium & sodium phosphates  2 packet Oral Q4H  . sodium chloride flush  5 mL Intracatheter Q8H   Continuous Infusions: . famotidine (PEPCID) IV Stopped (03/30/20 1749)  . [START ON 04/01/2020] vancomycin       LOS: 2 days    Time spent: 25 minutes    Sidney Ace, MD [facility] Pager [phone]  If 7PM-7AM, please contact night-coverage 03/31/2020, 11:40 AM

## 2020-03-31 NOTE — Progress Notes (Signed)
PHARMACIST - PHYSICIAN COMMUNICATION  CONCERNING:  Enoxaparin (Lovenox) for DVT Prophylaxis    RECOMMENDATION: Patient was prescribed enoxaparin 30 mg q24 hours for VTE prophylaxis.   Filed Weights   03/29/20 1045 03/29/20 1636 03/31/20 0500  Weight: 67.1 kg (147 lb 14.9 oz) 67.1 kg (147 lb 14.9 oz) 65.8 kg (145 lb 1 oz)    Body mass index is 26.53 kg/m.  Estimated Creatinine Clearance: 34.7 mL/min (by C-G formula based on SCr of 0.92 mg/dL).   Based on [place] patient is candidate for enoxaparin 40 mg every 24 hours based on CrCl > 87ml/min  DESCRIPTION: Pharmacy has adjusted enoxaparin dose per [facility]/[place] policy.  Patient is now receiving enoxaparin 40 mg every 24 hours.  Tawnya Crook, PharmD Clinical Pharmacist  03/31/2020 10:32 AM

## 2020-04-01 LAB — CULTURE, BLOOD (ROUTINE X 2): Special Requests: ADEQUATE

## 2020-04-01 LAB — BASIC METABOLIC PANEL
Anion gap: 3 — ABNORMAL LOW (ref 5–15)
BUN: 26 mg/dL — ABNORMAL HIGH (ref 8–23)
CO2: 27 mmol/L (ref 22–32)
Calcium: 7.8 mg/dL — ABNORMAL LOW (ref 8.9–10.3)
Chloride: 110 mmol/L (ref 98–111)
Creatinine, Ser: 0.75 mg/dL (ref 0.44–1.00)
GFR calc Af Amer: >60 mL/min (ref >60)
GFR calc non Af Amer: >60 mL/min (ref >60)
Glucose, Bld: 106 mg/dL — ABNORMAL HIGH (ref 70–99)
Potassium: 3.4 mmol/L — ABNORMAL LOW (ref 3.5–5.1)
Sodium: 140 mmol/L (ref 135–145)

## 2020-04-01 LAB — PHOSPHORUS: Phosphorus: 3.3 mg/dL (ref 2.5–4.6)

## 2020-04-01 MED ORDER — MUPIROCIN 2 % EX OINT
1.0000 "application " | TOPICAL_OINTMENT | Freq: Two times a day (BID) | CUTANEOUS | Status: DC
  Administered 2020-04-01 – 2020-04-03 (×4): 1 via NASAL
  Filled 2020-04-01: qty 22

## 2020-04-01 MED ORDER — POTASSIUM CHLORIDE CRYS ER 20 MEQ PO TBCR
20.0000 meq | EXTENDED_RELEASE_TABLET | Freq: Once | ORAL | Status: AC
  Administered 2020-04-01: 20 meq via ORAL
  Filled 2020-04-01: qty 1

## 2020-04-01 MED ORDER — CHLORHEXIDINE GLUCONATE CLOTH 2 % EX PADS
6.0000 | MEDICATED_PAD | Freq: Every day | CUTANEOUS | Status: DC
  Administered 2020-04-01 – 2020-04-03 (×3): 6 via TOPICAL

## 2020-04-01 MED ORDER — HYDRALAZINE HCL 20 MG/ML IJ SOLN
10.0000 mg | INTRAMUSCULAR | Status: DC | PRN
  Administered 2020-04-03: 10 mg via INTRAVENOUS
  Filled 2020-04-01: qty 1

## 2020-04-01 NOTE — Consult Note (Signed)
[facility] Consultation Note  Patient ID: Brittney Wood, MRN: [id number], DOB/AGE: 1925-01-31 86 y.o. Admit date: 03/29/2020   Date of Consult: 04/01/2020 Primary Physician: Patient, No Pcp Per Primary Cardiologist: Nehemiah Massed  Chief Complaint:  Chief Complaint  Patient presents with  . Altered Mental Status   Reason for Consult: MRSA bacteremia  HPI: 86 y.o. female with known chronic diastolic dysfunction congestive heart failure COPD hypertension who has had recent significant hydronephrosis for which she is had a tube placed and has had new onset of MRSA bacteremia.  There is concerns that the MRSA bacteremia is from a different source and there should be further evaluation.  Echocardiogram has shown normal LV systolic function with ejection fraction of 60% with no evidence of significant valvular heart disease or apparent vegetation.  EKG shows normal sinus rhythm left atrial enlargement left bundle branch block.  Due to the concerns of possible endocarditis the patient may need a transesophageal echocardiogram.  We have discussed at length transesophageal echocardiogram with the patient although will need to talk to the family as well about all risks and benefits  Past Medical History:  Diagnosis Date  . Cancer ([place])    skin  . CHF (congestive heart failure) ([place])   . COPD (chronic obstructive pulmonary disease) ([place])   . Hypertension       Surgical History:  Past Surgical History:  Procedure Laterality Date  . ABDOMINAL HYSTERECTOMY    . IR NEPHROSTOMY PLACEMENT RIGHT  03/29/2020  . JOINT REPLACEMENT       Home Meds: Prior to Admission medications   Medication Sig Start Date End Date Taking? Authorizing Provider  aspirin 81 MG EC tablet Take by mouth.   Yes [provider]  donepezil (ARICEPT) 10 MG tablet Take 10 mg by mouth at bedtime.   Yes [provider]  latanoprost (XALATAN) 0.005 % ophthalmic solution 1 drop at bedtime. 03/13/20  Yes  [provider]  losartan (COZAAR) 100 MG tablet Take 100 mg by mouth daily.   Yes [provider]  memantine (NAMENDA) 10 MG tablet Take 10 mg by mouth 2 (two) times daily.   Yes [provider]  montelukast (SINGULAIR) 10 MG tablet Take 10 mg by mouth every morning.    Yes [provider]  omeprazole (PRILOSEC) 20 MG capsule Take 20 mg by mouth daily.   Yes [provider]  acetaminophen (TYLENOL) 650 MG CR tablet Take 650 mg by mouth 3 (three) times daily.    [provider]  amLODipine (NORVASC) 5 MG tablet Take 5 mg by mouth daily. Patient not taking: Reported on 03/29/2020    [provider]    Inpatient Medications:  . acetaminophen  650 mg Oral TID  . aspirin EC  81 mg Oral Daily  . Chlorhexidine Gluconate Cloth  6 each Topical Q0600  . donepezil  10 mg Oral QHS  . enoxaparin (LOVENOX) injection  40 mg Subcutaneous Q24H  . latanoprost  1 drop Both Eyes QHS  . losartan  50 mg Oral Daily  . memantine  10 mg Oral BID  . montelukast  10 mg Oral BH-q7a  . mupirocin ointment  1 application Nasal BID  . sodium chloride flush  5 mL Intracatheter Q8H   . sodium chloride 10 mL/hr at 04/01/20 1600  . famotidine (PEPCID) IV 20 mg (04/01/20 1838)  . vancomycin Stopped (04/01/20 0406)    Allergies:  Allergies  Allergen Reactions  . Buprenorphine Hcl  Other reaction(s): Other (See Comments) Hallucinations  . Morphine And Related Other (See Comments)    Hallucinations    Social History   Socioeconomic History  . Marital status: Single    Spouse name: Not on file  . Number of children: Not on file  . Years of education: Not on file  . Highest education level: Not on file  Occupational History  . Not on file  Tobacco Use  . Smoking status: Never Smoker  . Smokeless tobacco: Never Used  Substance and Sexual Activity  . Alcohol use: No  . Drug use: No  . Sexual activity: Not on file  Other Topics Concern  .  Not on file  Social History Narrative  . Not on file   Social Determinants of Health   Financial Resource Strain:   . Difficulty of Paying Living Expenses:   Food Insecurity:   . Worried About Charity fundraiser in the Last Year:   . Arboriculturist in the Last Year:   Transportation Needs:   . Film/video editor (Medical):   Marland Kitchen Lack of Transportation (Non-Medical):   Physical Activity:   . Days of Exercise per Week:   . Minutes of Exercise per Session:   Stress:   . Feeling of Stress :   Social Connections:   . Frequency of Communication with Friends and Family:   . Frequency of Social Gatherings with Friends and Family:   . Attends Religious Services:   . Active Member of Clubs or Organizations:   . Attends Archivist Meetings:   Marland Kitchen Marital Status:   Intimate Partner Violence:   . Fear of Current or Ex-Partner:   . Emotionally Abused:   Marland Kitchen Physically Abused:   . Sexually Abused:      Family History  Problem Relation Age of Onset  . Hypertension Other      Review of Systems Positive for none Negative for: General:  chills, fever, night sweats or weight changes.  Cardiovascular: PND orthopnea syncope dizziness  Dermatological skin lesions rashes Respiratory: Cough congestion Urologic: Frequent urination urination at night and hematuria Abdominal: negative for nausea, vomiting, diarrhea, bright red blood per rectum, melena, or hematemesis Neurologic: negative for visual changes, and/or hearing changes  All other systems reviewed and are otherwise negative except as noted above.  Labs: No results for input(s): CKTOTAL, CKMB, TROPONINI in the last 72 hours. Lab Results  Component Value Date   WBC 7.3 03/30/2020   HGB 12.8 03/30/2020   HCT 36.5 03/30/2020   MCV 94.1 03/30/2020   PLT 69 (L) 03/30/2020    Recent Labs  Lab 03/29/20 1104 03/30/20 0521 04/01/20 0507  NA 139   < > 140  K 3.6   < > 3.4*  CL 110   < > 110  CO2 21*   < > 27  BUN 27*    < > 26*  CREATININE 1.40*   < > 0.75  CALCIUM 8.1*   < > 7.8*  PROT 6.3*  --   --   BILITOT 1.0  --   --   ALKPHOS 72  --   --   ALT 22  --   --   AST 23  --   --   GLUCOSE 120*   < > 106*   < > = values in this interval not displayed.   No results found for: CHOL, HDL, LDLCALC, TRIG No results found for: DDIMER  Radiology/Studies:  CT ABDOMEN  PELVIS WO CONTRAST  Result Date: 03/29/2020 CLINICAL DATA:  Sepsis. EXAM: CT ABDOMEN AND PELVIS WITHOUT CONTRAST TECHNIQUE: Multidetector CT imaging of the abdomen and pelvis was performed following the standard protocol without IV contrast. COMPARISON:  None. FINDINGS: Lower chest: No acute abnormality. Bibasilar atelectasis/scarring. Emphysema in the medial right middle lobe. Hepatobiliary: Nodular liver contour. No focal liver abnormality is seen. Layering high-density sludge or small stones in the gallbladder neck. No gallbladder wall thickening or biliary dilatation. Pancreas: Unremarkable. No pancreatic ductal dilatation or surrounding inflammatory changes. Spleen: Moderate splenomegaly. Adrenals/Urinary Tract: 1.5 cm left adrenal adenoma. Right adrenal gland is unremarkable. 8 mm calculus at the right UPJ with resultant mild to moderate right hydronephrosis. Additional nonobstructive right renal calculi. 3.4 cm simple cyst in the upper pole of the right kidney. Subcentimeter low-density lesion in the midpole of the left kidney is too small to characterize. No left hydronephrosis. Multiple bladder diverticula. Stomach/Bowel: Small hiatal hernia. The stomach is otherwise within normal limits. No bowel wall thickening, distention, or surrounding inflammatory changes. There are a few colonic diverticula. Diminutive or absent appendix. Vascular/Lymphatic: Aortic atherosclerosis. No enlarged abdominal or pelvic lymph nodes. Reproductive: Status post hysterectomy. No adnexal masses. Other: Small fat containing umbilical hernia. No free fluid or  pneumoperitoneum. Musculoskeletal: No acute or significant osseous findings. Prior right total hip arthroplasty. IMPRESSION: 1. 8 mm calculus at the right UPJ with resultant mild to moderate right hydronephrosis. 2. Additional nonobstructive right nephrolithiasis. 3. Cirrhosis with sequelae of portal hypertension including moderate splenomegaly. 4. Cholelithiasis versus sludge. 5. 1.5 cm left adrenal adenoma. 6. Aortic Atherosclerosis (ICD10-I70.0) and Emphysema (ICD10-J43.9). Electronically Signed   By: Titus Dubin M.D.   On: 03/29/2020 14:43   DG Chest Port 1 View  Result Date: 03/30/2020 CLINICAL DATA:  86 year old female with respiratory distress EXAM: PORTABLE CHEST 1 VIEW COMPARISON:  03/30/2019 FINDINGS: Cardiomediastinal silhouette unchanged in size and contour. Mixed reticulonodular opacities of the bilateral lungs are unchanged from prior. No pneumothorax. No pleural effusion. No new confluent airspace disease. IMPRESSION: Unchanged appearance of the chest x-ray, with mild bilateral mixed reticulonodular opacities. Electronically Signed   By: Corrie Mckusick D.O.   On: 03/30/2020 09:14   DG Chest Port 1 View  Result Date: 03/29/2020 CLINICAL DATA:  Respiratory distress EXAM: PORTABLE CHEST 1 VIEW COMPARISON:  Earlier same day FINDINGS: Stable chronic interstitial prominence with potential superimposed mild edema. Patchy bibasilar atelectasis. No pleural effusion or pneumothorax. Stable cardiomediastinal contours. IMPRESSION: Stable chronic interstitial prominence with potential superimposed mild edema. Patchy bibasilar atelectasis. Electronically Signed   By: Macy Mis M.D.   On: 03/29/2020 21:00   DG Chest Port 1 View  Result Date: 03/29/2020 CLINICAL DATA:  86 year old female with history of sepsis. EXAM: PORTABLE CHEST 1 VIEW COMPARISON:  Chest x-ray 07/06/2015. FINDINGS: Lung volumes are low. No consolidative airspace disease. No pleural effusions. No pneumothorax. No pulmonary  nodule or mass noted. Pulmonary vasculature and the cardiomediastinal silhouette are within normal limits. Atherosclerotic calcifications in the thoracic aorta. Advanced degenerative changes of osteoarthritis are noted in shoulder joints bilaterally. IMPRESSION: 1. Low lung volumes without radiographic evidence of acute cardiopulmonary disease. 2. Aortic atherosclerosis. Electronically Signed   By: Vinnie Langton M.D.   On: 03/29/2020 11:36   ECHOCARDIOGRAM COMPLETE  Result Date: 03/31/2020    ECHOCARDIOGRAM REPORT   Patient Name:   [facility] Date of Exam: 03/31/2020 Medical Rec #:  [id number]    Height:       62.0 in Accession #:  [id number]   Weight:       145.1 lb Date of Birth:  21-Apr-1925    BSA:          1.668 m Patient Age:    59 years     BP:           146/67 mmHg Patient Gender: F            HR:           68 bpm. Exam Location:  [facility] Procedure: 2D Echo, Cardiac Doppler and Color Doppler Indications:     Bacteremia 790.7  History:         Patient has no prior history of Echocardiogram examinations.                  CHF, COPD; Risk Factors:Hypertension.  Sonographer:     Sherrie Sport RDCS (AE) Referring Phys:  [id number] Tsosie Billing Diagnosing Phys: Isaias Cowman MD  Sonographer Comments: Suboptimal apical window and no subcostal window. IMPRESSIONS  1. Left ventricular ejection fraction, by estimation, is 60 to 65%. The left ventricle has normal function. The left ventricle has no regional wall motion abnormalities. Left ventricular diastolic parameters are consistent with Grade I diastolic dysfunction (impaired relaxation).  2. Right ventricular systolic function is normal. The right ventricular size is normal. There is moderately elevated pulmonary artery systolic pressure.  3. The mitral valve is normal in structure. Mild mitral valve regurgitation. No evidence of mitral stenosis.  4. The aortic valve is normal in structure. Aortic valve regurgitation is not visualized. No aortic  stenosis is present.  5. The inferior vena cava is normal in size with greater than 50% respiratory variability, suggesting right atrial pressure of 3 mmHg. FINDINGS  Left Ventricle: Left ventricular ejection fraction, by estimation, is 60 to 65%. The left ventricle has normal function. The left ventricle has no regional wall motion abnormalities. The left ventricular internal cavity size was normal in size. There is  no left ventricular hypertrophy. Left ventricular diastolic parameters are consistent with Grade I diastolic dysfunction (impaired relaxation). Right Ventricle: The right ventricular size is normal. No increase in right ventricular wall thickness. Right ventricular systolic function is normal. There is moderately elevated pulmonary artery systolic pressure. The tricuspid regurgitant velocity is 3.05 m/s, and with an assumed right atrial pressure of 10 mmHg, the estimated right ventricular systolic pressure is 48.1 mmHg. Left Atrium: Left atrial size was normal in size. Right Atrium: Right atrial size was normal in size. Pericardium: There is no evidence of pericardial effusion. Mitral Valve: The mitral valve is normal in structure. Normal mobility of the mitral valve leaflets. Mild mitral valve regurgitation. No evidence of mitral valve stenosis. Tricuspid Valve: The tricuspid valve is normal in structure. Tricuspid valve regurgitation is mild . No evidence of tricuspid stenosis. Aortic Valve: The aortic valve is normal in structure. Aortic valve regurgitation is not visualized. No aortic stenosis is present. Aortic valve mean gradient measures 6.4 mmHg. Aortic valve peak gradient measures 10.8 mmHg. Aortic valve area, by VTI measures 1.90 cm. Pulmonic Valve: The pulmonic valve was normal in structure. Pulmonic valve regurgitation is not visualized. No evidence of pulmonic stenosis. Aorta: The aortic root is normal in size and structure. Venous: The inferior vena cava is normal in size with greater  than 50% respiratory variability, suggesting right atrial pressure of 3 mmHg. IAS/Shunts: No atrial level shunt detected by color flow Doppler.  LEFT VENTRICLE PLAX 2D LVIDd:  3.03 cm  Diastology LVIDs:         2.05 cm  LV e' lateral:   3.92 cm/s LV PW:         0.99 cm  LV E/e' lateral: 22.8 LV IVS:        0.92 cm  LV e' medial:    3.59 cm/s LVOT diam:     2.00 cm  LV E/e' medial:  24.9 LV SV:         71 LV SV Index:   43 LVOT Area:     3.14 cm  RIGHT VENTRICLE RV Basal diam:  2.96 cm RV S prime:     17.30 cm/s TAPSE (M-mode): 3.8 cm LEFT ATRIUM             Index       RIGHT ATRIUM           Index LA diam:        3.40 cm 2.04 cm/m  RA Area:     18.40 cm LA Vol (A2C):   48.2 ml 28.90 ml/m RA Volume:   54.70 ml  32.80 ml/m LA Vol (A4C):   48.6 ml 29.14 ml/m LA Biplane Vol: 52.0 ml 31.18 ml/m  AORTIC VALVE                    PULMONIC VALVE AV Area (Vmax):    1.83 cm     PV Vmax:        0.59 m/s AV Area (Vmean):   1.64 cm     PV Peak grad:   1.4 mmHg AV Area (VTI):     1.90 cm     RVOT Peak grad: 3 mmHg AV Vmax:           164.00 cm/s AV Vmean:          118.980 cm/s AV VTI:            0.374 m AV Peak Grad:      10.8 mmHg AV Mean Grad:      6.4 mmHg LVOT Vmax:         95.30 cm/s LVOT Vmean:        62.300 cm/s LVOT VTI:          0.226 m LVOT/AV VTI ratio: 0.60  AORTA Ao Root diam: 2.40 cm MITRAL VALVE                TRICUSPID VALVE MV Area (PHT): 3.93 cm     TR Peak grad:   37.2 mmHg MV Decel Time: 193 msec     TR Vmax:        305.00 cm/s MV E velocity: 89.50 cm/s MV A velocity: 127.00 cm/s  SHUNTS MV E/A ratio:  0.70         Systemic VTI:  0.23 m                             Systemic Diam: 2.00 cm Isaias Cowman MD Electronically signed by Isaias Cowman MD Signature Date/Time: 03/31/2020/10:28:49 AM    Final    IR NEPHROSTOMY PLACEMENT RIGHT  Result Date: 03/29/2020 CLINICAL DATA:  Sepsis and right renal obstruction with hydronephrosis secondary to an obstructing UPJ calculus. The patient  requires a right percutaneous nephrostomy tube for emergent decompression of the right kidney. EXAM: 1. ULTRASOUND GUIDANCE FOR PUNCTURE OF THE RIGHT RENAL COLLECTING SYSTEM. 2. LEFT PERCUTANEOUS NEPHROSTOMY TUBE PLACEMENT. COMPARISON:  None. ANESTHESIA/SEDATION: 0.5 mg IV Versed Total Moderate Sedation Time 10 minutes. The patient's level of consciousness and physiologic status were continuously monitored during the procedure by Radiology nursing. CONTRAST:  10 mL Visipaque 320 ml Omnipaque 300 MEDICATIONS: None FLUOROSCOPY TIME:  24 seconds.  3.3 mGy. PROCEDURE: The procedure, risks, benefits, and alternatives were explained to the patient. Questions regarding the procedure were encouraged and answered. The patient understands and consents to the procedure. A time-out was performed prior to initiating the procedure. The right flank region was prepped with chlorhexidine in a sterile fashion, and a sterile drape was applied covering the operative field. A sterile gown and sterile gloves were used for the procedure. Local anesthesia was provided with 1% Lidocaine. Ultrasound was used to localize the right kidney. Under direct ultrasound guidance, an 18 gauge trocar needle was advanced into the right renal collecting system. Ultrasound image documentation was performed. Aspiration of urine sample was performed followed by contrast injection. Percutaneous tract dilatation was then performed over the guidewire. A 10-French percutaneous nephrostomy tube was then advanced and formed in the collecting system. Catheter position was confirmed by fluoroscopy after contrast injection. The catheter was secured at the skin with a Prolene retention suture and Stat-Lock device. A gravity bag was placed. COMPLICATIONS: None. FINDINGS: There was return of grossly purulent urine from the right renal collecting system. A sample was sent for culture analysis. After placement of the 10 French nephrostomy tube the catheter was formed at  the level of the renal pelvis. There is good return of purulent and blood tinged urine. IMPRESSION: Right percutaneous nephrostomy tube placement. There was return of grossly purulent urine. A sample was sent for culture analysis. A 10 French nephrostomy tube was placed and attached to gravity bag drainage. Electronically Signed   By: Aletta Edouard M.D.   On: 03/29/2020 17:43    EKG: Normal sinus rhythm with left atrial enlargement left bundle branch block  Weights: Filed Weights   03/29/20 1045 03/29/20 1636 03/31/20 0500  Weight: 67.1 kg 67.1 kg 65.8 kg     Physical Exam: Blood pressure (!) 162/90, pulse 79, temperature 98.5 F (36.9 C), temperature source Oral, resp. rate 16, height 5\' 2"  (1.575 m), weight 65.8 kg, SpO2 97 %. Body mass index is 26.53 kg/m. General: Well developed, well nourished, in no acute distress. Head eyes ears nose throat: Normocephalic, atraumatic, sclera non-icteric, no xanthomas, nares are without discharge. No apparent thyromegaly and/or mass  Lungs: Normal respiratory effort.  no wheezes, no rales, no rhonchi.  Heart: RRR with normal S1 S2.  2+ left sternal murmur gallop, no rub, PMI is normal size and placement, carotid upstroke normal without bruit, jugular venous pressure is normal Abdomen: Soft, non-tender, non-distended with normoactive bowel sounds. No hepatomegaly. No rebound/guarding. No obvious abdominal masses. Abdominal aorta is normal size without bruit Extremities: No edema. no cyanosis, no clubbing, no ulcers  Peripheral : 2+ bilateral upper extremity pulses, 2+ bilateral femoral pulses, 2+ bilateral dorsal pedal pulse Neuro: Alert and oriented. No facial asymmetry. No focal deficit. Moves all extremities spontaneously. Musculoskeletal: Normal muscle tone without kyphosis Psych:  Responds to questions appropriately with a normal affect.    Assessment: 86 year old female with hypertension and diastolic dysfunction having new onset of  hydronephrosis but MRSA bacteremia with concerns of primary source possibly being endocarditis.  Plan: 1.  Continue therapy for MRSA bacteremia and other supportive care 2.  Further discussion with family and the patient for possible transesophageal echocardiogram for further evaluation  of possible source of bacteremia including endocarditis.  Patient understands the risk and benefits of transesophageal echocardiogram this includes the possibility of esophageal perforation sore throat side effects of medication.  She is at low risk for conscious sedation  Signed, Corey Skains M.D. [facility] 04/01/2020, 7:38 PM

## 2020-04-01 NOTE — Progress Notes (Signed)
ID Pt is feeling better than before  Patient Vitals for the past 24 hrs:  BP Temp Temp src Pulse Resp SpO2  04/01/20 0611 (!) 179/75 97.7 F (36.5 C) Oral 72 20 99 %  03/31/20 2046 (!) 168/88 98.4 F (36.9 C) Oral 79 18 95 %  03/31/20 1507 (!) 153/72 98.4 F (36.9 C) Oral 82 18 96 %  03/31/20 1200 (!) 139/59 -- -- 73 (!) 23 94 %   O/e awake, alert, intermittent confusion No distress Chest b/l air entry decreased bases Rt nephrostomy tube  CBC Latest Ref Rng & Units 03/30/2020 03/29/2020 07/06/2015  WBC 4.0 - 10.5 K/uL 7.3 15.3(H) 6.5  Hemoglobin 12.0 - 15.0 g/dL 12.8 13.6 10.8(L)  Hematocrit 36 - 46 % 36.5 40.3 34.3(L)  Platelets 150 - 400 K/uL 69(L) 77(L) 154     CMP Latest Ref Rng & Units 04/01/2020 03/31/2020 03/30/2020  Glucose 70 - 99 mg/dL 106(H) 128(H) 126(H)  BUN 8 - 23 mg/dL 26(H) 25(H) 25(H)  Creatinine 0.44 - 1.00 mg/dL 0.75 0.92 1.13(H)  Sodium 135 - 145 mmol/L 140 140 141  Potassium 3.5 - 5.1 mmol/L 3.4(L) 3.5 3.3(L)  Chloride 98 - 111 mmol/L 110 109 108  CO2 22 - 32 mmol/L 27 26 23   Calcium 8.9 - 10.3 mg/dL 7.8(L) 7.9(L) 8.2(L)  Total Protein 6.5 - 8.1 g/dL - - -  Total Bilirubin 0.3 - 1.2 mg/dL - - -  Alkaline Phos 38 - 126 U/L - - -  AST 15 - 41 U/L - - -  ALT 0 - 44 U/L - - -     Micro 7/16 BC- MRSA 7/16 Urine MRSA  7/16 nephrostomy- staph 7/19  Impression/Recommendation ? MRSA bacteremia Rt PUJ obstruction with stone and rt hydronephrosi- s/p nephrostomy Urine culture MRSA Nephrostomy urine also Staph aureus Pt currently on vanco She will need TEE   B/L TKA   copd  Discussed the management with the daughter and hospitalist

## 2020-04-01 NOTE — Progress Notes (Signed)
[place] for Electrolyte Monitoring and Replacement   Recent Labs: Potassium (mmol/L)  Date Value  04/01/2020 3.4 (L)  01/03/2015 3.9   Magnesium (mg/dL)  Date Value  03/31/2020 1.9   Calcium (mg/dL)  Date Value  04/01/2020 7.8 (L)   Calcium, Total (mg/dL)  Date Value  01/03/2015 8.2 (L)   Albumin (g/dL)  Date Value  03/29/2020 3.1 (L)  01/18/2014 2.9 (L)   Phosphorus (mg/dL)  Date Value  04/01/2020 3.3   Sodium (mmol/L)  Date Value  04/01/2020 140  01/03/2015 134 (L)   Corrected Ca: 8.52 mg/dL  Assessment: 86 year old female with MRSA bacteremia. Patient with 8 mm right UPJ stone with upstream hydronephrosis and additional 9 mm right lower pole stone burden.   Goal of Therapy:  Electrolytes WNL  Plan:   Oral KCl 20 mEq x 1  Follow up electrolytes with morning labs 7/20  Dallie Piles ,PharmD Clinical Pharmacist 04/01/2020 7:21 AM

## 2020-04-01 NOTE — Evaluation (Signed)
Occupational Therapy Evaluation Patient Details Name: Brittney Wood MRN: [id number] DOB: [birth year] Today's Date: 04/01/2020    History of Present Illness 86 y.o. female who presented to the [facility] via EMS after her caregiver found her with AMS this morning.  CT abd/pel today revealed an obstructing 8 mm right UPJ stone associated with right sided hydronephrosis. PMH includes: dementia   Clinical Impression   Pt presents semi-reclined in bed, on 2L O2, alert and agreeable to evaluation. Pt has history of dementia at baseline and no family or caregiver present to confirm information. However, pt oriented to self, birthday, current month and place. Disoriented only to year. Noticed that pt repeated information more than once during session. Follows commands very well. Per pt, she lives at [facility] where she receives daily assistance with dressing and bathing. She reports that she uses a RW at baseline and denies fall history. During session, she required MOD A for trunk elevation to sit up to EOB, c/o shoulder pain due to arthritis. Pt washed her face and brushed her dentures with SETUP A. Became fatigued after 10 min, with noted increasing posterior lean. Required MIN A to return to supine and MAX A to boost up in bed. Vitals monitored throughout, WNL. Overall, pt demonstrates decreased endurance, activity tolerance and generalized weakness impacting ADL participation. She will benefit from skilled acute OT services to address these limitations to maximize her independence and safety. Recommend SNF upon discharge.      Follow Up Recommendations  SNF    Equipment Recommendations  Other (comment) (TBD at next venue)    Recommendations for Other Services       Precautions / Restrictions Precautions Precautions: Fall Restrictions Weight Bearing Restrictions: No      Mobility Bed Mobility Overal bed mobility: Needs Assistance Bed Mobility: Supine to Sit;Sit to Supine     Supine  to sit: Mod assist;HOB elevated Sit to supine: Min assist;HOB elevated   General bed mobility comments: Pt says "I can't" because her shoulders hurt. MOD A required for trunk elevation. MAX A for boosting up in bed. Pt generally fatigues easily.  Transfers Overall transfer level: Needs assistance Equipment used: Rolling walker (2 wheeled) Transfers: Sit to/from Stand Sit to Stand: Min assist         General transfer comment: deferred this date due to pt fatigue and back pain    Balance Overall balance assessment: Needs assistance Sitting-balance support: Feet supported Sitting balance-Leahy Scale: Fair Sitting balance - Comments: able to maintain steady static sitting balance, increasing posterior lean with fatigue   Standing balance support: Bilateral upper extremity supported;During functional activity Standing balance-Leahy Scale: Good Standing balance comment: Reliant on RW, but steady with standing                           ADL either performed or assessed with clinical judgement   ADL Overall ADL's : Needs assistance/impaired                                       General ADL Comments: Pt unable to reach her socks at bed level. Washed her face with SETUP A at EOB. Brushed her dentures using a toothbrush with SETUP A. Became fatigued after sitting EOB x10 min, c/o shoulder pain with movement. Vitals monitored and WNL throughout. On 2L O2.     Vision Baseline Vision/History:  Wears glasses Wears Glasses: At all times       Perception     Praxis      Pertinent Vitals/Pain Pain Assessment: Faces Faces Pain Scale: Hurts little more Pain Location: B shoulders, back (due to OA) Pain Descriptors / Indicators: Discomfort;Grimacing Pain Intervention(s): Monitored during session;Repositioned     Hand Dominance Right   Extremity/Trunk Assessment Upper Extremity Assessment Upper Extremity Assessment: Generalized weakness (b/l shoulder pain)    Lower Extremity Assessment Lower Extremity Assessment: Generalized weakness (not formally assessed)   Cervical / Trunk Assessment Cervical / Trunk Assessment: Normal   Communication Communication Communication: No difficulties   Cognition Arousal/Alertness: Awake/alert Behavior During Therapy: WFL for tasks assessed/performed Overall Cognitive Status: History of cognitive impairments - at baseline                                 General Comments: Pt pleasant, alert and oriented to self, birthday, place and month. Disoriented to year. Able to express needs and followed multistep commands. Three instances of pt repeating information during session.   General Comments  Vitals monitored and remained WNL throughout. No edema noted. Does have a nephrostomy bag.    Exercises Other Exercises Other Exercises: Engaged pt in multistep ADL to assess cognition and seated EOB grooming tasks for increased balance/sitting tolerance Other Exercises: Educated pt re: the role of OT and safety during self-care   Shoulder Instructions      Home Living Family/patient expects to be discharged to:: Skilled nursing facility Living Arrangements: Alone                               Additional Comments: lives in independent living in [place] at [place]: Needs assistance  Gait / Transfers Assistance Needed: per pt report, uses a RW for mobility, denies any falls ADL's / Homemaking Assistance Needed: per pt report, caregiver at [employer] assists her daily with dressing and showering Communication / Swallowing Assistance Needed: HOH Comments: no caregiver or family present to confirm information, no inconsistencies noted in chart review        OT Problem List: Decreased strength;Decreased activity tolerance;Impaired balance (sitting and/or standing);Decreased knowledge of use of DME or  AE;Impaired UE functional use;Impaired tone;Pain;Decreased cognition      OT Treatment/Interventions: Self-care/ADL training;Therapeutic exercise;Therapeutic activities;Cognitive remediation/compensation;Energy conservation;DME and/or AE instruction;Patient/family education;Balance training    OT Goals(Current goals can be found in the care plan section) Acute Rehab OT Goals Patient Stated Goal: "I want to go home" OT Goal Formulation: With patient Time For Goal Achievement: 04/15/20 Potential to Achieve Goals: Fair ADL Goals Pt Will Perform Grooming: with min guard assist;standing (using LRAD) Pt Will Perform Upper Body Bathing: with supervision;with min assist (to wash back) Pt Will Transfer to Toilet: with min assist;ambulating;bedside commode (using LRAD)  OT Frequency: Min 1X/week   Barriers to D/C:            Co-evaluation              AM-PAC OT "6 Clicks" Daily Activity     Outcome Measure Help from another person eating meals?: A Little Help from another person taking care of personal grooming?: A Little Help from another person toileting, which includes using toliet, bedpan, or urinal?: A Lot Help from another person bathing (including washing, rinsing, drying)?: A  Lot Help from another person to put on and taking off regular upper body clothing?: A Little Help from another person to put on and taking off regular lower body clothing?: A Lot 6 Click Score: 15   End of Session    Activity Tolerance: Patient tolerated treatment well;Patient limited by fatigue Patient left: in bed;with call bell/phone within reach;with bed alarm set  OT Visit Diagnosis: Other abnormalities of gait and mobility (R26.89);Muscle weakness (generalized) (M62.81);Other symptoms and signs involving cognitive function;Pain Pain - Right/Left: Left (Right and Left) Pain - part of body: Shoulder                Time: 1595-3967 OT Time Calculation (min): 25 min Charges:  OT General  Charges $OT Visit: 1 Visit OT Evaluation $OT Eval Moderate Complexity: 1 Mod OT Treatments $Self Care/Home Management : 8-22 mins  Jerilynn Birkenhead, OTS 04/01/20, 5:09 PM

## 2020-04-01 NOTE — Progress Notes (Signed)
PROGRESS NOTE    Brittney Wood  FVC:[id number] DOB: 08/05/1925 DOA: 03/29/2020 PCP: Patient, No Pcp Per    Brief Narrative:  86 year old lady with past medical history of hypertension, COPD, CHF, GERD, who presents with septic shock secondary to infected right UPJ stone.  Pt is s/p of right nephrostomy tube placement by IR. Patient is stabilized. Current blood pressure 115/46. Patient did not require vasopressor. Pt is on broad antibiotics.  7/18: Patient seen and examined.  Hemodynamically stable.  No fevers noted over interval.  Daughter at bedside.  Stable for transfer to general medical floor.  Daughter and patient updated on plan of care.  7/19: Patient seen and examined.  Remains hemodynamically stable and afebrile.  No status changes over interval.  TTE negative.  2 daughters at bedside updated on plan of care  Assessment & Plan:   Active Problems:   Severe sepsis (HCC)   MRSA bacteremia   Nephrolithiasis   Dementia without behavioral disturbance ([place])  Infected kidney stone status post nephrostomy tube placement MRSA bacteremia Severe sepsis secondary to above Status post nephrostomy Urine culture with staph aureus Nephrostomy urine culture gram-positive cocci Was on vancomycin and Rocephin, Rocephin discontinued Unclear source of MRSA bacteremia.  Patient colonized in nares TTE negative for vegetation Plan: Continue IV antibiotics, currently on zyvox Continue gentle IV fluid hydration Monitor fever curve Follow cultures Urology following Infectious disease following Patient may warrant TEE, will await ID followup  COPD Currently not exacerbated and on room air As needed nebs Continue montelukast  Hypertension Continue Cozaar at half of home dose, 50 mg daily  Dementia Continue donepezil and memantine   DVT prophylaxis: Lovenox Code Status: DNR Family Communication: Daughter x 2 at bedside Disposition Plan: Status is: Inpatient  Remains inpatient  appropriate because:Inpatient level of care appropriate due to severity of illness   Dispo: The patient is from: Home              Anticipated d/c is to: Home              Anticipated d/c date is: 2 days              Patient currently is not medically stable to d/c.  Continuing IV antibiotics for MRSA bacteremia.  Source unclear.  Surveillance cultures ordered today.  Awaiting ID follow-up for further recommendations regarding work-up   Consultants:   ID  Urology  Procedures:   Right nephrostomy tube  Antimicrobials:   Vancomycin   Subjective: Patient seen and examined.  Somewhat confused but may be related to underlying dementia.  Daughters at bedside.  All questions answered.  Patient in no visible distress.  Objective: Vitals:   03/31/20 1200 03/31/20 1507 03/31/20 2046 04/01/20 0611  BP: (!) 139/59 (!) 153/72 (!) 168/88 (!) 179/75  Pulse: 73 82 79 72  Resp: (!) 23 18 18 20   Temp:  98.4 F (36.9 C) 98.4 F (36.9 C) 97.7 F (36.5 C)  TempSrc:  Oral Oral Oral  SpO2: 94% 96% 95% 99%  Weight:      Height:        Intake/Output Summary (Last 24 hours) at 04/01/2020 1137 Last data filed at 04/01/2020 1100 Gross per 24 hour  Intake 645.74 ml  Output 860 ml  Net -214.26 ml   Filed Weights   03/29/20 1045 03/29/20 1636 03/31/20 0500  Weight: 67.1 kg 67.1 kg 65.8 kg    Examination:  General exam: Appears calm and comfortable  Respiratory system: Clear to  auscultation. Respiratory effort normal. Cardiovascular system: S1 & S2 heard, RRR. No JVD, murmurs, rubs, gallops or clicks. No pedal edema. Gastrointestinal system: Nontender, nondistended, positive bowel sounds, right nephrostomy tube Central nervous system: Alert, oriented x2.  No focal deficits Extremities: Symmetric 5 x 5 power. Skin: No rashes, lesions or ulcers Psychiatry: Confused    Data Reviewed: I have personally reviewed following labs and imaging studies  CBC: Recent Labs  Lab  03/29/20 1104 03/30/20 0521  WBC 15.3* 7.3  NEUTROABS 13.9*  --   HGB 13.6 12.8  HCT 40.3 36.5  MCV 97.1 94.1  PLT 77* 69*   Basic Metabolic Panel: Recent Labs  Lab 03/29/20 1104 03/30/20 0521 03/31/20 0329 04/01/20 0507  NA 139 141 140 140  K 3.6 3.3* 3.5 3.4*  CL 110 108 109 110  CO2 21* 23 26 27   GLUCOSE 120* 126* 128* 106*  BUN 27* 25* 25* 26*  CREATININE 1.40* 1.13* 0.92 0.75  CALCIUM 8.1* 8.2* 7.9* 7.8*  MG  --  1.8 1.9  --   PHOS  --  3.3 2.1* 3.3   GFR: Estimated Creatinine Clearance: 40 mL/min (by C-G formula based on SCr of 0.75 mg/dL). Liver Function Tests: Recent Labs  Lab 03/29/20 1104  AST 23  ALT 22  ALKPHOS 72  BILITOT 1.0  PROT 6.3*  ALBUMIN 3.1*   No results for input(s): LIPASE, AMYLASE in the last 168 hours. No results for input(s): AMMONIA in the last 168 hours. Coagulation Profile: Recent Labs  Lab 03/29/20 1104  INR 1.3*   Cardiac Enzymes: No results for input(s): CKTOTAL, CKMB, CKMBINDEX, TROPONINI in the last 168 hours. BNP (last 3 results) No results for input(s): PROBNP in the last 8760 hours. HbA1C: No results for input(s): HGBA1C in the last 72 hours. CBG: Recent Labs  Lab 03/29/20 1806  GLUCAP 117*   Lipid Profile: No results for input(s): CHOL, HDL, LDLCALC, TRIG, CHOLHDL, LDLDIRECT in the last 72 hours. Thyroid Function Tests: No results for input(s): TSH, T4TOTAL, FREET4, T3FREE, THYROIDAB in the last 72 hours. Anemia Panel: No results for input(s): VITAMINB12, FOLATE, FERRITIN, TIBC, IRON, RETICCTPCT in the last 72 hours. Sepsis Labs: Recent Labs  Lab 03/29/20 1104 03/29/20 1232  LATICACIDVEN 2.6* 2.9*    Recent Results (from the past 240 hour(s))  Blood Culture (routine x 2)     Status: Abnormal   Collection Time: 03/29/20 11:04 AM   Specimen: BLOOD  Result Value Ref Range Status   Specimen Description   Final    BLOOD BLOOD RIGHT ARM Performed at [facility], [address],  [place], [place] [address]    Special Requests   Final    BOTTLES DRAWN AEROBIC AND ANAEROBIC Blood Culture adequate volume Performed at [facility], [address] [place],  [address]    Culture  Setup Time   Final    GRAM POSITIVE COCCI IN BOTH AEROBIC AND ANAEROBIC BOTTLES CRITICAL RESULT CALLED TO, READ BACK BY AND VERIFIED WITH: JASON ROBBINS 03/29/20 AT 2135 BY AR    Culture METHICILLIN RESISTANT STAPHYLOCOCCUS AUREUS (A)  Final   Report Status 04/01/2020 FINAL  Final   Organism ID, Bacteria METHICILLIN RESISTANT STAPHYLOCOCCUS AUREUS  Final      Susceptibility   Methicillin resistant staphylococcus aureus - MIC*    CIPROFLOXACIN >=8 RESISTANT Resistant     ERYTHROMYCIN <=0.25 SENSITIVE Sensitive     GENTAMICIN <=0.5 SENSITIVE Sensitive     OXACILLIN >=4 RESISTANT Resistant  TETRACYCLINE <=1 SENSITIVE Sensitive     VANCOMYCIN <=0.5 SENSITIVE Sensitive     TRIMETH/SULFA <=10 SENSITIVE Sensitive     CLINDAMYCIN <=0.25 SENSITIVE Sensitive     RIFAMPIN <=0.5 SENSITIVE Sensitive     Inducible Clindamycin NEGATIVE Sensitive     * METHICILLIN RESISTANT STAPHYLOCOCCUS AUREUS  Blood Culture (routine x 2)     Status: Abnormal   Collection Time: 03/29/20 11:04 AM   Specimen: BLOOD  Result Value Ref Range Status   Specimen Description   Final    BLOOD BLOOD LEFT HAND Performed at [facility], [address], [place], [place] [address]    Special Requests   Final    BOTTLES DRAWN AEROBIC AND ANAEROBIC Blood Culture results may not be optimal due to an inadequate volume of blood received in culture bottles Performed at [facility], [address] [place], [place] [address]    Culture  Setup Time   Final    GRAM POSITIVE COCCI IN BOTH AEROBIC AND ANAEROBIC BOTTLES CRITICAL VALUE NOTED.  VALUE IS CONSISTENT WITH PREVIOUSLY REPORTED AND CALLED VALUE.    Culture (A)  Final    STAPHYLOCOCCUS AUREUS SUSCEPTIBILITIES PERFORMED ON PREVIOUS CULTURE  WITHIN THE LAST 5 DAYS. Performed at [facility], [place] [address], [place], [place] [address]    Report Status 04/01/2020 FINAL  Final  Urine culture     Status: Abnormal   Collection Time: 03/29/20 11:04 AM   Specimen: Urine, Random  Result Value Ref Range Status   Specimen Description   Final    URINE, RANDOM Performed at [facility], [address] [place], [place] [address]    Special Requests   Final    NONE Performed at [facility], [place], [place] [address]    Culture (A)  Final    >=100,000 COLONIES/mL METHICILLIN RESISTANT STAPHYLOCOCCUS AUREUS   Report Status 03/31/2020 FINAL  Final   Organism ID, Bacteria METHICILLIN RESISTANT STAPHYLOCOCCUS AUREUS (A)  Final      Susceptibility   Methicillin resistant staphylococcus aureus - MIC*    CIPROFLOXACIN >=8 RESISTANT Resistant     GENTAMICIN <=0.5 SENSITIVE Sensitive     NITROFURANTOIN <=16 SENSITIVE Sensitive     OXACILLIN >=4 RESISTANT Resistant     TETRACYCLINE <=1 SENSITIVE Sensitive     VANCOMYCIN <=0.5 SENSITIVE Sensitive     TRIMETH/SULFA <=10 SENSITIVE Sensitive     CLINDAMYCIN <=0.25 SENSITIVE Sensitive     RIFAMPIN <=0.5 SENSITIVE Sensitive     Inducible Clindamycin NEGATIVE Sensitive     * >=100,000 COLONIES/mL METHICILLIN RESISTANT STAPHYLOCOCCUS AUREUS  Blood Culture ID Panel (Reflexed)     Status: Abnormal   Collection Time: 03/29/20 11:04 AM  Result Value Ref Range Status   Enterococcus species NOT DETECTED NOT DETECTED Final   Listeria monocytogenes NOT DETECTED NOT DETECTED Final   Staphylococcus species DETECTED (A) NOT DETECTED Final    Comment: CRITICAL RESULT CALLED TO, READ BACK BY AND VERIFIED WITH: JASON ROBBINS 03/29/20 AT 2135 BY ACR    Staphylococcus aureus (BCID) DETECTED (A) NOT DETECTED Final    Comment: Methicillin (oxacillin)-resistant Staphylococcus aureus (MRSA). MRSA is predictably resistant to beta-lactam antibiotics (except ceftaroline).  Preferred therapy is vancomycin unless clinically contraindicated. Patient requires contact precautions if  hospitalized. CRITICAL RESULT CALLED TO, READ BACK BY AND VERIFIED WITH: JASON ROBBINS 03/29/20 AT 2135 BY ACR    Methicillin resistance DETECTED (A) NOT DETECTED Final    Comment: CRITICAL RESULT CALLED  TO, READ BACK BY AND VERIFIED WITH: JASON ROBBINS 03/29/20 AT 2135 BY ACR    Streptococcus species NOT DETECTED NOT DETECTED Final   Streptococcus agalactiae NOT DETECTED NOT DETECTED Final   Streptococcus pneumoniae NOT DETECTED NOT DETECTED Final   Streptococcus pyogenes NOT DETECTED NOT DETECTED Final   Acinetobacter baumannii NOT DETECTED NOT DETECTED Final   Enterobacteriaceae species NOT DETECTED NOT DETECTED Final   Enterobacter cloacae complex NOT DETECTED NOT DETECTED Final   Escherichia coli NOT DETECTED NOT DETECTED Final   Klebsiella oxytoca NOT DETECTED NOT DETECTED Final   Klebsiella pneumoniae NOT DETECTED NOT DETECTED Final   Proteus species NOT DETECTED NOT DETECTED Final   Serratia marcescens NOT DETECTED NOT DETECTED Final   Haemophilus influenzae NOT DETECTED NOT DETECTED Final   Neisseria meningitidis NOT DETECTED NOT DETECTED Final   Pseudomonas aeruginosa NOT DETECTED NOT DETECTED Final   Candida albicans NOT DETECTED NOT DETECTED Final   Candida glabrata NOT DETECTED NOT DETECTED Final   Candida krusei NOT DETECTED NOT DETECTED Final   Candida parapsilosis NOT DETECTED NOT DETECTED Final   Candida tropicalis NOT DETECTED NOT DETECTED Final    Comment: Performed at [facility], [address] [place], [place] [address]  SARS Coronavirus 2 by RT PCR (hospital order, performed in [place] hospital lab) Nasopharyngeal Nasopharyngeal Swab     Status: None   Collection Time: 03/29/20 12:32 PM   Specimen: Nasopharyngeal Swab  Result Value Ref Range Status   SARS Coronavirus 2 NEGATIVE NEGATIVE Final    Comment: (NOTE) SARS-CoV-2 target nucleic  acids are NOT DETECTED.  The SARS-CoV-2 RNA is generally detectable in upper and lower respiratory specimens during the acute phase of infection. The lowest concentration of SARS-CoV-2 viral copies this assay can detect is 250 copies / mL. A negative result does not preclude SARS-CoV-2 infection and should not be used as the sole basis for treatment or other patient management decisions.  A negative result may occur with improper specimen collection / handling, submission of specimen other than nasopharyngeal swab, presence of viral mutation(s) within the areas targeted by this assay, and inadequate number of viral copies (<250 copies / mL). A negative result must be combined with clinical observations, patient history, and epidemiological information.  Fact Sheet for Patients:   StrictlyIdeas.no  Fact Sheet for Healthcare Providers: BankingDealers.co.za  This test is not yet approved or  cleared by the [place] FDA and has been authorized for detection and/or diagnosis of SARS-CoV-2 by FDA under an Emergency Use Authorization (EUA).  This EUA will remain in effect (meaning this test can be used) for the duration of the COVID-19 declaration under Section 564(b)(1) of the Act, 21 U.S.C. section 360bbb-3(b)(1), unless the authorization is terminated or revoked sooner.  Performed at [facility], [address] [place], [place] [address]   Aerobic/Anaerobic Culture (surgical/deep wound)     Status: None (Preliminary result)   Collection Time: 03/29/20  6:25 PM   Specimen: Kidney; Urine  Result Value Ref Range Status   Specimen Description   Final    KIDNEY Performed at [facility], [address], [place], [place] [address]    Special Requests   Final    Normal Performed at [facility], [place], [place] [address]    Gram Stain   Final    ABUNDANT WBC PRESENT, PREDOMINANTLY  PMN ABUNDANT GRAM POSITIVE COCCI IN PAIRS IN CLUSTERS    Culture   Final  ABUNDANT STAPHYLOCOCCUS AUREUS CULTURE REINCUBATED FOR BETTER GROWTH Performed at [facility], [place] [address], [place], [place] [address]    Report Status PENDING  Incomplete  MRSA PCR Screening     Status: Abnormal   Collection Time: 03/29/20  6:25 PM   Specimen: Nasal Mucosa; Nasopharyngeal  Result Value Ref Range Status   MRSA by PCR POSITIVE (A) NEGATIVE Final    Comment:        The GeneXpert MRSA Assay (FDA approved for NASAL specimens only), is one component of a comprehensive MRSA colonization surveillance program. It is not intended to diagnose MRSA infection nor to guide or monitor treatment for MRSA infections. RESULT CALLED TO, READ BACK BY AND VERIFIED WITH: RASHELLE RICHARDS 03/29/20 AT 1955 BY SL Performed at [facility], [address] [place], [place] [address]          Radiology Studies: ECHOCARDIOGRAM COMPLETE  Result Date: 03/31/2020    ECHOCARDIOGRAM REPORT   Patient Name:   [facility] Date of Exam: 03/31/2020 Medical Rec #:  [id number]    Height:       62.0 in Accession #:    [id number]   Weight:       145.1 lb Date of Birth:  06-15-1925    BSA:          1.668 m Patient Age:    57 years     BP:           146/67 mmHg Patient Gender: F            HR:           68 bpm. Exam Location:  [facility] Procedure: 2D Echo, Cardiac Doppler and Color Doppler Indications:     Bacteremia 790.7  History:         Patient has no prior history of Echocardiogram examinations.                  CHF, COPD; Risk Factors:Hypertension.  Sonographer:     Sherrie Sport RDCS (AE) Referring Phys:  [id number] Tsosie Billing Diagnosing Phys: Isaias Cowman MD  Sonographer Comments: Suboptimal apical window and no subcostal window. IMPRESSIONS  1. Left ventricular ejection fraction, by estimation, is 60 to 65%. The left ventricle has normal function. The left ventricle has no regional wall motion  abnormalities. Left ventricular diastolic parameters are consistent with Grade I diastolic dysfunction (impaired relaxation).  2. Right ventricular systolic function is normal. The right ventricular size is normal. There is moderately elevated pulmonary artery systolic pressure.  3. The mitral valve is normal in structure. Mild mitral valve regurgitation. No evidence of mitral stenosis.  4. The aortic valve is normal in structure. Aortic valve regurgitation is not visualized. No aortic stenosis is present.  5. The inferior vena cava is normal in size with greater than 50% respiratory variability, suggesting right atrial pressure of 3 mmHg. FINDINGS  Left Ventricle: Left ventricular ejection fraction, by estimation, is 60 to 65%. The left ventricle has normal function. The left ventricle has no regional wall motion abnormalities. The left ventricular internal cavity size was normal in size. There is  no left ventricular hypertrophy. Left ventricular diastolic parameters are consistent with Grade I diastolic dysfunction (impaired relaxation). Right Ventricle: The right ventricular size is normal. No increase in right ventricular wall thickness. Right ventricular systolic function is normal. There is moderately elevated pulmonary artery systolic pressure. The tricuspid regurgitant velocity is 3.05 m/s, and with an assumed right atrial pressure of  10 mmHg, the estimated right ventricular systolic pressure is 35.3 mmHg. Left Atrium: Left atrial size was normal in size. Right Atrium: Right atrial size was normal in size. Pericardium: There is no evidence of pericardial effusion. Mitral Valve: The mitral valve is normal in structure. Normal mobility of the mitral valve leaflets. Mild mitral valve regurgitation. No evidence of mitral valve stenosis. Tricuspid Valve: The tricuspid valve is normal in structure. Tricuspid valve regurgitation is mild . No evidence of tricuspid stenosis. Aortic Valve: The aortic valve is normal  in structure. Aortic valve regurgitation is not visualized. No aortic stenosis is present. Aortic valve mean gradient measures 6.4 mmHg. Aortic valve peak gradient measures 10.8 mmHg. Aortic valve area, by VTI measures 1.90 cm. Pulmonic Valve: The pulmonic valve was normal in structure. Pulmonic valve regurgitation is not visualized. No evidence of pulmonic stenosis. Aorta: The aortic root is normal in size and structure. Venous: The inferior vena cava is normal in size with greater than 50% respiratory variability, suggesting right atrial pressure of 3 mmHg. IAS/Shunts: No atrial level shunt detected by color flow Doppler.  LEFT VENTRICLE PLAX 2D LVIDd:         3.03 cm  Diastology LVIDs:         2.05 cm  LV e' lateral:   3.92 cm/s LV PW:         0.99 cm  LV E/e' lateral: 22.8 LV IVS:        0.92 cm  LV e' medial:    3.59 cm/s LVOT diam:     2.00 cm  LV E/e' medial:  24.9 LV SV:         71 LV SV Index:   43 LVOT Area:     3.14 cm  RIGHT VENTRICLE RV Basal diam:  2.96 cm RV S prime:     17.30 cm/s TAPSE (M-mode): 3.8 cm LEFT ATRIUM             Index       RIGHT ATRIUM           Index LA diam:        3.40 cm 2.04 cm/m  RA Area:     18.40 cm LA Vol (A2C):   48.2 ml 28.90 ml/m RA Volume:   54.70 ml  32.80 ml/m LA Vol (A4C):   48.6 ml 29.14 ml/m LA Biplane Vol: 52.0 ml 31.18 ml/m  AORTIC VALVE                    PULMONIC VALVE AV Area (Vmax):    1.83 cm     PV Vmax:        0.59 m/s AV Area (Vmean):   1.64 cm     PV Peak grad:   1.4 mmHg AV Area (VTI):     1.90 cm     RVOT Peak grad: 3 mmHg AV Vmax:           164.00 cm/s AV Vmean:          118.980 cm/s AV VTI:            0.374 m AV Peak Grad:      10.8 mmHg AV Mean Grad:      6.4 mmHg LVOT Vmax:         95.30 cm/s LVOT Vmean:        62.300 cm/s LVOT VTI:          0.226 m LVOT/AV VTI ratio: 0.60  AORTA  Ao Root diam: 2.40 cm MITRAL VALVE                TRICUSPID VALVE MV Area (PHT): 3.93 cm     TR Peak grad:   37.2 mmHg MV Decel Time: 193 msec     TR Vmax:         305.00 cm/s MV E velocity: 89.50 cm/s MV A velocity: 127.00 cm/s  SHUNTS MV E/A ratio:  0.70         Systemic VTI:  0.23 m                             Systemic Diam: 2.00 cm Isaias Cowman MD Electronically signed by Isaias Cowman MD Signature Date/Time: 03/31/2020/10:28:49 AM    Final         Scheduled Meds: . acetaminophen  650 mg Oral TID  . aspirin EC  81 mg Oral Daily  . Chlorhexidine Gluconate Cloth  6 each Topical Q0600  . donepezil  10 mg Oral QHS  . enoxaparin (LOVENOX) injection  40 mg Subcutaneous Q24H  . latanoprost  1 drop Both Eyes QHS  . losartan  50 mg Oral Daily  . memantine  10 mg Oral BID  . montelukast  10 mg Oral [id number]  . mupirocin ointment  1 application Nasal BID  . sodium chloride flush  5 mL Intracatheter Q8H   Continuous Infusions: . sodium chloride 10 mL/hr at 04/01/20 1100  . famotidine (PEPCID) IV Stopped (03/31/20 1826)  . vancomycin Stopped (04/01/20 0406)     LOS: 3 days    Time spent: 25 minutes    Sidney Ace, MD [facility] Pager [phone]  If 7PM-7AM, please contact night-coverage 04/01/2020, 11:37 AM

## 2020-04-01 NOTE — Evaluation (Signed)
Physical Therapy Evaluation Patient Details Name: Brittney Wood MRN: [id number] DOB: 06-20-1925 Today's Date: 04/01/2020   History of Present Illness  87 y.o. female who presented to the [facility] via EMS after her caregiver found her with AMS this morning.  CT abd/pel today revealed an obstructing 8 mm right UPJ stone associated with right sided hydronephrosis. PMH includes: dementia  Clinical Impression  Patient received in bed, daughters present at bedside. Alert, oriented, forgetful. She reports arthritic pain in shoulders. She requires mod assist for supine to sit. Min assist for sit to stand from elevated surface. She was unable to walk due to active loose stool during session. Therefore session completed. She will continue to benefit from skilled PT while here to improve strength and functional independence for return to prior functional level.     Follow Up Recommendations SNF;Supervision for mobility/OOB    Equipment Recommendations  None recommended by PT    Recommendations for Other Services       Precautions / Restrictions Precautions Precautions: Fall Restrictions Weight Bearing Restrictions: No      Mobility  Bed Mobility Overal bed mobility: Needs Assistance Bed Mobility: Supine to Sit;Sit to Supine     Supine to sit: Min assist;HOB elevated Sit to supine: Min assist;HOB elevated   General bed mobility comments: patient will state " I can't" without really trying.  Transfers Overall transfer level: Needs assistance Equipment used: Rolling walker (2 wheeled) Transfers: Sit to/from Stand Sit to Stand: Min assist         General transfer comment: upon standing patient was found to be soiled and continued having loose stool upon standing. Therefore returned to bed.  Ambulation/Gait             General Gait Details: not assessed this day  Stairs            Wheelchair Mobility    Modified Rankin (Stroke Patients Only)       Balance  Overall balance assessment: Needs assistance Sitting-balance support: Feet supported Sitting balance-Leahy Scale: Good     Standing balance support: Bilateral upper extremity supported;During functional activity Standing balance-Leahy Scale: Good Standing balance comment: Reliant on RW, but steady with standing                             Pertinent Vitals/Pain Pain Assessment: Faces Faces Pain Scale: Hurts a little bit Pain Location: B shoulders, OA Pain Descriptors / Indicators: Discomfort Pain Intervention(s): Monitored during session    Home Living Family/patient expects to be discharged to:: Skilled nursing facility Living Arrangements: Alone               Additional Comments: lives in independent living in [place]    Prior Function Level of Independence: Independent with assistive device(s)               Hand Dominance        Extremity/Trunk Assessment   Upper Extremity Assessment Upper Extremity Assessment: Generalized weakness    Lower Extremity Assessment Lower Extremity Assessment: Generalized weakness    Cervical / Trunk Assessment Cervical / Trunk Assessment: Normal  Communication   Communication: No difficulties  Cognition Arousal/Alertness: Awake/alert Behavior During Therapy: WFL for tasks assessed/performed Overall Cognitive Status: Within Functional Limits for tasks assessed  General Comments      Exercises     Assessment/Plan    PT Assessment Patient needs continued PT services  PT Problem List Decreased strength;Decreased mobility;Decreased activity tolerance;Pain       PT Treatment Interventions DME instruction;Therapeutic activities;Gait training;Therapeutic exercise;Patient/family education;Balance training;Functional mobility training    PT Goals (Current goals can be found in the Care Plan section)  Acute Rehab PT Goals Patient Stated  Goal: none stated PT Goal Formulation: With patient Time For Goal Achievement: 04/08/20 Potential to Achieve Goals: Fair    Frequency Min 2X/week   Barriers to discharge Decreased caregiver support lives in  independent living    Co-evaluation               AM-PAC PT "6 Clicks" Mobility  Outcome Measure Help needed turning from your back to your side while in a flat bed without using bedrails?: A Little Help needed moving from lying on your back to sitting on the side of a flat bed without using bedrails?: A Little Help needed moving to and from a bed to a chair (including a wheelchair)?: A Little Help needed standing up from a chair using your arms (e.g., wheelchair or bedside chair)?: A Little Help needed to walk in hospital room?: A Lot Help needed climbing 3-5 steps with a railing? : A Lot 6 Click Score: 16    End of Session Equipment Utilized During Treatment: Gait belt;Oxygen Activity Tolerance: Patient tolerated treatment well Patient left: in bed;with nursing/sitter in room;with call bell/phone within reach Nurse Communication: Mobility status PT Visit Diagnosis: Other abnormalities of gait and mobility (R26.89);Muscle weakness (generalized) (M62.81)    Time: 8527-7824 PT Time Calculation (min) (ACUTE ONLY): 31 min   Charges:   PT Evaluation $PT Eval Moderate Complexity: 1 Mod PT Treatments $Therapeutic Activity: 8-22 mins        [employer], PT, [facility] 04/01/20,3:26 PM

## 2020-04-02 ENCOUNTER — Inpatient Hospital Stay
Admit: 2020-04-02 | Discharge: 2020-04-02 | Disposition: A | Discharge status: Home / Self Care | Payer: Medicare Other | Attending: Internal Medicine | Admitting: Internal Medicine

## 2020-04-02 ENCOUNTER — Encounter
Admission: EM | Disposition: A | Discharge status: Skilled Nursing Facility | Payer: Medicare Other | Source: Home / Self Care | Attending: Internal Medicine

## 2020-04-02 HISTORY — PX: TEE WITHOUT CARDIOVERSION: SHX5443

## 2020-04-02 LAB — BASIC METABOLIC PANEL
Anion gap: 4 — ABNORMAL LOW (ref 5–15)
BUN: 23 mg/dL (ref 8–23)
CO2: 27 mmol/L (ref 22–32)
Calcium: 8 mg/dL — ABNORMAL LOW (ref 8.9–10.3)
Chloride: 111 mmol/L (ref 98–111)
Creatinine, Ser: 0.66 mg/dL (ref 0.44–1.00)
GFR calc Af Amer: >60 mL/min (ref >60)
GFR calc non Af Amer: >60 mL/min (ref >60)
Glucose, Bld: 110 mg/dL — ABNORMAL HIGH (ref 70–99)
Potassium: 3.7 mmol/L (ref 3.5–5.1)
Sodium: 142 mmol/L (ref 135–145)

## 2020-04-02 LAB — MAGNESIUM: Magnesium: 1.8 mg/dL (ref 1.7–2.4)

## 2020-04-02 SURGERY — ECHOCARDIOGRAM, TRANSESOPHAGEAL
Anesthesia: Moderate Sedation

## 2020-04-02 MED ORDER — FENTANYL CITRATE (PF) 100 MCG/2ML IJ SOLN
INTRAMUSCULAR | Status: AC
  Filled 2020-04-02: qty 2

## 2020-04-02 MED ORDER — POTASSIUM CHLORIDE CRYS ER 20 MEQ PO TBCR
20.0000 meq | EXTENDED_RELEASE_TABLET | Freq: Once | ORAL | Status: AC
  Administered 2020-04-02: 20 meq via ORAL
  Filled 2020-04-02: qty 1

## 2020-04-02 MED ORDER — SODIUM CHLORIDE FLUSH 0.9 % IV SOLN
INTRAVENOUS | Status: AC
  Filled 2020-04-02: qty 10

## 2020-04-02 MED ORDER — SODIUM CHLORIDE 0.9 % IV SOLN: INTRAVENOUS | Status: DC

## 2020-04-02 MED ORDER — MAGNESIUM SULFATE 2 GM/50ML IV SOLN
2.0000 g | Freq: Once | INTRAVENOUS | Status: AC
  Administered 2020-04-02: 2 g via INTRAVENOUS
  Filled 2020-04-02: qty 50

## 2020-04-02 MED ORDER — BUTAMBEN-TETRACAINE-BENZOCAINE 2-2-14 % EX AERO
INHALATION_SPRAY | CUTANEOUS | Status: AC
  Administered 2020-04-02: 1
  Filled 2020-04-02: qty 5

## 2020-04-02 MED ORDER — MAGNESIUM SULFATE 2 GM/50ML IV SOLN: 2.0000 g | Freq: Once | INTRAVENOUS | Status: DC

## 2020-04-02 MED ORDER — MIDAZOLAM HCL 5 MG/5ML IJ SOLN
INTRAMUSCULAR | Status: AC
  Filled 2020-04-02: qty 5

## 2020-04-02 MED ORDER — MIDAZOLAM HCL 2 MG/2ML IJ SOLN
INTRAMUSCULAR | Status: DC | PRN
  Administered 2020-04-02: 2 mg via INTRAVENOUS

## 2020-04-02 MED ORDER — LIDOCAINE VISCOUS HCL 2 % MT SOLN
OROMUCOSAL | Status: AC
  Administered 2020-04-02: 15 mL
  Filled 2020-04-02: qty 15

## 2020-04-02 NOTE — Progress Notes (Signed)
PROGRESS NOTE    Brittney Wood  WNI:[id number] DOB: 06/16/1925 DOA: 03/29/2020 PCP: Patient, No Pcp Per    Brief Narrative:  86 year old lady with past medical history of hypertension, COPD, CHF, GERD, who presents with septic shock secondary to infected right UPJ stone.  Pt is s/p of right nephrostomy tube placement by IR. Patient is stabilized. Current blood pressure 115/46. Patient did not require vasopressor. Pt is on broad antibiotics.  7/18: Patient seen and examined.  Hemodynamically stable.  No fevers noted over interval.  Daughter at bedside.  Stable for transfer to general medical floor.  Daughter and patient updated on plan of care.  7/19: Patient seen and examined.  Remains hemodynamically stable and afebrile.  No status changes over interval.  TTE negative.  2 daughters at bedside updated on plan of care.  7/20: Patient seen and examined.  N.p.o. for TEE today.  No acute status changes over interval.  No family members at bedside this morning.  Assessment & Plan:   Active Problems:   Severe sepsis (HCC)   MRSA bacteremia   Nephrolithiasis   Dementia without behavioral disturbance ([place])  Infected kidney stone status post nephrostomy tube placement MRSA bacteremia Severe sepsis secondary to above Status post nephrostomy Urine culture with staph aureus Nephrostomy urine culture gram-positive cocci Was on vancomycin and Rocephin, Rocephin discontinued Unclear source of MRSA bacteremia.  Patient colonized in nares TTE negative for vegetation Plan: Continue IV antibiotics, currently on vancomycin Monitor fever curve Follow cultures Infectious disease following TEE scheduled for 04/02/2020.  This will determine length of antibiotic course.  Appreciate infectious disease follow-up and recommendations regarding outpatient antibiotics. PT and OT consults Disposition planning  COPD Currently not exacerbated and on room air As needed nebs Continue  montelukast  Hypertension Continue Cozaar at half of home dose, 50 mg daily  Dementia Continue donepezil and memantine   DVT prophylaxis: Lovenox Code Status: DNR Family Communication: Daughter x 2 at bedside Disposition Plan: Status is: Inpatient  Remains inpatient appropriate because:Inpatient level of care appropriate due to severity of illness   Dispo: The patient is from: Home              Anticipated d/c is to: Home              Anticipated d/c date is: 2 days              Patient currently is not medically stable to d/c.  Continuing IV antibiotics and work-up for MRSA bacteremia.  Source is unusual.  Staph species in the urine generally do not cause bacteremia and sepsis.  TEE scheduled for today.  Depending on results of TEE patient will likely warrant 4 to 6 weeks of IV antibiotics.  Infectious disease following.  Consultants:   ID  Urology  Procedures:   Right nephrostomy tube  Antimicrobials:   Vancomycin   Subjective: Patient seen and examined.  Somewhat confused but may be related to underlying dementia.  No family members at bedside this morning.  Patient is in no visible distress Objective: Vitals:   04/02/20 1217 04/02/20 1230 04/02/20 1240 04/02/20 1251  BP: (!) 149/75 127/63  114/62  Pulse: 86 78 91 72  Resp: (!) 26 19 20 18   Temp:      TempSrc:      SpO2: 92% (!) 89% 94% 92%  Weight:      Height:        Intake/Output Summary (Last 24 hours) at 04/02/2020 1304 Last data filed  at 04/02/2020 1052 Gross per 24 hour  Intake 269.07 ml  Output 1275 ml  Net -1005.93 ml   Filed Weights   03/29/20 1636 03/31/20 0500 04/02/20 0500  Weight: 67.1 kg 65.8 kg 68.9 kg    Examination:  General exam: Appears calm and comfortable  Respiratory system: Clear to auscultation. Respiratory effort normal. Cardiovascular system: S1 & S2 heard, RRR. No JVD, murmurs, rubs, gallops or clicks. No pedal edema. Gastrointestinal system: Nontender, nondistended,  positive bowel sounds, right nephrostomy tube Central nervous system: Alert, oriented x2.  No focal deficits Extremities: Symmetric 5 x 5 power. Skin: No rashes, lesions or ulcers Psychiatry: Confused    Data Reviewed: I have personally reviewed following labs and imaging studies  CBC: Recent Labs  Lab 03/29/20 1104 03/30/20 0521  WBC 15.3* 7.3  NEUTROABS 13.9*  --   HGB 13.6 12.8  HCT 40.3 36.5  MCV 97.1 94.1  PLT 77* 69*   Basic Metabolic Panel: Recent Labs  Lab 03/29/20 1104 03/30/20 0521 03/31/20 0329 04/01/20 0507 04/02/20 0418  NA 139 141 140 140 142  K 3.6 3.3* 3.5 3.4* 3.7  CL 110 108 109 110 111  CO2 21* 23 26 27 27   GLUCOSE 120* 126* 128* 106* 110*  BUN 27* 25* 25* 26* 23  CREATININE 1.40* 1.13* 0.92 0.75 0.66  CALCIUM 8.1* 8.2* 7.9* 7.8* 8.0*  MG  --  1.8 1.9  --  1.8  PHOS  --  3.3 2.1* 3.3  --    GFR: Estimated Creatinine Clearance: 40.8 mL/min (by C-G formula based on SCr of 0.66 mg/dL). Liver Function Tests: Recent Labs  Lab 03/29/20 1104  AST 23  ALT 22  ALKPHOS 72  BILITOT 1.0  PROT 6.3*  ALBUMIN 3.1*   No results for input(s): LIPASE, AMYLASE in the last 168 hours. No results for input(s): AMMONIA in the last 168 hours. Coagulation Profile: Recent Labs  Lab 03/29/20 1104  INR 1.3*   Cardiac Enzymes: No results for input(s): CKTOTAL, CKMB, CKMBINDEX, TROPONINI in the last 168 hours. BNP (last 3 results) No results for input(s): PROBNP in the last 8760 hours. HbA1C: No results for input(s): HGBA1C in the last 72 hours. CBG: Recent Labs  Lab 03/29/20 1806  GLUCAP 117*   Lipid Profile: No results for input(s): CHOL, HDL, LDLCALC, TRIG, CHOLHDL, LDLDIRECT in the last 72 hours. Thyroid Function Tests: No results for input(s): TSH, T4TOTAL, FREET4, T3FREE, THYROIDAB in the last 72 hours. Anemia Panel: No results for input(s): VITAMINB12, FOLATE, FERRITIN, TIBC, IRON, RETICCTPCT in the last 72 hours. Sepsis Labs: Recent Labs   Lab 03/29/20 1104 03/29/20 1232  LATICACIDVEN 2.6* 2.9*    Recent Results (from the past 240 hour(s))  Blood Culture (routine x 2)     Status: Abnormal   Collection Time: 03/29/20 11:04 AM   Specimen: BLOOD  Result Value Ref Range Status   Specimen Description   Final    BLOOD BLOOD RIGHT ARM Performed at [facility], [address], [place], [place] [address]    Special Requests   Final    BOTTLES DRAWN AEROBIC AND ANAEROBIC Blood Culture adequate volume Performed at [facility], [address], [place], [place] [address]    Culture  Setup Time   Final    GRAM POSITIVE COCCI IN BOTH AEROBIC AND ANAEROBIC BOTTLES CRITICAL RESULT CALLED TO, READ BACK BY AND VERIFIED WITH: JASON ROBBINS 03/29/20 AT 2135 BY AR    Culture METHICILLIN RESISTANT STAPHYLOCOCCUS AUREUS (  A)  Final   Report Status 04/01/2020 FINAL  Final   Organism ID, Bacteria METHICILLIN RESISTANT STAPHYLOCOCCUS AUREUS  Final      Susceptibility   Methicillin resistant staphylococcus aureus - MIC*    CIPROFLOXACIN >=8 RESISTANT Resistant     ERYTHROMYCIN <=0.25 SENSITIVE Sensitive     GENTAMICIN <=0.5 SENSITIVE Sensitive     OXACILLIN >=4 RESISTANT Resistant     TETRACYCLINE <=1 SENSITIVE Sensitive     VANCOMYCIN <=0.5 SENSITIVE Sensitive     TRIMETH/SULFA <=10 SENSITIVE Sensitive     CLINDAMYCIN <=0.25 SENSITIVE Sensitive     RIFAMPIN <=0.5 SENSITIVE Sensitive     Inducible Clindamycin NEGATIVE Sensitive     * METHICILLIN RESISTANT STAPHYLOCOCCUS AUREUS  Blood Culture (routine x 2)     Status: Abnormal   Collection Time: 03/29/20 11:04 AM   Specimen: BLOOD  Result Value Ref Range Status   Specimen Description   Final    BLOOD BLOOD LEFT HAND Performed at [facility], [address] [place], [place] [address]    Special Requests   Final    BOTTLES DRAWN AEROBIC AND ANAEROBIC Blood Culture results may not be optimal due to an inadequate volume of blood received in culture  bottles Performed at [facility], [address] [place], [place] [address]    Culture  Setup Time   Final    GRAM POSITIVE COCCI IN BOTH AEROBIC AND ANAEROBIC BOTTLES CRITICAL VALUE NOTED.  VALUE IS CONSISTENT WITH PREVIOUSLY REPORTED AND CALLED VALUE.    Culture (A)  Final    STAPHYLOCOCCUS AUREUS SUSCEPTIBILITIES PERFORMED ON PREVIOUS CULTURE WITHIN THE LAST 5 DAYS. Performed at [facility], [place] [address], [place], [place] [address]    Report Status 04/01/2020 FINAL  Final  Urine culture     Status: Abnormal   Collection Time: 03/29/20 11:04 AM   Specimen: Urine, Random  Result Value Ref Range Status   Specimen Description   Final    URINE, RANDOM Performed at [facility], [address] [place], [place] [address]    Special Requests   Final    NONE Performed at [facility], [place], [place] [address]    Culture (A)  Final    >=100,000 COLONIES/mL METHICILLIN RESISTANT STAPHYLOCOCCUS AUREUS   Report Status 03/31/2020 FINAL  Final   Organism ID, Bacteria METHICILLIN RESISTANT STAPHYLOCOCCUS AUREUS (A)  Final      Susceptibility   Methicillin resistant staphylococcus aureus - MIC*    CIPROFLOXACIN >=8 RESISTANT Resistant     GENTAMICIN <=0.5 SENSITIVE Sensitive     NITROFURANTOIN <=16 SENSITIVE Sensitive     OXACILLIN >=4 RESISTANT Resistant     TETRACYCLINE <=1 SENSITIVE Sensitive     VANCOMYCIN <=0.5 SENSITIVE Sensitive     TRIMETH/SULFA <=10 SENSITIVE Sensitive     CLINDAMYCIN <=0.25 SENSITIVE Sensitive     RIFAMPIN <=0.5 SENSITIVE Sensitive     Inducible Clindamycin NEGATIVE Sensitive     * >=100,000 COLONIES/mL METHICILLIN RESISTANT STAPHYLOCOCCUS AUREUS  Blood Culture ID Panel (Reflexed)     Status: Abnormal   Collection Time: 03/29/20 11:04 AM  Result Value Ref Range Status   Enterococcus species NOT DETECTED NOT DETECTED Final   Listeria monocytogenes NOT DETECTED NOT DETECTED Final   Staphylococcus  species DETECTED (A) NOT DETECTED Final    Comment: CRITICAL RESULT CALLED TO, READ BACK BY AND VERIFIED WITH: JASON ROBBINS 03/29/20 AT 2135 BY ACR    Staphylococcus aureus (BCID)  DETECTED (A) NOT DETECTED Final    Comment: Methicillin (oxacillin)-resistant Staphylococcus aureus (MRSA). MRSA is predictably resistant to beta-lactam antibiotics (except ceftaroline). Preferred therapy is vancomycin unless clinically contraindicated. Patient requires contact precautions if  hospitalized. CRITICAL RESULT CALLED TO, READ BACK BY AND VERIFIED WITH: JASON ROBBINS 03/29/20 AT 2135 BY ACR    Methicillin resistance DETECTED (A) NOT DETECTED Final    Comment: CRITICAL RESULT CALLED TO, READ BACK BY AND VERIFIED WITH: JASON ROBBINS 03/29/20 AT 2135 BY ACR    Streptococcus species NOT DETECTED NOT DETECTED Final   Streptococcus agalactiae NOT DETECTED NOT DETECTED Final   Streptococcus pneumoniae NOT DETECTED NOT DETECTED Final   Streptococcus pyogenes NOT DETECTED NOT DETECTED Final   Acinetobacter baumannii NOT DETECTED NOT DETECTED Final   Enterobacteriaceae species NOT DETECTED NOT DETECTED Final   Enterobacter cloacae complex NOT DETECTED NOT DETECTED Final   Escherichia coli NOT DETECTED NOT DETECTED Final   Klebsiella oxytoca NOT DETECTED NOT DETECTED Final   Klebsiella pneumoniae NOT DETECTED NOT DETECTED Final   Proteus species NOT DETECTED NOT DETECTED Final   Serratia marcescens NOT DETECTED NOT DETECTED Final   Haemophilus influenzae NOT DETECTED NOT DETECTED Final   Neisseria meningitidis NOT DETECTED NOT DETECTED Final   Pseudomonas aeruginosa NOT DETECTED NOT DETECTED Final   Candida albicans NOT DETECTED NOT DETECTED Final   Candida glabrata NOT DETECTED NOT DETECTED Final   Candida krusei NOT DETECTED NOT DETECTED Final   Candida parapsilosis NOT DETECTED NOT DETECTED Final   Candida tropicalis NOT DETECTED NOT DETECTED Final    Comment: Performed at [facility], [address] [place], [place] [address]  SARS Coronavirus 2 by RT PCR (hospital order, performed in [place] hospital lab) Nasopharyngeal Nasopharyngeal Swab     Status: None   Collection Time: 03/29/20 12:32 PM   Specimen: Nasopharyngeal Swab  Result Value Ref Range Status   SARS Coronavirus 2 NEGATIVE NEGATIVE Final    Comment: (NOTE) SARS-CoV-2 target nucleic acids are NOT DETECTED.  The SARS-CoV-2 RNA is generally detectable in upper and lower respiratory specimens during the acute phase of infection. The lowest concentration of SARS-CoV-2 viral copies this assay can detect is 250 copies / mL. A negative result does not preclude SARS-CoV-2 infection and should not be used as the sole basis for treatment or other patient management decisions.  A negative result may occur with improper specimen collection / handling, submission of specimen other than nasopharyngeal swab, presence of viral mutation(s) within the areas targeted by this assay, and inadequate number of viral copies (<250 copies / mL). A negative result must be combined with clinical observations, patient history, and epidemiological information.  Fact Sheet for Patients:   StrictlyIdeas.no  Fact Sheet for Healthcare Providers: BankingDealers.co.za  This test is not yet approved or  cleared by the [place] FDA and has been authorized for detection and/or diagnosis of SARS-CoV-2 by FDA under an Emergency Use Authorization (EUA).  This EUA will remain in effect (meaning this test can be used) for the duration of the COVID-19 declaration under Section 564(b)(1) of the Act, 21 U.S.C. section 360bbb-3(b)(1), unless the authorization is terminated or revoked sooner.  Performed at [facility], [address] [place], [place] [address]   Aerobic/Anaerobic Culture (surgical/deep wound)     Status: None (Preliminary result)   Collection Time: 03/29/20  6:25 PM    Specimen: Kidney; Urine  Result Value Ref Range Status   Specimen Description   Final  KIDNEY Performed at [facility], [address], [place], [place] [address]    Special Requests   Final    Normal Performed at [facility], [place], [place] [address]    Gram Stain   Final    ABUNDANT WBC PRESENT, PREDOMINANTLY PMN ABUNDANT GRAM POSITIVE COCCI IN PAIRS IN CLUSTERS Performed at [facility], [place] [address], [place], [place] [address]    Culture   Final    ABUNDANT METHICILLIN RESISTANT STAPHYLOCOCCUS AUREUS NO ANAEROBES ISOLATED; CULTURE IN PROGRESS FOR 5 DAYS    Report Status PENDING  Incomplete   Organism ID, Bacteria METHICILLIN RESISTANT STAPHYLOCOCCUS AUREUS  Final      Susceptibility   Methicillin resistant staphylococcus aureus - MIC*    CIPROFLOXACIN >=8 RESISTANT Resistant     ERYTHROMYCIN 0.5 SENSITIVE Sensitive     GENTAMICIN <=0.5 SENSITIVE Sensitive     OXACILLIN >=4 RESISTANT Resistant     TETRACYCLINE <=1 SENSITIVE Sensitive     VANCOMYCIN <=0.5 SENSITIVE Sensitive     TRIMETH/SULFA <=10 SENSITIVE Sensitive     CLINDAMYCIN <=0.25 SENSITIVE Sensitive     RIFAMPIN <=0.5 SENSITIVE Sensitive     Inducible Clindamycin NEGATIVE Sensitive     * ABUNDANT METHICILLIN RESISTANT STAPHYLOCOCCUS AUREUS  MRSA PCR Screening     Status: Abnormal   Collection Time: 03/29/20  6:25 PM   Specimen: Nasal Mucosa; Nasopharyngeal  Result Value Ref Range Status   MRSA by PCR POSITIVE (A) NEGATIVE Final    Comment:        The GeneXpert MRSA Assay (FDA approved for NASAL specimens only), is one component of a comprehensive MRSA colonization surveillance program. It is not intended to diagnose MRSA infection nor to guide or monitor treatment for MRSA infections. RESULT CALLED TO, READ BACK BY AND VERIFIED WITH: RASHELLE RICHARDS 03/29/20 AT 1955 BY SL Performed at [facility], [address] [place], [place]  [address]   CULTURE, BLOOD (ROUTINE X 2) w Reflex to ID Panel     Status: None (Preliminary result)   Collection Time: 04/01/20  7:37 AM   Specimen: BLOOD  Result Value Ref Range Status   Specimen Description BLOOD LEFT ASSIST CONTROL  Final   Special Requests   Final    BOTTLES DRAWN AEROBIC AND ANAEROBIC Blood Culture adequate volume   Culture   Final    NO GROWTH 1 DAY Performed at [facility], [address], [place], [place] [address]    Report Status PENDING  Incomplete  CULTURE, BLOOD (ROUTINE X 2) w Reflex to ID Panel     Status: None (Preliminary result)   Collection Time: 04/01/20  7:51 AM   Specimen: BLOOD  Result Value Ref Range Status   Specimen Description BLOOD LEFT HAND  Final   Special Requests   Final    BOTTLES DRAWN AEROBIC AND ANAEROBIC Blood Culture adequate volume   Culture   Final    NO GROWTH 1 DAY Performed at [facility], [address], [place],  [address]    Report Status PENDING  Incomplete         Radiology Studies: ECHO TEE  Result Date: 04/02/2020    TRANSESOPHOGEAL ECHO REPORT   Patient Name:   [facility] Date of Exam: 04/02/2020 Medical Rec #:  [id number]    Height:       62.0 in Accession #:    [id number]   Weight:  151.9 lb Date of Birth:  03/19/1925    BSA:          1.701 m Patient Age:    63 years     BP:           149/75 mmHg Patient Gender: F            HR:           86 bpm. Exam Location:  [facility] Procedure: Transesophageal Echo, Cardiac Doppler and Color Doppler Indications:     Not listed on TEE order  History:         Patient has prior history of Echocardiogram examinations, most                  recent 03/31/2020. CHF, COPD; Risk Factors:Hypertension.  Sonographer:     Sherrie Sport RDCS (AE) Referring Phys:  [place] Diagnosing Phys: Serafina Royals MD PROCEDURE: The transesophogeal probe was passed without difficulty through the esophogus of the patient. Sedation performed by performing physician.  The patient developed no complications during the procedure. IMPRESSIONS  1. Left ventricular ejection fraction, by estimation, is 55 to 60%. The left ventricle has normal function. The left ventricle has no regional wall motion abnormalities.  2. Right ventricular systolic function is normal. The right ventricular size is normal.  3. No left atrial/left atrial appendage thrombus was detected.  4. The mitral valve is normal in structure. Mild mitral valve regurgitation.  5. The aortic valve is normal in structure. Aortic valve regurgitation is trivial.  6. Evidence of atrial level shunting detected by color flow Doppler. Agitated saline contrast bubble study was positive with shunting observed within 3-6 cardiac cycles suggestive of interatrial shunt. There is a small patent foramen ovale. FINDINGS  Left Ventricle: Left ventricular ejection fraction, by estimation, is 55 to 60%. The left ventricle has normal function. The left ventricle has no regional wall motion abnormalities. The left ventricular internal cavity size was normal in size. There is  no left ventricular hypertrophy. Right Ventricle: The right ventricular size is normal. No increase in right ventricular wall thickness. Right ventricular systolic function is normal. Left Atrium: Left atrial size was normal in size. No left atrial/left atrial appendage thrombus was detected. Right Atrium: Right atrial size was normal in size. Pericardium: There is no evidence of pericardial effusion. Mitral Valve: The mitral valve is normal in structure. Mild mitral valve regurgitation. There is no evidence of mitral valve vegetation. Tricuspid Valve: The tricuspid valve is normal in structure. Tricuspid valve regurgitation is trivial. There is no evidence of tricuspid valve vegetation. Aortic Valve: The aortic valve is normal in structure. Aortic valve regurgitation is trivial. There is no evidence of aortic valve vegetation. Pulmonic Valve: The pulmonic valve was  normal in structure. Pulmonic valve regurgitation is not visualized. Aorta: The aortic root and ascending aorta are structurally normal, with no evidence of dilitation. IAS/Shunts: The interatrial septum is aneurysmal. Evidence of atrial level shunting detected by color flow Doppler. Agitated saline contrast was given intravenously to evaluate for intracardiac shunting. Agitated saline contrast bubble study was positive  with shunting observed within 3-6 cardiac cycles suggestive of interatrial shunt. A small patent foramen ovale is detected. Serafina Royals MD Electronically signed by Serafina Royals MD Signature Date/Time: 04/02/2020/12:47:13 PM    Final         Scheduled Meds: . acetaminophen  650 mg Oral TID  . aspirin EC  81 mg Oral Daily  . Chlorhexidine Gluconate Cloth  6 each Topical Q0600  . donepezil  10 mg Oral QHS  . enoxaparin (LOVENOX) injection  40 mg Subcutaneous Q24H  . latanoprost  1 drop Both Eyes QHS  . losartan  50 mg Oral Daily  . memantine  10 mg Oral BID  . midazolam      . montelukast  10 mg Oral BH-q7a  . mupirocin ointment  1 application Nasal BID  . potassium chloride  20 mEq Oral Once  . sodium chloride flush  5 mL Intracatheter Q8H  . sodium chloride flush       Continuous Infusions: . sodium chloride 250 mL (04/02/20 0235)  . sodium chloride    . famotidine (PEPCID) IV Stopped (04/01/20 1908)  . vancomycin 1,000 mg (04/02/20 0236)     LOS: 4 days    Time spent: 25 minutes    Sidney Ace, MD [facility] Pager [phone]  If 7PM-7AM, please contact night-coverage 04/02/2020, 1:04 PM

## 2020-04-02 NOTE — Care Management Important Message (Signed)
Important Message  Patient Details  Name: Brittney Wood MRN: [id number] Date of Birth: March 01, 1925   Medicare Important Message Given:  Other (see comment)  Left voicemail with daughter, Fredirick Maudlin, to review Medicare IM.  Encouraged callback.   Dannette Barbara 04/02/2020, 12:18 PM

## 2020-04-02 NOTE — Progress Notes (Signed)
[facility] Cardiology [facility] Encounter Note  Patient: Brittney Wood / Admit Date: 03/29/2020 / Date of Encounter: 04/02/2020, 1:07 PM   Subjective: Patient is overall better today.  No evidence of significant pain or weakness or fatigue.  Blood pressure is well maintained. Patient underwent transesophageal echocardiogram for further evaluation of the possibility of endocarditis due to bacteremia Normal LV systolic function with ejection fraction of 55% with mild mitral regurgitation and tricuspid regurgitation with no evidence of vegetations of any valves.  Patient does have a mild patent foramen ovale  Review of Systems: Positive for: None Negative for: Vision change, hearing change, syncope, dizziness, nausea, vomiting,diarrhea, bloody stool, stomach pain, cough, congestion, diaphoresis, urinary frequency, urinary pain,skin lesions, skin rashes Others previously listed  Objective: Telemetry: Normal sinus rhythm Physical Exam: Blood pressure 114/62, pulse 72, temperature 98.3 F (36.8 C), temperature source Oral, resp. rate 18, height 5\' 2"  (1.575 m), weight 68.9 kg, SpO2 92 %. Body mass index is 27.78 kg/m. General: Well developed, well nourished, in no acute distress. Head: Normocephalic, atraumatic, sclera non-icteric, no xanthomas, nares are without discharge. Neck: No apparent masses Lungs: Normal respirations with no wheezes, no rhonchi, no rales , no crackles   Heart: Regular rate and rhythm, normal S1 S2, no murmur, no rub, no gallop, PMI is normal size and placement, carotid upstroke normal without bruit, jugular venous pressure normal Abdomen: Soft, non-tender, non-distended with normoactive bowel sounds. No hepatosplenomegaly. Abdominal aorta is normal size without bruit Extremities: No edema, no clubbing, no cyanosis, no ulcers,  Peripheral: 2+ radial, 2+ femoral, 2+ dorsal pedal pulses Neuro: Alert and oriented. Moves all extremities spontaneously. Psych:  Responds to  questions appropriately with a normal affect.   Intake/Output Summary (Last 24 hours) at 04/02/2020 1307 Last data filed at 04/02/2020 1052 Gross per 24 hour  Intake 269.07 ml  Output 1275 ml  Net -1005.93 ml    Inpatient Medications:  . acetaminophen  650 mg Oral TID  . aspirin EC  81 mg Oral Daily  . Chlorhexidine Gluconate Cloth  6 each Topical Q0600  . donepezil  10 mg Oral QHS  . enoxaparin (LOVENOX) injection  40 mg Subcutaneous Q24H  . latanoprost  1 drop Both Eyes QHS  . losartan  50 mg Oral Daily  . memantine  10 mg Oral BID  . midazolam      . montelukast  10 mg Oral BH-q7a  . mupirocin ointment  1 application Nasal BID  . potassium chloride  20 mEq Oral Once  . sodium chloride flush  5 mL Intracatheter Q8H  . sodium chloride flush       Infusions:  . sodium chloride 250 mL (04/02/20 0235)  . sodium chloride    . famotidine (PEPCID) IV Stopped (04/01/20 1908)  . vancomycin 1,000 mg (04/02/20 0236)    Labs: Recent Labs    03/31/20 0329 03/31/20 0329 04/01/20 0507 04/02/20 0418  NA 140   < > 140 142  K 3.5   < > 3.4* 3.7  CL 109   < > 110 111  CO2 26   < > 27 27  GLUCOSE 128*   < > 106* 110*  BUN 25*   < > 26* 23  CREATININE 0.92   < > 0.75 0.66  CALCIUM 7.9*   < > 7.8* 8.0*  MG 1.9  --   --  1.8  PHOS 2.1*  --  3.3  --    < > = values in this interval  not displayed.   No results for input(s): AST, ALT, ALKPHOS, BILITOT, PROT, ALBUMIN in the last 72 hours. No results for input(s): WBC, NEUTROABS, HGB, HCT, MCV, PLT in the last 72 hours. No results for input(s): CKTOTAL, CKMB, TROPONINI in the last 72 hours. Invalid input(s): POCBNP No results for input(s): HGBA1C in the last 72 hours.   Weights: Filed Weights   03/29/20 1636 03/31/20 0500 04/02/20 0500  Weight: 67.1 kg 65.8 kg 68.9 kg     Radiology/Studies:  CT ABDOMEN PELVIS WO CONTRAST  Result Date: 03/29/2020 CLINICAL DATA:  Sepsis. EXAM: CT ABDOMEN AND PELVIS WITHOUT CONTRAST TECHNIQUE:  Multidetector CT imaging of the abdomen and pelvis was performed following the standard protocol without IV contrast. COMPARISON:  None. FINDINGS: Lower chest: No acute abnormality. Bibasilar atelectasis/scarring. Emphysema in the medial right middle lobe. Hepatobiliary: Nodular liver contour. No focal liver abnormality is seen. Layering high-density sludge or small stones in the gallbladder neck. No gallbladder wall thickening or biliary dilatation. Pancreas: Unremarkable. No pancreatic ductal dilatation or surrounding inflammatory changes. Spleen: Moderate splenomegaly. Adrenals/Urinary Tract: 1.5 cm left adrenal adenoma. Right adrenal gland is unremarkable. 8 mm calculus at the right UPJ with resultant mild to moderate right hydronephrosis. Additional nonobstructive right renal calculi. 3.4 cm simple cyst in the upper pole of the right kidney. Subcentimeter low-density lesion in the midpole of the left kidney is too small to characterize. No left hydronephrosis. Multiple bladder diverticula. Stomach/Bowel: Small hiatal hernia. The stomach is otherwise within normal limits. No bowel wall thickening, distention, or surrounding inflammatory changes. There are a few colonic diverticula. Diminutive or absent appendix. Vascular/Lymphatic: Aortic atherosclerosis. No enlarged abdominal or pelvic lymph nodes. Reproductive: Status post hysterectomy. No adnexal masses. Other: Small fat containing umbilical hernia. No free fluid or pneumoperitoneum. Musculoskeletal: No acute or significant osseous findings. Prior right total hip arthroplasty. IMPRESSION: 1. 8 mm calculus at the right UPJ with resultant mild to moderate right hydronephrosis. 2. Additional nonobstructive right nephrolithiasis. 3. Cirrhosis with sequelae of portal hypertension including moderate splenomegaly. 4. Cholelithiasis versus sludge. 5. 1.5 cm left adrenal adenoma. 6. Aortic Atherosclerosis (ICD10-I70.0) and Emphysema (ICD10-J43.9). Electronically  Signed   By: Titus Dubin M.D.   On: 03/29/2020 14:43   DG Chest Port 1 View  Result Date: 03/30/2020 CLINICAL DATA:  86 year old female with respiratory distress EXAM: PORTABLE CHEST 1 VIEW COMPARISON:  03/30/2019 FINDINGS: Cardiomediastinal silhouette unchanged in size and contour. Mixed reticulonodular opacities of the bilateral lungs are unchanged from prior. No pneumothorax. No pleural effusion. No new confluent airspace disease. IMPRESSION: Unchanged appearance of the chest x-ray, with mild bilateral mixed reticulonodular opacities. Electronically Signed   By: Corrie Mckusick D.O.   On: 03/30/2020 09:14   DG Chest Port 1 View  Result Date: 03/29/2020 CLINICAL DATA:  Respiratory distress EXAM: PORTABLE CHEST 1 VIEW COMPARISON:  Earlier same day FINDINGS: Stable chronic interstitial prominence with potential superimposed mild edema. Patchy bibasilar atelectasis. No pleural effusion or pneumothorax. Stable cardiomediastinal contours. IMPRESSION: Stable chronic interstitial prominence with potential superimposed mild edema. Patchy bibasilar atelectasis. Electronically Signed   By: Macy Mis M.D.   On: 03/29/2020 21:00   DG Chest Port 1 View  Result Date: 03/29/2020 CLINICAL DATA:  86 year old female with history of sepsis. EXAM: PORTABLE CHEST 1 VIEW COMPARISON:  Chest x-ray 07/06/2015. FINDINGS: Lung volumes are low. No consolidative airspace disease. No pleural effusions. No pneumothorax. No pulmonary nodule or mass noted. Pulmonary vasculature and the cardiomediastinal silhouette are within normal limits. Atherosclerotic calcifications in  the thoracic aorta. Advanced degenerative changes of osteoarthritis are noted in shoulder joints bilaterally. IMPRESSION: 1. Low lung volumes without radiographic evidence of acute cardiopulmonary disease. 2. Aortic atherosclerosis. Electronically Signed   By: Vinnie Langton M.D.   On: 03/29/2020 11:36   ECHOCARDIOGRAM COMPLETE  Result Date: 03/31/2020     ECHOCARDIOGRAM REPORT   Patient Name:   [facility] Date of Exam: 03/31/2020 Medical Rec #:  [id number]    Height:       62.0 in Accession #:    [id number]   Weight:       145.1 lb Date of Birth:  1925-08-22    BSA:          1.668 m Patient Age:    66 years     BP:           146/67 mmHg Patient Gender: F            HR:           68 bpm. Exam Location:  [facility] Procedure: 2D Echo, Cardiac Doppler and Color Doppler Indications:     Bacteremia 790.7  History:         Patient has no prior history of Echocardiogram examinations.                  CHF, COPD; Risk Factors:Hypertension.  Sonographer:     Sherrie Sport RDCS (AE) Referring Phys:  [id number] Tsosie Billing Diagnosing Phys: Isaias Cowman MD  Sonographer Comments: Suboptimal apical window and no subcostal window. IMPRESSIONS  1. Left ventricular ejection fraction, by estimation, is 60 to 65%. The left ventricle has normal function. The left ventricle has no regional wall motion abnormalities. Left ventricular diastolic parameters are consistent with Grade I diastolic dysfunction (impaired relaxation).  2. Right ventricular systolic function is normal. The right ventricular size is normal. There is moderately elevated pulmonary artery systolic pressure.  3. The mitral valve is normal in structure. Mild mitral valve regurgitation. No evidence of mitral stenosis.  4. The aortic valve is normal in structure. Aortic valve regurgitation is not visualized. No aortic stenosis is present.  5. The inferior vena cava is normal in size with greater than 50% respiratory variability, suggesting right atrial pressure of 3 mmHg. FINDINGS  Left Ventricle: Left ventricular ejection fraction, by estimation, is 60 to 65%. The left ventricle has normal function. The left ventricle has no regional wall motion abnormalities. The left ventricular internal cavity size was normal in size. There is  no left ventricular hypertrophy. Left ventricular diastolic parameters are consistent  with Grade I diastolic dysfunction (impaired relaxation). Right Ventricle: The right ventricular size is normal. No increase in right ventricular wall thickness. Right ventricular systolic function is normal. There is moderately elevated pulmonary artery systolic pressure. The tricuspid regurgitant velocity is 3.05 m/s, and with an assumed right atrial pressure of 10 mmHg, the estimated right ventricular systolic pressure is 93.2 mmHg. Left Atrium: Left atrial size was normal in size. Right Atrium: Right atrial size was normal in size. Pericardium: There is no evidence of pericardial effusion. Mitral Valve: The mitral valve is normal in structure. Normal mobility of the mitral valve leaflets. Mild mitral valve regurgitation. No evidence of mitral valve stenosis. Tricuspid Valve: The tricuspid valve is normal in structure. Tricuspid valve regurgitation is mild . No evidence of tricuspid stenosis. Aortic Valve: The aortic valve is normal in structure. Aortic valve regurgitation is not visualized. No aortic stenosis is present. Aortic valve mean gradient measures 6.4  mmHg. Aortic valve peak gradient measures 10.8 mmHg. Aortic valve area, by VTI measures 1.90 cm. Pulmonic Valve: The pulmonic valve was normal in structure. Pulmonic valve regurgitation is not visualized. No evidence of pulmonic stenosis. Aorta: The aortic root is normal in size and structure. Venous: The inferior vena cava is normal in size with greater than 50% respiratory variability, suggesting right atrial pressure of 3 mmHg. IAS/Shunts: No atrial level shunt detected by color flow Doppler.  LEFT VENTRICLE PLAX 2D LVIDd:         3.03 cm  Diastology LVIDs:         2.05 cm  LV e' lateral:   3.92 cm/s LV PW:         0.99 cm  LV E/e' lateral: 22.8 LV IVS:        0.92 cm  LV e' medial:    3.59 cm/s LVOT diam:     2.00 cm  LV E/e' medial:  24.9 LV SV:         71 LV SV Index:   43 LVOT Area:     3.14 cm  RIGHT VENTRICLE RV Basal diam:  2.96 cm RV S prime:      17.30 cm/s TAPSE (M-mode): 3.8 cm LEFT ATRIUM             Index       RIGHT ATRIUM           Index LA diam:        3.40 cm 2.04 cm/m  RA Area:     18.40 cm LA Vol (A2C):   48.2 ml 28.90 ml/m RA Volume:   54.70 ml  32.80 ml/m LA Vol (A4C):   48.6 ml 29.14 ml/m LA Biplane Vol: 52.0 ml 31.18 ml/m  AORTIC VALVE                    PULMONIC VALVE AV Area (Vmax):    1.83 cm     PV Vmax:        0.59 m/s AV Area (Vmean):   1.64 cm     PV Peak grad:   1.4 mmHg AV Area (VTI):     1.90 cm     RVOT Peak grad: 3 mmHg AV Vmax:           164.00 cm/s AV Vmean:          118.980 cm/s AV VTI:            0.374 m AV Peak Grad:      10.8 mmHg AV Mean Grad:      6.4 mmHg LVOT Vmax:         95.30 cm/s LVOT Vmean:        62.300 cm/s LVOT VTI:          0.226 m LVOT/AV VTI ratio: 0.60  AORTA Ao Root diam: 2.40 cm MITRAL VALVE                TRICUSPID VALVE MV Area (PHT): 3.93 cm     TR Peak grad:   37.2 mmHg MV Decel Time: 193 msec     TR Vmax:        305.00 cm/s MV E velocity: 89.50 cm/s MV A velocity: 127.00 cm/s  SHUNTS MV E/A ratio:  0.70         Systemic VTI:  0.23 m  Systemic Diam: 2.00 cm Isaias Cowman MD Electronically signed by Isaias Cowman MD Signature Date/Time: 03/31/2020/10:28:49 AM    Final    ECHO TEE  Result Date: 04/02/2020    TRANSESOPHOGEAL ECHO REPORT   Patient Name:   [facility] Date of Exam: 04/02/2020 Medical Rec #:  [id number]    Height:       62.0 in Accession #:    [id number]   Weight:       151.9 lb Date of Birth:  02-23-1925    BSA:          1.701 m Patient Age:    8 years     BP:           149/75 mmHg Patient Gender: F            HR:           86 bpm. Exam Location:  [facility] Procedure: Transesophageal Echo, Cardiac Doppler and Color Doppler Indications:     Not listed on TEE order  History:         Patient has prior history of Echocardiogram examinations, most                  recent 03/31/2020. CHF, COPD; Risk Factors:Hypertension.  Sonographer:     Sherrie Sport  RDCS (AE) Referring Phys:  [place] Diagnosing Phys: Serafina Royals MD PROCEDURE: The transesophogeal probe was passed without difficulty through the esophogus of the patient. Sedation performed by performing physician. The patient developed no complications during the procedure. IMPRESSIONS  1. Left ventricular ejection fraction, by estimation, is 55 to 60%. The left ventricle has normal function. The left ventricle has no regional wall motion abnormalities.  2. Right ventricular systolic function is normal. The right ventricular size is normal.  3. No left atrial/left atrial appendage thrombus was detected.  4. The mitral valve is normal in structure. Mild mitral valve regurgitation.  5. The aortic valve is normal in structure. Aortic valve regurgitation is trivial.  6. Evidence of atrial level shunting detected by color flow Doppler. Agitated saline contrast bubble study was positive with shunting observed within 3-6 cardiac cycles suggestive of interatrial shunt. There is a small patent foramen ovale. FINDINGS  Left Ventricle: Left ventricular ejection fraction, by estimation, is 55 to 60%. The left ventricle has normal function. The left ventricle has no regional wall motion abnormalities. The left ventricular internal cavity size was normal in size. There is  no left ventricular hypertrophy. Right Ventricle: The right ventricular size is normal. No increase in right ventricular wall thickness. Right ventricular systolic function is normal. Left Atrium: Left atrial size was normal in size. No left atrial/left atrial appendage thrombus was detected. Right Atrium: Right atrial size was normal in size. Pericardium: There is no evidence of pericardial effusion. Mitral Valve: The mitral valve is normal in structure. Mild mitral valve regurgitation. There is no evidence of mitral valve vegetation. Tricuspid Valve: The tricuspid valve is normal in structure. Tricuspid valve regurgitation is trivial.  There is no evidence of tricuspid valve vegetation. Aortic Valve: The aortic valve is normal in structure. Aortic valve regurgitation is trivial. There is no evidence of aortic valve vegetation. Pulmonic Valve: The pulmonic valve was normal in structure. Pulmonic valve regurgitation is not visualized. Aorta: The aortic root and ascending aorta are structurally normal, with no evidence of dilitation. IAS/Shunts: The interatrial septum is aneurysmal. Evidence of atrial level shunting detected by color flow Doppler. Agitated saline contrast was given  intravenously to evaluate for intracardiac shunting. Agitated saline contrast bubble study was positive  with shunting observed within 3-6 cardiac cycles suggestive of interatrial shunt. A small patent foramen ovale is detected. Serafina Royals MD Electronically signed by Serafina Royals MD Signature Date/Time: 04/02/2020/12:47:13 PM    Final    IR NEPHROSTOMY PLACEMENT RIGHT  Result Date: 03/29/2020 CLINICAL DATA:  Sepsis and right renal obstruction with hydronephrosis secondary to an obstructing UPJ calculus. The patient requires a right percutaneous nephrostomy tube for emergent decompression of the right kidney. EXAM: 1. ULTRASOUND GUIDANCE FOR PUNCTURE OF THE RIGHT RENAL COLLECTING SYSTEM. 2. LEFT PERCUTANEOUS NEPHROSTOMY TUBE PLACEMENT. COMPARISON:  None. ANESTHESIA/SEDATION: 0.5 mg IV Versed Total Moderate Sedation Time 10 minutes. The patient's level of consciousness and physiologic status were continuously monitored during the procedure by Radiology nursing. CONTRAST:  10 mL Visipaque 320 ml Omnipaque 300 MEDICATIONS: None FLUOROSCOPY TIME:  24 seconds.  3.3 mGy. PROCEDURE: The procedure, risks, benefits, and alternatives were explained to the patient. Questions regarding the procedure were encouraged and answered. The patient understands and consents to the procedure. A time-out was performed prior to initiating the procedure. The right flank region was prepped  with chlorhexidine in a sterile fashion, and a sterile drape was applied covering the operative field. A sterile gown and sterile gloves were used for the procedure. Local anesthesia was provided with 1% Lidocaine. Ultrasound was used to localize the right kidney. Under direct ultrasound guidance, an 18 gauge trocar needle was advanced into the right renal collecting system. Ultrasound image documentation was performed. Aspiration of urine sample was performed followed by contrast injection. Percutaneous tract dilatation was then performed over the guidewire. A 10-French percutaneous nephrostomy tube was then advanced and formed in the collecting system. Catheter position was confirmed by fluoroscopy after contrast injection. The catheter was secured at the skin with a Prolene retention suture and Stat-Lock device. A gravity bag was placed. COMPLICATIONS: None. FINDINGS: There was return of grossly purulent urine from the right renal collecting system. A sample was sent for culture analysis. After placement of the 10 French nephrostomy tube the catheter was formed at the level of the renal pelvis. There is good return of purulent and blood tinged urine. IMPRESSION: Right percutaneous nephrostomy tube placement. There was return of grossly purulent urine. A sample was sent for culture analysis. A 10 French nephrostomy tube was placed and attached to gravity bag drainage. Electronically Signed   By: Aletta Edouard M.D.   On: 03/29/2020 17:43     Assessment and Recommendation  86 y.o. female with abnormal EKG and MRSA bacteremia of unknown etiology with a transesophageal echocardiogram showing no evidence of vegetation or LV systolic dysfunction 1.  Continue supportive care bacteremia and antibiotics as necessary looking for further source 2.  No further cardiac diagnostics necessary at this time 3.  Continue hypertension control as with losartan 4.  Call if further questions  Signed, Serafina Royals M.D.  FACC

## 2020-04-02 NOTE — TOC Initial Note (Signed)
Transition of Care [facility]) - Initial/Assessment Note    Patient Details  Name: Brittney Wood MRN: [id number] Date of Birth: [birth year]  [facility] CM/SW Contact:    Candie Chroman, LCSW Phone Number: 04/02/2020, 9:36 AM  Clinical Narrative: Patient not fully oriented and on isolation precautions. CSW called patient's daughter, Fredirick Maudlin, introduced role, and explained that PT recommendations would be discussed. Patient's daughter is agreeable to SNF placement. No preferences other than she does not want [facility]. Patient has had both COVID vaccines (Moderna). No further concerns. CSW encouraged patient's daughter to contact CSW as needed. CSW will continue to follow patient for support and facilitate discharge to SNF once medically stable.                 Expected Discharge Plan: Skilled Nursing Facility Barriers to Discharge: Continued Medical Work up   Patient Goals and CMS Choice Patient states their goals for this hospitalization and ongoing recovery are:: Patient not fully oriented.      Expected Discharge Plan and Services Expected Discharge Plan: [place] Choice: [place] arrangements for the past 2 months: [place]                                      Prior Living Arrangements/Services Living arrangements for the past 2 months: [place] Lives with:: Self Patient language and need for interpreter reviewed:: Yes Do you feel safe going back to the place where you live?: Yes      Need for Family Participation in Patient Care: Yes (Comment) Care giver support system in place?: Yes (comment)   Criminal Activity/Legal Involvement Pertinent to Current Situation/Hospitalization: No - Comment as needed  Activities of Daily Living Home Assistive Devices/Equipment: Walker (specify type), Bedside commode/3-in-1, Grab bars around toilet, Raised toilet seat with  rails, Grab bars in shower, Shower chair with back ADL Screening (condition at time of admission) Patient's cognitive ability adequate to safely complete daily activities?: No Is the patient deaf or have difficulty hearing?: Yes Does the patient have difficulty seeing, even when wearing glasses/contacts?: Yes Does the patient have difficulty concentrating, remembering, or making decisions?: Yes Patient able to express need for assistance with ADLs?: Yes Does the patient have difficulty dressing or bathing?: Yes Independently performs ADLs?: No Communication: Needs assistance Is this a change from baseline?: Pre-admission baseline Dressing (OT): Needs assistance Is this a change from baseline?: Pre-admission baseline Grooming: Needs assistance Feeding: Needs assistance Is this a change from baseline?: Pre-admission baseline Bathing: Needs assistance Is this a change from baseline?: Pre-admission baseline Toileting: Needs assistance Is this a change from baseline?: Pre-admission baseline In/Out Bed: Needs assistance Is this a change from baseline?: Pre-admission baseline Walks in Home: Needs assistance Is this a change from baseline?: Pre-admission baseline Does the patient have difficulty walking or climbing stairs?: Yes Weakness of Legs: Both Weakness of Arms/Hands: Both  Permission Sought/Granted Permission sought to share information with : Facility Sport and exercise psychologist, Family Supports    Share Information with NAME: Fredirick Maudlin  Permission granted to share info w AGENCY: SNF's  Permission granted to share info w Relationship: Daughter  Permission granted to share info w Contact Information: [phone]  Emotional Assessment Appearance:: Appears stated age Attitude/Demeanor/Rapport: Unable to Assess Affect (typically observed): Unable to Assess Orientation: : Oriented to Self, Oriented to Place, Oriented to  Time, Oriented to Situation Alcohol / Substance Use: Not  Applicable Psych Involvement: No (comment)  Admission diagnosis:  Hydronephrosis of right kidney [N13.30] Septic shock (HCC) [A41.9, R65.21] Patient Active Problem List   Diagnosis Date Noted  . MRSA bacteremia 03/31/2020  . Nephrolithiasis 03/31/2020  . Dementia without behavioral disturbance ([place]) 03/31/2020  . Severe sepsis ([place]) 03/29/2020  . COPD exacerbation ([place]) 07/06/2015   PCP:  Patient, No Pcp Per Pharmacy:   [place], [address] [place] [address] Phone: [phone] Fax: [phone]  [place], [place] [address] Phone: [phone] Fax: [phone]     Social Determinants of Health (SDOH) Interventions    Readmission Risk Interventions No flowsheet data found.

## 2020-04-02 NOTE — Progress Notes (Signed)
*  PRELIMINARY RESULTS* Echocardiogram Echocardiogram Transesophageal has been performed.  Brittney Wood 04/02/2020, 12:38 PM

## 2020-04-02 NOTE — CV Procedure (Signed)
Transesophageal echocardiogram preliminary report  Brittney Wood [id number] [birth year]  Preliminary diagnosis  Bacteremia with possible endocarditis  Postprocedural diagnosis Normal LV function without evidence of endocarditis or vegetation  Time out A timeout was performed by the nursing staff and physicians specifically identifying the procedure performed, identification of the patient, the type of sedation, all allergies and medications, all pertinent medical history, and presedation assessment of nasopharynx. The patient and or family understand the risks of the procedure including the rare risks of death, stroke, heart attack, esophogeal perforation, sore throat, and reaction to medications given.  Moderate sedation During this procedure the patient has received Versed 2 milligrams and fentanyl 0 micrograms to achieve appropriate moderate sedation.  The patient had continued monitoring of heart rate, oxygenation, blood pressure, respiratory rate, and extent of signs of sedation throughout the entire procedure.  The patient received this moderate sedation over a period of 11 minutes.  Both the nursing staff and I were present during the procedure when the patient had moderate sedation for 100% of the time.  Treatment considerations  No additional treatment considerations needed for bacteremia due to no current evidence of endocarditis  For further details of transesophageal echocardiogram please refer to final report.  Signed,  Corey Skains M.D. [facility] 04/02/2020 1:05 PM

## 2020-04-02 NOTE — NC FL2 (Signed)
[place] LEVEL OF CARE SCREENING TOOL     IDENTIFICATION  Patient Name: Brittney Wood Birthdate: June 16, 1925 Sex: female Admission Date (Current Location): 03/29/2020  [place] and [place] Number:  Engineering geologist and Address:  [facility], [address] [place], [place] [address]      Provider Number: [id number]  Attending Physician Name and Address:  Sidney Ace, MD  Relative Name and Phone Number:       Current Level of Care: Hospital Recommended Level of Care: [place] Prior Approval Number:    Date Approved/Denied:   PASRR Number: [id number]  Discharge Plan: SNF    Current Diagnoses: Patient Active Problem List   Diagnosis Date Noted  . MRSA bacteremia 03/31/2020  . Nephrolithiasis 03/31/2020  . Dementia without behavioral disturbance ([place]) 03/31/2020  . Severe sepsis ([place]) 03/29/2020  . COPD exacerbation ([place]) 07/06/2015    Orientation RESPIRATION BLADDER Height & Weight     Self, Time  O2 (Nasal Canula 2 L) Continent, External catheter Weight: 151 lb 14.4 oz (68.9 kg) Height:  5\' 2"  (157.5 cm)  BEHAVIORAL SYMPTOMS/MOOD NEUROLOGICAL BOWEL NUTRITION STATUS   (None)  (Dementia without behavioral disturbance) Incontinent Diet (Regular)  AMBULATORY STATUS COMMUNICATION OF NEEDS Skin   Limited Assist Verbally Bruising, Other (Comment) (MASD.)                       Personal Care Assistance Level of Assistance  Bathing, Feeding, Dressing Bathing Assistance: Limited assistance Feeding assistance: Limited assistance Dressing Assistance: Maximum assistance     Functional Limitations Info  Sight, Hearing, Speech Sight Info: Adequate Hearing Info: Adequate Speech Info: Adequate    SPECIAL CARE FACTORS FREQUENCY  PT (By licensed PT), OT (By licensed OT)                    Contractures Contractures Info: Not present    Additional Factors Info  Code Status, Allergies,  Isolation Precautions Code Status Info: DNR Allergies Info: Buprenorphine Hcl, Morphine and related.     Isolation Precautions Info: Contact: MRSA     Current Medications (04/02/2020):  This is the current hospital active medication list Current Facility-Administered Medications  Medication Dose Route Frequency Provider Last Rate Last Admin  . 0.9 %  sodium chloride infusion   Intravenous PRN Ralene Muskrat B, MD 10 mL/hr at 04/02/20 0235 250 mL at 04/02/20 0235  . acetaminophen (TYLENOL) tablet 650 mg  650 mg Oral Q4H PRN Flora Lipps, MD   650 mg at 04/02/20 0238  . acetaminophen (TYLENOL) tablet 650 mg  650 mg Oral TID Ralene Muskrat B, MD   650 mg at 04/01/20 2330  . aspirin EC tablet 81 mg  81 mg Oral Daily Ralene Muskrat B, MD   81 mg at 04/01/20 0902  . Chlorhexidine Gluconate Cloth 2 % PADS 6 each  6 each Topical Q0600 Sidney Ace, MD   6 each at 04/02/20 [phone]  . docusate sodium (COLACE) capsule 100 mg  100 mg Oral BID PRN Flora Lipps, MD      . donepezil (ARICEPT) tablet 10 mg  10 mg Oral QHS Ralene Muskrat B, MD   10 mg at 04/01/20 2331  . enoxaparin (LOVENOX) injection 40 mg  40 mg Subcutaneous Q24H Ralene Muskrat B, MD   40 mg at 04/01/20 2332  . famotidine (PEPCID) IVPB 20 mg premix  20 mg Intravenous Q24H Flora Lipps, MD   Stopped at  04/01/20 1908  . hydrALAZINE (APRESOLINE) injection 10 mg  10 mg Intravenous Q4H PRN Sreenath, Sudheer B, MD      . ipratropium-albuterol (DUONEB) 0.5-2.5 (3) MG/3ML nebulizer solution 3 mL  3 mL Nebulization Q4H PRN Tukov-Yual, Magdalene S, NP   3 mL at 03/29/20 2020  . latanoprost (XALATAN) 0.005 % ophthalmic solution 1 drop  1 drop Both Eyes QHS Ralene Muskrat B, MD   1 drop at 04/01/20 2331  . loperamide (IMODIUM) capsule 2 mg  2 mg Oral PRN Tukov-Yual, Magdalene S, NP   2 mg at 03/31/20 1323  . losartan (COZAAR) tablet 50 mg  50 mg Oral Daily Ralene Muskrat B, MD   50 mg at 04/01/20 0651  . memantine (NAMENDA)  tablet 10 mg  10 mg Oral BID Ralene Muskrat B, MD   10 mg at 04/01/20 2330  . montelukast (SINGULAIR) tablet 10 mg  10 mg Oral Braulio Conte, Sudheer B, MD   10 mg at 04/02/20 0649  . mupirocin ointment (BACTROBAN) 2 % 1 application  1 application Nasal BID Sidney Ace, MD   1 application at 09/22/30 2331  . ondansetron (ZOFRAN) injection 4 mg  4 mg Intravenous Q6H PRN Flora Lipps, MD      . polyethylene glycol (MIRALAX / GLYCOLAX) packet 17 g  17 g Oral Daily PRN Flora Lipps, MD      . sodium chloride flush (NS) 0.9 % injection 5 mL  5 mL Intracatheter Q8H Aletta Edouard, MD   5 mL at 04/02/20 0704  . vancomycin (VANCOCIN) IVPB 1000 mg/200 mL premix  1,000 mg Intravenous Q24H Ralene Muskrat B, MD 200 mL/hr at 04/02/20 0236 1,000 mg at 04/02/20 0236     Discharge Medications: Please see discharge summary for a list of discharge medications.  Relevant Imaging Results:  Relevant Lab Results:   Additional Information SS#: [id number]. Has a nephrostomy tube. May need IV abx at discharge.  Candie Chroman, LCSW

## 2020-04-02 NOTE — Progress Notes (Signed)
[place] for Electrolyte Monitoring and Replacement   Recent Labs: Potassium (mmol/L)  Date Value  04/02/2020 3.7  01/03/2015 3.9   Magnesium (mg/dL)  Date Value  04/02/2020 1.8   Calcium (mg/dL)  Date Value  04/02/2020 8.0 (L)   Calcium, Total (mg/dL)  Date Value  01/03/2015 8.2 (L)   Albumin (g/dL)  Date Value  03/29/2020 3.1 (L)  01/18/2014 2.9 (L)   Phosphorus (mg/dL)  Date Value  04/01/2020 3.3   Sodium (mmol/L)  Date Value  04/02/2020 142  01/03/2015 134 (L)   Corrected Ca: 8.72 mg/dL  Assessment: 86 year old female withhypertension, COPD, HFpEF, GERD, who presents with septic shock secondary to infectedright UPJ stone. Pt is s/p of right nephrostomy tube placement by IR now with MRSA bacteremia.   Goal of Therapy:  Potassium 4.0 - 5.1 mmol/L Magnesium 2.0 - 2.4 mg/dL All Other Electrolytes WNL  Plan:   Oral KCl 20 mEq x 1  Magnesium sulfate 2 grams IV x 1  Follow up electrolytes with morning labs 7/21  Dallie Piles ,PharmD Clinical Pharmacist 04/02/2020 12:23 PM

## 2020-04-02 NOTE — Progress Notes (Signed)
ID  Patient underwent TEE today. Doing well Patient Vitals for the past 24 hrs:  BP Temp Temp src Pulse Resp SpO2 Weight  04/02/20 1329 (!) 150/67 98.4 F (36.9 C) Oral 73 20 99 % --  04/02/20 1301 (!) 108/56 -- -- 68 17 92 % --  04/02/20 1251 114/62 -- -- 72 18 92 % --  04/02/20 1240 -- -- -- 91 20 94 % --  04/02/20 1230 127/63 -- -- 78 19 (!) 89 % --  04/02/20 1217 (!) 149/75 -- -- 86 (!) 26 92 % --  04/02/20 1212 (!) 144/51 -- -- 98 (!) 26 94 % --  04/02/20 1107 (!) 173/69 -- -- 71 (!) 22 95 % --  04/02/20 1049 (!) 160/73 98.3 F (36.8 C) Oral 71 18 98 % --  04/02/20 0500 -- -- -- -- -- -- 68.9 kg  04/01/20 2348 (!) 158/71 98 F (36.7 C) Oral 66 19 99 % --  04/01/20 2050 (!) 171/79 98.3 F (36.8 C) Oral 75 20 97 % --  On examination awake and alert Chest bilateral air entry HS S1-S2 Abdomen soft Right nephrostomy  Labs CBC Latest Ref Rng & Units 03/30/2020 03/29/2020 07/06/2015  WBC 4.0 - 10.5 K/uL 7.3 15.3(H) 6.5  Hemoglobin 12.0 - 15.0 g/dL 12.8 13.6 10.8(L)  Hematocrit 36 - 46 % 36.5 40.3 34.3(L)  Platelets 150 - 400 K/uL 69(L) 77(L) 154    CMP Latest Ref Rng & Units 04/02/2020 04/01/2020 03/31/2020  Glucose 70 - 99 mg/dL 110(H) 106(H) 128(H)  BUN 8 - 23 mg/dL 23 26(H) 25(H)  Creatinine 0.44 - 1.00 mg/dL 0.66 0.75 0.92  Sodium 135 - 145 mmol/L 142 140 140  Potassium 3.5 - 5.1 mmol/L 3.7 3.4(L) 3.5  Chloride 98 - 111 mmol/L 111 110 109  CO2 22 - 32 mmol/L 27 27 26   Calcium 8.9 - 10.3 mg/dL 8.0(L) 7.8(L) 7.9(L)  Total Protein 6.5 - 8.1 g/dL - - -  Total Bilirubin 0.3 - 1.2 mg/dL - - -  Alkaline Phos 38 - 126 U/L - - -  AST 15 - 41 U/L - - -  ALT 0 - 44 U/L - - -    Micro 03/29/2020 blood culture methicillin-resistant staph aureus 03/29/2020 nephrostomy tube culture methicillin-resistant staph aureus 03/29/2020 urine culture methicillin-resistant staph aureus 04/01/2020 blood culture no growth so far   Impression/recommendation MRSA bacteremia with MRSA urine  culture MRSA nephrostomy tube culture TEE negative Repeat blood culture from 04/01/2020 is negative Patient can have PICC placement She is going to need at least 4 weeks of IV vancomycin as outpatient  Bilateral TKA  COPD  Dementia On donepezil and memantine

## 2020-04-03 ENCOUNTER — Encounter: Payer: Self-pay | Admitting: Internal Medicine

## 2020-04-03 ENCOUNTER — Inpatient Hospital Stay: Payer: Self-pay

## 2020-04-03 DIAGNOSIS — N200 Calculus of kidney: Secondary | ICD-10-CM

## 2020-04-03 DIAGNOSIS — N1330 Unspecified hydronephrosis: Secondary | ICD-10-CM

## 2020-04-03 DIAGNOSIS — G9341 Metabolic encephalopathy: Secondary | ICD-10-CM

## 2020-04-03 DIAGNOSIS — F0390 Unspecified dementia without behavioral disturbance: Secondary | ICD-10-CM

## 2020-04-03 DIAGNOSIS — R6520 Severe sepsis without septic shock: Secondary | ICD-10-CM

## 2020-04-03 DIAGNOSIS — I10 Essential (primary) hypertension: Secondary | ICD-10-CM

## 2020-04-03 DIAGNOSIS — A4102 Sepsis due to Methicillin resistant Staphylococcus aureus: Secondary | ICD-10-CM

## 2020-04-03 DIAGNOSIS — J9601 Acute respiratory failure with hypoxia: Secondary | ICD-10-CM

## 2020-04-03 LAB — RENAL FUNCTION PANEL
Albumin: 2.2 g/dL — ABNORMAL LOW (ref 3.5–5.0)
Anion gap: 7 (ref 5–15)
BUN: 18 mg/dL (ref 8–23)
CO2: 24 mmol/L (ref 22–32)
Calcium: 8 mg/dL — ABNORMAL LOW (ref 8.9–10.3)
Chloride: 108 mmol/L (ref 98–111)
Creatinine, Ser: 0.7 mg/dL (ref 0.44–1.00)
GFR calc Af Amer: >60 mL/min (ref >60)
GFR calc non Af Amer: >60 mL/min (ref >60)
Glucose, Bld: 142 mg/dL — ABNORMAL HIGH (ref 70–99)
Phosphorus: 2.9 mg/dL (ref 2.5–4.6)
Potassium: 3.9 mmol/L (ref 3.5–5.1)
Sodium: 139 mmol/L (ref 135–145)

## 2020-04-03 LAB — VANCOMYCIN, RANDOM: Vancomycin Rm: 48

## 2020-04-03 LAB — MAGNESIUM: Magnesium: 1.8 mg/dL (ref 1.7–2.4)

## 2020-04-03 LAB — SARS CORONAVIRUS 2 BY RT PCR (HOSPITAL ORDER, PERFORMED IN ~~LOC~~ HOSPITAL LAB): SARS Coronavirus 2: NEGATIVE

## 2020-04-03 MED ORDER — VANCOMYCIN IV (FOR PTA / DISCHARGE USE ONLY): 1000.0000 mg | INTRAVENOUS | 0 refills | Status: AC

## 2020-04-03 MED ORDER — POLYETHYLENE GLYCOL 3350 17 G PO PACK: 17.0000 g | PACK | Freq: Every day | ORAL | 0 refills | Status: AC | PRN

## 2020-04-03 MED ORDER — SODIUM CHLORIDE 0.9% FLUSH: INTRAVENOUS | 0 refills | Status: DC

## 2020-04-03 MED ORDER — LOSARTAN POTASSIUM 50 MG PO TABS: 50.0000 mg | ORAL_TABLET | Freq: Every day | ORAL | 0 refills | Status: AC

## 2020-04-03 MED ORDER — SODIUM CHLORIDE 0.9% FLUSH: 10.0000 mL | INTRAVENOUS | Status: DC | PRN

## 2020-04-03 MED ORDER — FAMOTIDINE 20 MG PO TABS
20.0000 mg | ORAL_TABLET | Freq: Every day | ORAL | Status: DC
  Administered 2020-04-03: 20 mg via ORAL
  Filled 2020-04-03: qty 1

## 2020-04-03 MED ORDER — MAGNESIUM SULFATE 2 GM/50ML IV SOLN
2.0000 g | Freq: Once | INTRAVENOUS | Status: AC
  Administered 2020-04-03: 2 g via INTRAVENOUS
  Filled 2020-04-03: qty 50

## 2020-04-03 MED ORDER — POTASSIUM CHLORIDE CRYS ER 20 MEQ PO TBCR
20.0000 meq | EXTENDED_RELEASE_TABLET | Freq: Once | ORAL | Status: AC
  Administered 2020-04-03: 20 meq via ORAL
  Filled 2020-04-03: qty 1

## 2020-04-03 MED ORDER — MUPIROCIN 2 % EX OINT: 1.0000 "application " | TOPICAL_OINTMENT | Freq: Two times a day (BID) | CUTANEOUS | 0 refills | Status: AC

## 2020-04-03 NOTE — Progress Notes (Signed)
Peripherally Inserted Central Catheter Placement  The IV Nurse has discussed with the patient and/or persons authorized to consent for the patient, the purpose of this procedure and the potential benefits and risks involved with this procedure.  The benefits include less needle sticks, lab draws from the catheter, and the patient may be discharged home with the catheter. Risks include, but not limited to, infection, bleeding, blood clot (thrombus formation), and puncture of an artery; nerve damage and irregular heartbeat and possibility to perform a PICC exchange if needed/ordered by physician.  Alternatives to this procedure were also discussed.  Bard Power PICC patient education guide, fact sheet on infection prevention and patient information card has been provided to patient /or left at bedside.    PICC Placement Documentation  PICC Single Lumen 15/52/08 PICC Right Basilic 36 cm 0 cm (Active)  Indication for Insertion or Continuance of Line Prolonged intravenous therapies 04/03/20 1527  Exposed Catheter (cm) 0 cm 04/03/20 1527  Site Assessment Clean;Dry;Intact 04/03/20 1527  Line Status Flushed;Blood return noted;Saline locked 04/03/20 1527  Dressing Type Transparent 04/03/20 1527  Dressing Status Dry;Clean;Intact;Antimicrobial disc in place 04/03/20 1527  Dressing Change Due 04/10/20 04/03/20 1527    Family signed the PICC consent   Scotty Court 04/03/2020, 3:29 PM

## 2020-04-03 NOTE — TOC Progression Note (Addendum)
Transition of Care [facility]) - Progression Note    Patient Details  Name: Brittney Wood MRN: [id number] Date of Birth: 16-Aug-1925  Transition of Care [facility]) CM/SW [place], LCSW Phone Number: 04/03/2020, 9:09 AM  Clinical Narrative: Patient has two bed offers: [place]. [facility] has not responded yet. CSW left voicemail for patient's daughter Fredirick Maudlin. Will provide bed offers when she calls back.    11:38 am: Patient's daughters have chosen [facility]. Sent message to MD requesting rapid COVID test. SNF can take patient today if we get her results back.  3:10 pm: COVID results are negative. PICC is being placed now. [facility] is able to accept her today.  Expected Discharge Plan: [place] Barriers to Discharge: Continued Medical Work up  Expected Discharge Plan and Services Expected Discharge Plan: [place] Choice: [place] arrangements for the past 2 months: [place]                                       Social Determinants of Health (SDOH) Interventions    Readmission Risk Interventions No flowsheet data found.

## 2020-04-03 NOTE — Discharge Summary (Signed)
[facility] NAME: Ashonti Leandro    MR#:  [id number]  [place]:  1924-12-22  DATE OF ADMISSION:  03/29/2020 ADMITTING PHYSICIAN: No admitting provider for patient encounter.  DATE OF DISCHARGE: 04/03/2020  PRIMARY CARE PHYSICIAN: Patient, No Pcp Per    ADMISSION DIAGNOSIS:  Hydronephrosis of right kidney [N13.30] Septic shock ([place]) [A41.9, R65.21]  DISCHARGE DIAGNOSIS:  Active Problems:   Severe sepsis ([place])   MRSA bacteremia   Nephrolithiasis   Dementia without behavioral disturbance ([place])   SECONDARY DIAGNOSIS:   Past Medical History:  Diagnosis Date  . Cancer ([place])    skin  . CHF (congestive heart failure) ([place])   . COPD (chronic obstructive pulmonary disease) ([place])   . Hypertension     HOSPITAL COURSE:   1.  Severe sepsis, MRSA sepsis, with acute metabolic encephalopathy.  Infected urine with obstructive nephropathy.  Patient had a nephrostomy tube placement.  Urine and blood cultures with staph aureus MRSA.  Patient seen by infectious disease.  She recommended 4 weeks of IV vancomycin.  Your pharmacist will have to dose based on troughs.  Continue treatment through 04/27/2020.  PICC line currently being placed.  Flush before and after antibiotics with normal saline and daily.  TEE negative for vegetation.  Weekly laboratory data to Dr. Steva Ready.  Repeat blood cultures 2 days ago negative. 2.  Obstructive kidney stone.  Seen in consultation by urology.  Urology (Dr. Lovena Neighbours and Dr. Diamantina Providence) recommends antibiotic treatment and stone evaluation as outpatient.  Continue nephrostomy tubes.  Nephrostomy tube to be flushed with normal saline every 8 hours.  Cover dressing every 3 days.  Empty nephrostomy bag every 8 hours. 3.  Acute hypoxic respiratory failure.  Patient tapered to room air. 4.  COPD continue Singulair 5.  Essential hypertension continue Cozaar at half the dose 6.  Dementia without behavioral disturbance on  Aricept and Namenda  DISCHARGE CONDITIONS:   [place]  CONSULTS OBTAINED:  Treatment Team:  Ceasar Mons, MD Corey Skains, MD  DRUG ALLERGIES:   Allergies  Allergen Reactions  . Buprenorphine Hcl     Other reaction(s): Other (See Comments) Hallucinations  . Morphine And Related Other (See Comments)    Hallucinations    DISCHARGE MEDICATIONS:   Allergies as of 04/03/2020      Reactions   Buprenorphine Hcl    Other reaction(s): Other (See Comments) Hallucinations   Morphine And Related Other (See Comments)   Hallucinations      Medication List    STOP taking these medications   amLODipine 5 MG tablet Commonly known as: NORVASC     TAKE these medications   acetaminophen 650 MG CR tablet Commonly known as: TYLENOL Take 650 mg by mouth 3 (three) times daily.   aspirin 81 MG EC tablet Take by mouth.   donepezil 10 MG tablet Commonly known as: ARICEPT Take 10 mg by mouth at bedtime.   latanoprost 0.005 % ophthalmic solution Commonly known as: XALATAN 1 drop at bedtime.   losartan 50 MG tablet Commonly known as: COZAAR Take 1 tablet (50 mg total) by mouth daily. Start taking on: April 04, 2020 What changed:   medication strength  how much to take   memantine 10 MG tablet Commonly known as: NAMENDA Take 10 mg by mouth 2 (two) times daily.   montelukast 10 MG tablet Commonly known as: SINGULAIR Take 10 mg by mouth every morning.   mupirocin ointment 2 %  Commonly known as: BACTROBAN Place 1 application into the nose 2 (two) times daily for 3 days.   omeprazole 20 MG capsule Commonly known as: PRILOSEC Take 20 mg by mouth daily.   polyethylene glycol 17 g packet Commonly known as: MIRALAX / GLYCOLAX Take 17 g by mouth daily as needed for moderate constipation.   sodium chloride flush 0.9 % Soln Commonly known as: NS 5 ml flush nephrostomy tube every eight hours 22m flush picc line before and after antibiotic and daily    vancomycin  IVPB Inject 1,000 mg into the vein daily for 23 days. Indication:  MRSA bacteremia First Dose: No Last Day of Therapy:  04/27/20 Labs - _0 /21/21 1509           DISCHARGE INSTRUCTIONS:   Follow-up with team at rehab 1 day Follow-up with Dr. RSteva Ready2 weeks Follow-up with urology 2 weeks  If you experience worsening of your admission symptoms, develop shortness of breath, life threatening emergency, suicidal or homicidal thoughts you must seek medical attention immediately by calling 911 or calling your MD immediately  if symptoms less severe.  You Must read complete instructions/literature along with all the possible adverse reactions/side effects for all the Medicines you take and that have been prescribed to you. Take any new Medicines after you have completely understood and accept all the possible adverse reactions/side effects.   Please note  You were cared for by a hospitalist during your hospital stay. If you have any questions about your discharge medications or the care you received while you were in the hospital after you are discharged, you can call the unit and asked to speak with the hospitalist on call if the hospitalist that took care of you is not available. Once you are discharged, your  primary care physician will handle any further medical issues. Please note that NO REFILLS for any discharge medications will be authorized once you are discharged, as it is imperative that you return to your primary care physician (or establish a relationship with a primary care physician if you do not have one) for your aftercare needs so that they can reassess your need for medications and monitor your lab values.    Today   CHIEF COMPLAINT:   Chief Complaint  Patient presents with  . Altered Mental Status    HISTORY OF PRESENT ILLNESS:  RDaviana Haymaker is a 86y.o. female was brought in with altered mental status   VITAL SIGNS:  Blood pressure (!) 155/84, pulse (!) 105, temperature 98.9 F (37.2 C), temperature source Oral, resp. rate 20, height 5' 2" (1.575 m), weight 68.9 kg, SpO2 96 %.  I/O:    Intake/Output Summary (Last 24 hours) at 04/03/2020 1511 Last  data filed at 04/03/2020 1106 Gross per 24 hour  Intake 690 ml  Output 1550 ml  Net -860 ml     PHYSICAL EXAMINATION:  GENERAL:  86 y.o.-year-old patient lying in the bed with no acute distress.  EYES: Pupils equal, round, reactive to light and accommodation. No scleral icterus. Extraocular muscles intact.  HEENT: Head atraumatic, normocephalic. Oropharynx and nasopharynx clear.  NECK:  Supple, no jugular venous distention. No thyroid enlargement, no tenderness.  LUNGS: Normal breath sounds bilaterally, no wheezing, rales,rhonchi or crepitation. No use of accessory muscles of respiration.  CARDIOVASCULAR: S1, S2 normal. No murmurs, rubs, or gallops.  ABDOMEN: Soft, non-tender, non-distended. Bowel sounds present. No organomegaly or mass.  EXTREMITIES: No pedal edema, cyanosis, or clubbing.  NEUROLOGIC: Cranial nerves II through XII are intact. Muscle strength 5/5 in all extremities. Sensation intact. Gait not checked.  PSYCHIATRIC: The patient is alert and oriented x 3.  SKIN: No obvious rash, lesion, or ulcer.   DATA  REVIEW:   CBC Recent Labs  Lab 03/30/20 0521  WBC 7.3  HGB 12.8  HCT 36.5  PLT 69*    Chemistries  Recent Labs  Lab 03/29/20 1104 03/30/20 0521 04/03/20 0218  NA 139   < > 139  K 3.6   < > 3.9  CL 110   < > 108  CO2 21*   < > 24  GLUCOSE 120*   < > 142*  BUN 27*   < > 18  CREATININE 1.40*   < > 0.70  CALCIUM 8.1*   < > 8.0*  MG  --    < > 1.8  AST 23  --   --   ALT 22  --   --   ALKPHOS 72  --   --   BILITOT 1.0  --   --    < > = values in this interval not displayed.    Microbiology Results  Results for orders placed or performed during the hospital encounter of 03/29/20  Blood Culture (routine x 2)     Status: Abnormal   Collection Time: 03/29/20 11:04 AM   Specimen: BLOOD  Result Value Ref Range Status   Specimen Description   Final    BLOOD BLOOD RIGHT ARM Performed at [facility], [address], [place], [place] [address]    Special Requests   Final    BOTTLES DRAWN AEROBIC AND ANAEROBIC Blood Culture adequate volume Performed at [facility], [address] [place], [place] [address]    Culture  Setup Time   Final    GRAM POSITIVE COCCI IN BOTH AEROBIC AND ANAEROBIC BOTTLES CRITICAL RESULT CALLED TO, READ BACK BY AND VERIFIED WITH: JASON ROBBINS 03/29/20 AT 2135 BY AR    Culture METHICILLIN RESISTANT STAPHYLOCOCCUS AUREUS (A)  Final   Report Status 04/01/2020 FINAL  Final   Organism ID, Bacteria METHICILLIN RESISTANT STAPHYLOCOCCUS AUREUS  Final      Susceptibility   Methicillin resistant staphylococcus aureus - MIC*    CIPROFLOXACIN >=8 RESISTANT Resistant     ERYTHROMYCIN <=0.25 SENSITIVE Sensitive     GENTAMICIN <=0.5 SENSITIVE Sensitive     OXACILLIN >=4 RESISTANT Resistant     TETRACYCLINE <=1 SENSITIVE Sensitive     VANCOMYCIN <=0.5 SENSITIVE Sensitive     TRIMETH/SULFA <=10 SENSITIVE Sensitive     CLINDAMYCIN <=0.25 SENSITIVE Sensitive     RIFAMPIN <=0.5 SENSITIVE Sensitive     Inducible Clindamycin NEGATIVE  Sensitive     *  METHICILLIN RESISTANT STAPHYLOCOCCUS AUREUS  Blood Culture (routine x 2)     Status: Abnormal   Collection Time: 03/29/20 11:04 AM   Specimen: BLOOD  Result Value Ref Range Status   Specimen Description   Final    BLOOD BLOOD LEFT HAND Performed at [facility], [address] [place], [place] [address]    Special Requests   Final    BOTTLES DRAWN AEROBIC AND ANAEROBIC Blood Culture results may not be optimal due to an inadequate volume of blood received in culture bottles Performed at [facility], [address] [place], [place] [address]    Culture  Setup Time   Final    GRAM POSITIVE COCCI IN BOTH AEROBIC AND ANAEROBIC BOTTLES CRITICAL VALUE NOTED.  VALUE IS CONSISTENT WITH PREVIOUSLY REPORTED AND CALLED VALUE.    Culture (A)  Final    STAPHYLOCOCCUS AUREUS SUSCEPTIBILITIES PERFORMED ON PREVIOUS CULTURE WITHIN THE LAST 5 DAYS. Performed at [facility], [place] [address], [place], [place] [address]    Report Status 04/01/2020 FINAL  Final  Urine culture     Status: Abnormal   Collection Time: 03/29/20 11:04 AM   Specimen: Urine, Random  Result Value Ref Range Status   Specimen Description   Final    URINE, RANDOM Performed at [facility], [address] [place], [place] [address]    Special Requests   Final    NONE Performed at [facility], [place], [place] [address]    Culture (A)  Final    >=100,000 COLONIES/mL METHICILLIN RESISTANT STAPHYLOCOCCUS AUREUS   Report Status 03/31/2020 FINAL  Final   Organism ID, Bacteria METHICILLIN RESISTANT STAPHYLOCOCCUS AUREUS (A)  Final      Susceptibility   Methicillin resistant staphylococcus aureus - MIC*    CIPROFLOXACIN >=8 RESISTANT Resistant     GENTAMICIN <=0.5 SENSITIVE Sensitive     NITROFURANTOIN <=16 SENSITIVE Sensitive     OXACILLIN >=4 RESISTANT Resistant     TETRACYCLINE <=1 SENSITIVE Sensitive     VANCOMYCIN <=0.5 SENSITIVE Sensitive      TRIMETH/SULFA <=10 SENSITIVE Sensitive     CLINDAMYCIN <=0.25 SENSITIVE Sensitive     RIFAMPIN <=0.5 SENSITIVE Sensitive     Inducible Clindamycin NEGATIVE Sensitive     * >=100,000 COLONIES/mL METHICILLIN RESISTANT STAPHYLOCOCCUS AUREUS  Blood Culture ID Panel (Reflexed)     Status: Abnormal   Collection Time: 03/29/20 11:04 AM  Result Value Ref Range Status   Enterococcus species NOT DETECTED NOT DETECTED Final   Listeria monocytogenes NOT DETECTED NOT DETECTED Final   Staphylococcus species DETECTED (A) NOT DETECTED Final    Comment: CRITICAL RESULT CALLED TO, READ BACK BY AND VERIFIED WITH: JASON ROBBINS 03/29/20 AT 2135 BY ACR    Staphylococcus aureus (BCID) DETECTED (A) NOT DETECTED Final    Comment: Methicillin (oxacillin)-resistant Staphylococcus aureus (MRSA). MRSA is predictably resistant to beta-lactam antibiotics (except ceftaroline). Preferred therapy is vancomycin unless clinically contraindicated. Patient requires contact precautions if  hospitalized. CRITICAL RESULT CALLED TO, READ BACK BY AND VERIFIED WITH: JASON ROBBINS 03/29/20 AT 2135 BY ACR    Methicillin resistance DETECTED (A) NOT DETECTED Final    Comment: CRITICAL RESULT CALLED TO, READ BACK BY AND VERIFIED WITH: JASON ROBBINS 03/29/20 AT 2135 BY ACR    Streptococcus species NOT DETECTED NOT DETECTED Final   Streptococcus agalactiae NOT DETECTED NOT DETECTED Final   Streptococcus pneumoniae NOT DETECTED NOT DETECTED Final   Streptococcus pyogenes NOT DETECTED NOT DETECTED  Final   Acinetobacter baumannii NOT DETECTED NOT DETECTED Final   Enterobacteriaceae species NOT DETECTED NOT DETECTED Final   Enterobacter cloacae complex NOT DETECTED NOT DETECTED Final   Escherichia coli NOT DETECTED NOT DETECTED Final   Klebsiella oxytoca NOT DETECTED NOT DETECTED Final   Klebsiella pneumoniae NOT DETECTED NOT DETECTED Final   Proteus species NOT DETECTED NOT DETECTED Final   Serratia marcescens NOT DETECTED NOT  DETECTED Final   Haemophilus influenzae NOT DETECTED NOT DETECTED Final   Neisseria meningitidis NOT DETECTED NOT DETECTED Final   Pseudomonas aeruginosa NOT DETECTED NOT DETECTED Final   Candida albicans NOT DETECTED NOT DETECTED Final   Candida glabrata NOT DETECTED NOT DETECTED Final   Candida krusei NOT DETECTED NOT DETECTED Final   Candida parapsilosis NOT DETECTED NOT DETECTED Final   Candida tropicalis NOT DETECTED NOT DETECTED Final    Comment: Performed at [facility], [address] [place], [place] [address]  SARS Coronavirus 2 by RT PCR (hospital order, performed in [place] hospital lab) Nasopharyngeal Nasopharyngeal Swab     Status: None   Collection Time: 03/29/20 12:32 PM   Specimen: Nasopharyngeal Swab  Result Value Ref Range Status   SARS Coronavirus 2 NEGATIVE NEGATIVE Final    Comment: (NOTE) SARS-CoV-2 target nucleic acids are NOT DETECTED.  The SARS-CoV-2 RNA is generally detectable in upper and lower respiratory specimens during the acute phase of infection. The lowest concentration of SARS-CoV-2 viral copies this assay can detect is 250 copies / mL. A negative result does not preclude SARS-CoV-2 infection and should not be used as the sole basis for treatment or other patient management decisions.  A negative result may occur with improper specimen collection / handling, submission of specimen other than nasopharyngeal swab, presence of viral mutation(s) within the areas targeted by this assay, and inadequate number of viral copies (<250 copies / mL). A negative result must be combined with clinical observations, patient history, and epidemiological information.  Fact Sheet for Patients:   StrictlyIdeas.no  Fact Sheet for Healthcare Providers: BankingDealers.co.za  This test is not yet approved or  cleared by the [place] FDA and has been authorized for detection and/or diagnosis of  SARS-CoV-2 by FDA under an Emergency Use Authorization (EUA).  This EUA will remain in effect (meaning this test can be used) for the duration of the COVID-19 declaration under Section 564(b)(1) of the Act, 21 U.S.C. section 360bbb-3(b)(1), unless the authorization is terminated or revoked sooner.  Performed at [facility], [address] [place], [place] [address]   Aerobic/Anaerobic Culture (surgical/deep wound)     Status: None (Preliminary result)   Collection Time: 03/29/20  6:25 PM   Specimen: Kidney; Urine  Result Value Ref Range Status   Specimen Description   Final    KIDNEY Performed at [facility], [address], [place], [place] [address]    Special Requests   Final    Normal Performed at [facility], [place], [place] [address]    Gram Stain   Final    ABUNDANT WBC PRESENT, PREDOMINANTLY PMN ABUNDANT GRAM POSITIVE COCCI IN PAIRS IN CLUSTERS Performed at [facility], [place] [address], [place], [place] [address]    Culture   Final    ABUNDANT METHICILLIN RESISTANT STAPHYLOCOCCUS AUREUS NO ANAEROBES ISOLATED; CULTURE IN PROGRESS FOR 5 DAYS    Report Status PENDING  Incomplete   Organism ID, Bacteria METHICILLIN RESISTANT STAPHYLOCOCCUS AUREUS  Final  Susceptibility   Methicillin resistant staphylococcus aureus - MIC*    CIPROFLOXACIN >=8 RESISTANT Resistant     ERYTHROMYCIN 0.5 SENSITIVE Sensitive     GENTAMICIN <=0.5 SENSITIVE Sensitive     OXACILLIN >=4 RESISTANT Resistant     TETRACYCLINE <=1 SENSITIVE Sensitive     VANCOMYCIN <=0.5 SENSITIVE Sensitive     TRIMETH/SULFA <=10 SENSITIVE Sensitive     CLINDAMYCIN <=0.25 SENSITIVE Sensitive     RIFAMPIN <=0.5 SENSITIVE Sensitive     Inducible Clindamycin NEGATIVE Sensitive     * ABUNDANT METHICILLIN RESISTANT STAPHYLOCOCCUS AUREUS  MRSA PCR Screening     Status: Abnormal   Collection Time: 03/29/20  6:25 PM   Specimen: Nasal Mucosa; Nasopharyngeal   Result Value Ref Range Status   MRSA by PCR POSITIVE (A) NEGATIVE Final    Comment:        The GeneXpert MRSA Assay (FDA approved for NASAL specimens only), is one component of a comprehensive MRSA colonization surveillance program. It is not intended to diagnose MRSA infection nor to guide or monitor treatment for MRSA infections. RESULT CALLED TO, READ BACK BY AND VERIFIED WITH: RASHELLE RICHARDS 03/29/20 AT 1955 BY SL Performed at [facility], [address] [place], [place] [address]   CULTURE, BLOOD (ROUTINE X 2) w Reflex to ID Panel     Status: None (Preliminary result)   Collection Time: 04/01/20  7:37 AM   Specimen: BLOOD  Result Value Ref Range Status   Specimen Description BLOOD LEFT ASSIST CONTROL  Final   Special Requests   Final    BOTTLES DRAWN AEROBIC AND ANAEROBIC Blood Culture adequate volume   Culture   Final    NO GROWTH 2 DAYS Performed at [facility], [address], [place], [place] [address]    Report Status PENDING  Incomplete  CULTURE, BLOOD (ROUTINE X 2) w Reflex to ID Panel     Status: None (Preliminary result)   Collection Time: 04/01/20  7:51 AM   Specimen: BLOOD  Result Value Ref Range Status   Specimen Description BLOOD LEFT HAND  Final   Special Requests   Final    BOTTLES DRAWN AEROBIC AND ANAEROBIC Blood Culture adequate volume   Culture   Final    NO GROWTH 2 DAYS Performed at [facility], [address] [place], [place] [address]    Report Status PENDING  Incomplete  SARS Coronavirus 2 by RT PCR (hospital order, performed in [place] hospital lab) Nasopharyngeal Nasopharyngeal Swab     Status: None   Collection Time: 04/03/20 12:53 PM   Specimen: Nasopharyngeal Swab  Result Value Ref Range Status   SARS Coronavirus 2 NEGATIVE NEGATIVE Final    Comment: (NOTE) SARS-CoV-2 target nucleic acids are NOT DETECTED.  The SARS-CoV-2 RNA is generally detectable in upper and lower respiratory specimens  during the acute phase of infection. The lowest concentration of SARS-CoV-2 viral copies this assay can detect is 250 copies / mL. A negative result does not preclude SARS-CoV-2 infection and should not be used as the sole basis for treatment or other patient management decisions.  A negative result may occur with improper specimen collection / handling, submission of specimen other than nasopharyngeal swab, presence of viral mutation(s) within the areas targeted by this assay, and inadequate number of viral copies (<250 copies / mL). A negative result must be combined with clinical observations, patient history, and epidemiological information.  Fact Sheet for Patients:   StrictlyIdeas.no  Fact Sheet for  Healthcare Providers: BankingDealers.co.za  This test is not yet approved or  cleared by the [place] and has been authorized for detection and/or diagnosis of SARS-CoV-2 by FDA under an Emergency Use Authorization (EUA).  This EUA will remain in effect (meaning this test can be used) for the duration of the COVID-19 declaration under Section 564(b)(1) of the Act, 21 U.S.C. section 360bbb-3(b)(1), unless the authorization is terminated or revoked sooner.  Performed at [facility], [address] [place], [place] [address]     RADIOLOGY:  ECHO TEE  Result Date: 04/02/2020    TRANSESOPHOGEAL ECHO REPORT   Patient Name:   [facility] Date of Exam: 04/02/2020 Medical Rec #:  [id number]    Height:       62.0 in Accession #:    [id number]   Weight:       151.9 lb Date of Birth:  August 09, 1925    BSA:          1.701 m Patient Age:    28 years     BP:           149/75 mmHg Patient Gender: F            HR:           86 bpm. Exam Location:  [facility] Procedure: Transesophageal Echo, Cardiac Doppler and Color Doppler Indications:     Not listed on TEE order  History:         Patient has prior history of Echocardiogram examinations, most                   recent 03/31/2020. CHF, COPD; Risk Factors:Hypertension.  Sonographer:     Sherrie Sport RDCS (AE) Referring Phys:  [place] Diagnosing Phys: Serafina Royals MD PROCEDURE: The transesophogeal probe was passed without difficulty through the esophogus of the patient. Sedation performed by performing physician. The patient developed no complications during the procedure. IMPRESSIONS  1. Left ventricular ejection fraction, by estimation, is 55 to 60%. The left ventricle has normal function. The left ventricle has no regional wall motion abnormalities.  2. Right ventricular systolic function is normal. The right ventricular size is normal.  3. No left atrial/left atrial appendage thrombus was detected.  4. The mitral valve is normal in structure. Mild mitral valve regurgitation.  5. The aortic valve is normal in structure. Aortic valve regurgitation is trivial.  6. Evidence of atrial level shunting detected by color flow Doppler. Agitated saline contrast bubble study was positive with shunting observed within 3-6 cardiac cycles suggestive of interatrial shunt. There is a small patent foramen ovale. FINDINGS  Left Ventricle: Left ventricular ejection fraction, by estimation, is 55 to 60%. The left ventricle has normal function. The left ventricle has no regional wall motion abnormalities. The left ventricular internal cavity size was normal in size. There is  no left ventricular hypertrophy. Right Ventricle: The right ventricular size is normal. No increase in right ventricular wall thickness. Right ventricular systolic function is normal. Left Atrium: Left atrial size was normal in size. No left atrial/left atrial appendage thrombus was detected. Right Atrium: Right atrial size was normal in size. Pericardium: There is no evidence of pericardial effusion. Mitral Valve: The mitral valve is normal in structure. Mild mitral valve regurgitation. There is no evidence of mitral valve vegetation.  Tricuspid Valve: The tricuspid valve is normal in structure. Tricuspid valve regurgitation is trivial. There is no evidence of tricuspid valve vegetation. Aortic Valve: The aortic  valve is normal in structure. Aortic valve regurgitation is trivial. There is no evidence of aortic valve vegetation. Pulmonic Valve: The pulmonic valve was normal in structure. Pulmonic valve regurgitation is not visualized. Aorta: The aortic root and ascending aorta are structurally normal, with no evidence of dilitation. IAS/Shunts: The interatrial septum is aneurysmal. Evidence of atrial level shunting detected by color flow Doppler. Agitated saline contrast was given intravenously to evaluate for intracardiac shunting. Agitated saline contrast bubble study was positive  with shunting observed within 3-6 cardiac cycles suggestive of interatrial shunt. A small patent foramen ovale is detected. Serafina Royals MD Electronically signed by Serafina Royals MD Signature Date/Time: 04/02/2020/12:47:13 PM    Final    [place] EKG SITE RITE  Result Date: 04/03/2020 If Site Rite image not attached, placement could not be confirmed due to current cardiac rhythm.  Management plans discussed with the patient, family and they are in agreement.  CODE STATUS:     Code Status Orders  (From admission, onward)         Start     Ordered   03/29/20 1845  Do not attempt resuscitation (DNR)  Continuous       Question Answer Comment  In the event of cardiac or respiratory ARREST Do not call a "code blue"   In the event of cardiac or respiratory ARREST Do not perform Intubation, CPR, defibrillation or ACLS   In the event of cardiac or respiratory ARREST Use medication by any route, position, wound care, and other measures to relive pain and suffering. May use oxygen, suction and manual treatment of airway obstruction as needed for comfort.      03/29/20 1844        Code Status History    Date Active Date Inactive Code Status Order ID  Comments User Context   03/29/2020 1545 03/29/2020 1844 Full Code [id number]  Flora Lipps, MD ED   07/06/2015 2059 07/07/2015 1725 Full Code [id number]  Hower, Aaron Mose, MD ED   Advance Care Planning Activity      TOTAL TIME TAKING CARE OF THIS PATIENT: 35 minutes.    Loletha Grayer M.D on 04/03/2020 at 3:11 PM  Between 7am to 6pm - Pager - [phone]  After 6pm go to [email or web address] - password [email or web address]  [facility]  CC: Primary care physician; Patient, No Pcp Per

## 2020-04-03 NOTE — Progress Notes (Signed)
Physical Therapy Treatment Patient Details Name: Brittney Wood MRN: [id number] DOB: [birth year] Today's Date: 04/03/2020    History of Present Illness 86 y.o. female who presented to the [place] via EMS after her caregiver found her with AMS this morning.  CT abd/pel today revealed an obstructing 8 mm right UPJ stone associated with right sided hydronephrosis. PMH includes: dementia    PT Comments    Patient received in bed, finishing lunch. Patient agreeable to PT. She requires mod assist for supine>< sit and min assist for sit to stand. Patient able to take a few side steps with RW and min assist. Performed STS x 4, performed seated and supine LE exercises. She will continue to benefit from skilled PT while here to improve strength and functional independence.     Follow Up Recommendations  SNF;Supervision for mobility/OOB     Equipment Recommendations  None recommended by PT    Recommendations for Other Services       Precautions / Restrictions Precautions Precautions: Fall Restrictions Weight Bearing Restrictions: No    Mobility  Bed Mobility Overal bed mobility: Needs Assistance Bed Mobility: Supine to Sit;Sit to Supine     Supine to sit: Min assist;HOB elevated Sit to supine: Min assist;HOB elevated   General bed mobility comments: Patient requires assist to raise shoulders from bed. Patient also needs assistance bringing legs back up onto bed and with positioning.  Transfers Overall transfer level: Needs assistance Equipment used: Rolling walker (2 wheeled) Transfers: Sit to/from Stand Sit to Stand: Min assist         General transfer comment: Patient requires assist to boost up. Performed sit to stand x 4 reps  Ambulation/Gait Ambulation/Gait assistance: Min guard Gait Distance (Feet): 4 Feet Assistive device: Rolling walker (2 wheeled) Gait Pattern/deviations: Step-to pattern;Decreased stride length Gait velocity: decr   General Gait Details:  patient able to side step along edge of bed with min assist and RW.   Stairs             Wheelchair Mobility    Modified Rankin (Stroke Patients Only)       Balance Overall balance assessment: Needs assistance Sitting-balance support: Feet supported Sitting balance-Leahy Scale: Fair Sitting balance - Comments: able to maintain steady static sitting balance, increasing posterior lean with fatigue.   Standing balance support: Bilateral upper extremity supported;During functional activity Standing balance-Leahy Scale: Fair Standing balance comment: Reliant on RW, but steady with static standing, needs assistance with dynamic standing                            Cognition Arousal/Alertness: Awake/alert Behavior During Therapy: WFL for tasks assessed/performed Overall Cognitive Status: History of cognitive impairments - at baseline                                        Exercises Other Exercises Other Exercises: Sit to stand x 4 reps, Seated LAQ, marching, supine SLR and AP x 10 reps each. patient fatigued with seated exercises    General Comments        Pertinent Vitals/Pain Pain Assessment: Faces Faces Pain Scale: Hurts a little bit Pain Location: B shoulders, back (due to OA) Pain Descriptors / Indicators: Discomfort;Grimacing Pain Intervention(s): Monitored during session;Repositioned    Home Living  Prior Function            PT Goals (current goals can now be found in the care plan section) Acute Rehab PT Goals Patient Stated Goal: "I want to go home" PT Goal Formulation: With patient Time For Goal Achievement: 04/08/20 Potential to Achieve Goals: Fair Progress towards PT goals: Progressing toward goals    Frequency    Min 2X/week      PT Plan Current plan remains appropriate    Co-evaluation              AM-PAC PT "6 Clicks" Mobility   Outcome Measure  Help needed turning from  your back to your side while in a flat bed without using bedrails?: A Little Help needed moving from lying on your back to sitting on the side of a flat bed without using bedrails?: A Little Help needed moving to and from a bed to a chair (including a wheelchair)?: A Little Help needed standing up from a chair using your arms (e.g., wheelchair or bedside chair)?: A Little Help needed to walk in hospital room?: A Lot Help needed climbing 3-5 steps with a railing? : A Lot 6 Click Score: 16    End of Session Equipment Utilized During Treatment: Gait belt Activity Tolerance: Patient tolerated treatment well Patient left: in bed;with bed alarm set;with call bell/phone within reach Nurse Communication: Mobility status PT Visit Diagnosis: Other abnormalities of gait and mobility (R26.89);Muscle weakness (generalized) (M62.81);Difficulty in walking, not elsewhere classified (R26.2)     Time: 1430-1450 PT Time Calculation (min) (ACUTE ONLY): 20 min  Charges:  $Therapeutic Exercise: 8-22 mins                     Delanie Tirrell, PT, GCS 04/03/20,3:20 PM

## 2020-04-03 NOTE — Progress Notes (Signed)
[facility] for Electrolyte Monitoring and Replacement   Recent Labs: Potassium (mmol/L)  Date Value  04/03/2020 3.9  01/03/2015 3.9   Magnesium (mg/dL)  Date Value  04/03/2020 1.8   Calcium (mg/dL)  Date Value  04/03/2020 8.0 (L)   Calcium, Total (mg/dL)  Date Value  01/03/2015 8.2 (L)   Albumin (g/dL)  Date Value  04/03/2020 2.2 (L)  01/18/2014 2.9 (L)   Phosphorus (mg/dL)  Date Value  04/03/2020 2.9   Sodium (mmol/L)  Date Value  04/03/2020 139  01/03/2015 134 (L)   Corrected Ca: 9.44 mg/dL  Assessment: 86 year old female withhypertension, COPD, HFpEF, GERD, who presents with septic shock secondary to infectedright UPJ stone. Pt is s/p of right nephrostomy tube placement by IR now with MRSA bacteremia.   Goal of Therapy:  Potassium 4.0 - 5.1 mmol/L Magnesium 2.0 - 2.4 mg/dL All Other Electrolytes WNL  Plan:   Repeat oral KCl 20 mEq x 1  Repeat magnesium sulfate 2 grams IV x 1  Follow up electrolytes with morning labs 7/22  Brittney Wood ,PharmD Clinical Pharmacist 04/03/2020 6:56 AM

## 2020-04-03 NOTE — TOC Transition Note (Signed)
Transition of Care [facility]) - CM/SW Discharge Note   Patient Details  Name: Brittney Wood MRN: [id number] Date of Birth: September 15, 1924  [facility] CM/SW Contact:  Candie Chroman, LCSW Phone Number: 04/03/2020, 3:50 PM   Clinical Narrative: Patient has orders to discharge to [facility] today. RN has already called report. EMS has been arranged. No further concerns. CSW signing off.    Final next level of care: Skilled Nursing Facility Barriers to Discharge: Barriers Resolved   Patient Goals and CMS Choice Patient states their goals for this hospitalization and ongoing recovery are:: Patient not fully oriented.   Choice offered to / list presented to : Adult Children  Discharge Placement   Existing PASRR number confirmed : 04/02/20          Patient chooses bed at: [facility] Patient to be transferred to facility by: EMS Name of family member notified: Fredirick Maudlin Patient and family notified of of transfer: 04/03/20  Discharge Plan and Services     Post Acute Care Choice: [facility]                               Social Determinants of Health (SDOH) Interventions     Readmission Risk Interventions No flowsheet data found.

## 2020-04-03 NOTE — Plan of Care (Signed)
Discharge order received. Patient mental status is at baseline. Vital signs stable . No signs of acute distress. Discharge instructions given to [facility] Staff nurse at [facility]. No other issues noted at this time.

## 2020-04-03 NOTE — Progress Notes (Signed)
Pharmacy Antibiotic Note  Brittney Wood is a 86 y.o. female admitted on 03/29/2020. Patient with 8 mm right UPJ stone with upstream hydronephrosis and additional 9 mm right lower pole stone burden. Currently with MRSA bacteremia as well as urine culture with MRSA, ID following. Pharmacy has been consulted for vancomycin dosing. This is day # 6 of IV vancomycin with a planned duration of at least 4 weeks. A vancomycin level was drawn this morning:  04/03/20 0218: vancomycin level 48 mcg/mL  This level was drawn while the vancomycin dose was being infused and therefore unable to be accurately interpreted.  vancomycin plan:   Continue vancomycin to 1000 mg q24h for improvement in renal function  Goal vancomycin trough: 15 - 20 mcg/mL  Re-draw vancomycin level prior to tomorrow morning's dose if not discharged  Patient is scheduled for discharge to complete antibiotic treatment: end date 04/27/20  Vancomycin trough ordered for outpatient infusion center 7/22  Re-check SCr in am to assess renal function if patient is not discharged  Height: 5\' 2"  (157.5 cm) Weight: 68.9 kg (151 lb 14.4 oz) IBW/kg (Calculated) : 50.1  Temp (24hrs), Avg:98.1 F (36.7 C), Min:97.7 F (36.5 C), Max:98.4 F (36.9 C)  Recent Labs  Lab 03/29/20 1104 03/29/20 1104 03/29/20 1232 03/30/20 0521 03/31/20 0329 04/01/20 0507 04/02/20 0418 04/03/20 0218  WBC 15.3*  --   --  7.3  --   --   --   --   CREATININE 1.40*   < >  --  1.13* 0.92 0.75 0.66 0.70  LATICACIDVEN 2.6*  --  2.9*  --   --   --   --   --   VANCORANDOM  --   --   --   --   --   --   --  48   < > = values in this interval not displayed.    Estimated Creatinine Clearance: 40.8 mL/min (by C-G formula based on SCr of 0.7 mg/dL).    Allergies  Allergen Reactions  . Buprenorphine Hcl     Other reaction(s): Other (See Comments) Hallucinations  . Morphine And Related Other (See Comments)    Hallucinations    Antimicrobials this  admission: Ceftriaxone 7/16 >> 7/17 Vancomycin 7/16 >>  Dose adjustments this admission: 7/18 Vanc 750 mg q24h >> 1000 mg q24h  Microbiology results: 7/16 BCx: MRSA 7/16 UCx: MRSA  7/16 MRSA PCR: positive  Thank you for allowing pharmacy to be a part of this patient's care.  Dallie Piles 04/03/2020 6:56 AM

## 2020-04-03 NOTE — Progress Notes (Signed)
awaiting on EMS to transport patient.

## 2020-04-03 NOTE — Care Management Important Message (Signed)
Important Message  Patient Details  Name: Brittney Wood MRN: [id number] Date of Birth: August 05, 1925   Medicare Important Message Given:  Yes  Reviewed with daughter, Brittney Wood.  Copy of Medicare IM sent securely to email address provided: [email or web address].    Dannette Barbara 04/03/2020, 2:45 PM

## 2020-04-03 NOTE — Progress Notes (Signed)
Attempted to contact Daughter Enid Derry for PICC consent but no answer.Will try again later.

## 2020-04-03 NOTE — Treatment Plan (Signed)
Diagnosis: MRSA bacteremia/ MRSA urosepsis/Rt hydronephrosis/ PUJ stone Baseline Creatinine 0.7   Allergies  Allergen Reactions  . Buprenorphine Hcl     Other reaction(s): Other (See Comments) Hallucinations  . Morphine And Related Other (See Comments)    Hallucinations    OPAT Orders Discharge antibiotics: Vancomycin 1 gram IV infusion every 24 hours Please consult your pharmacist to adjust the dose Per pharmacy protocol  Aim for Vancomycin trough 15-20  End Date:04/27/20   [facility] Care Per Protocol:  Labs weekly on Monday while on IV antibiotics: _X_ CBC with differential _X CMP  _X_ Vancomycin trough  Labs weekly on Thursday while on antibiotics  X BMP  X Vancomycin trough  _X_ Please pull PIC at completion of IV antibiotics  Fax weekly labs to [place] [phone]  Clinic Follow Up Appt:3 weeks  Call [phone] to make appt

## 2020-04-04 LAB — AEROBIC/ANAEROBIC CULTURE W GRAM STAIN (SURGICAL/DEEP WOUND): Special Requests: NORMAL

## 2020-04-05 ENCOUNTER — Other Ambulatory Visit: Payer: Self-pay | Admitting: Radiology

## 2020-04-05 DIAGNOSIS — N200 Calculus of kidney: Secondary | ICD-10-CM

## 2020-04-06 LAB — CULTURE, BLOOD (ROUTINE X 2)
Culture: NO GROWTH
Culture: NO GROWTH
Special Requests: ADEQUATE
Special Requests: ADEQUATE

## 2020-04-09 ENCOUNTER — Other Ambulatory Visit
Admission: RE | Admit: 2020-04-09 | Discharge: 2020-04-09 | Disposition: A | Discharge status: Home / Self Care | Payer: No Typology Code available for payment source | Attending: Urology | Admitting: Urology

## 2020-04-09 ENCOUNTER — Ambulatory Visit: Payer: Self-pay | Admitting: Urology

## 2020-04-09 ENCOUNTER — Other Ambulatory Visit: Payer: Self-pay | Admitting: *Deleted

## 2020-04-09 ENCOUNTER — Other Ambulatory Visit: Payer: Self-pay

## 2020-04-09 ENCOUNTER — Encounter: Payer: Self-pay | Admitting: Urology

## 2020-04-09 ENCOUNTER — Ambulatory Visit (INDEPENDENT_AMBULATORY_CARE_PROVIDER_SITE_OTHER): Payer: Medicare Other | Admitting: Urology

## 2020-04-09 VITALS — BP 161/72 | HR 68 | Ht 62.0 in | Wt 151.0 lb

## 2020-04-09 DIAGNOSIS — N201 Calculus of ureter: Secondary | ICD-10-CM | POA: Insufficient documentation

## 2020-04-09 DIAGNOSIS — N200 Calculus of kidney: Secondary | ICD-10-CM | POA: Diagnosis present

## 2020-04-09 DIAGNOSIS — R6521 Severe sepsis with septic shock: Secondary | ICD-10-CM

## 2020-04-09 DIAGNOSIS — A4102 Sepsis due to Methicillin resistant Staphylococcus aureus: Secondary | ICD-10-CM

## 2020-04-09 LAB — URINALYSIS, COMPLETE (UACMP) WITH MICROSCOPIC
Bilirubin Urine: NEGATIVE
Glucose, UA: NEGATIVE mg/dL
Ketones, ur: NEGATIVE mg/dL
Nitrite: NEGATIVE
Protein, ur: >300 mg/dL — AB
RBC / HPF: >50 RBC/hpf (ref 0–5)
Specific Gravity, Urine: 1.025 (ref 1.005–1.030)
Squamous Epithelial / HPF: NONE SEEN (ref 0–5)
WBC, UA: >50 WBC/hpf (ref 0–5)
pH: 6.5 (ref 5.0–8.0)

## 2020-04-09 NOTE — Progress Notes (Signed)
   04/09/2020 10:09 AM   Brittney Wood [birth year] [id number]  Reason for visit: Follow up right ureteral stone, urosepsis  HPI: I saw Brittney Wood and her daughter in urology clinic for follow-up after her recent hospitalization for right ureteral stone and MRSA urosepsis with placement of a nephrostomy tube.  She is a 86 year old female with dementia who was admitted with sepsis on 03/29/2020 and found to have a 1 cm right ureteral stone with upstream hydronephrosis, and was seen by my partner Dr. Lovena Neighbours who recommended a right-sided nephrostomy tube for drainage.  She continues on vancomycin IV antibiotics for MRSA bacteremia that is set to continue through 04/27/2020 per the infectious disease notes.  The patient has no complaints today aside from her chronic arthritis.  The majority of the conversation is with the patient's daughter, as the patient does have severe dementia.  We discussed treatment options at length including maintaining her nephrostomy tube with changes that would be required every 8 weeks, and the risks of the tube being pulled out, pain, and recurrent infection, versus pursuing definitive management with ureteroscopy, laser lithotripsy, and stent placement.  I strongly recommended ureteroscopy for management of her stones and removal of her nephrostomy tube.  They are both in agreement with this.  We discussed at length a risks of surgery including bleeding, infection, stent related symptoms, need for staged procedure and inability to access the stone, and other more serious complications like PE, MI, and death with her advanced age and comorbidities.  We will send urine for culture today to confirm negative, plan right ureteroscopy, laser lithotripsy, and stent placement as scheduled for next Friday 8/6, and we will plan to remove her stent the next week while she is still on IV antibiotics through the 14th.   I spent 45 total minutes on the day of the encounter including  pre-visit review of the medical record, face-to-face time with the patient, and post visit ordering of labs/imaging/tests.  Billey Co, [facility] [address], [place], [place] [address] [phone]

## 2020-04-11 LAB — URINE CULTURE: Culture: NO GROWTH

## 2020-04-15 ENCOUNTER — Encounter
Admission: RE | Admit: 2020-04-15 | Discharge: 2020-04-15 | Disposition: A | Discharge status: Home / Self Care | Payer: Medicare Other | Source: Ambulatory Visit | Attending: Urology | Admitting: Urology

## 2020-04-15 DIAGNOSIS — Z01812 Encounter for preprocedural laboratory examination: Secondary | ICD-10-CM | POA: Insufficient documentation

## 2020-04-15 HISTORY — DX: Encounter for adjustment and management of vascular access device: Z45.2

## 2020-04-15 HISTORY — DX: Patent foramen ovale: Q21.12

## 2020-04-15 HISTORY — DX: Family history of other specified conditions: Z84.89

## 2020-04-15 HISTORY — DX: Atrial septal defect: Q21.1

## 2020-04-15 NOTE — Patient Instructions (Addendum)
Your procedure is scheduled on: 04-19-20 FRIDAY Report to Same Day Surgery 2nd floor medical mall [facility] Entrance-take elevator on left to 2nd floor.  Check in with surgery information desk.) To find out your arrival time please call [phone] between 1PM - 3PM on 04-18-20 THURSDAY  Remember: Instructions that are not followed completely may result in serious medical risk, up to and including death, or upon the discretion of your surgeon and anesthesiologist your surgery may need to be rescheduled.    _x___ 1. Do not eat food after midnight the night before your procedure. NO GUM OR CANDY AFTER MIDNIGHT. You may drink clear liquids up to 2 hours before you are scheduled to arrive at the hospital for your procedure.  Do not drink clear liquids within 2 hours of your scheduled arrival to the hospital.  Clear liquids include  --Water or Apple juice without pulp  --Gatorade  --Black Coffee or Clear Tea (No milk, no creamers, do not add anything to the coffee or Tea-OK ADD SUGAR)     __x__ 2. No Alcohol for 24 hours before or after surgery.   __x__3. No Smoking or e-cigarettes for 24 prior to surgery.  Do not use any chewable tobacco products for at least 6 hour prior to surgery   ____  4. Bring all medications with you on the day of surgery if instructed.    __x__ 5. Notify your doctor if there is any change in your medical condition     (cold, fever, infections).    x___6. On the morning of surgery brush your teeth with toothpaste and water.  You may rinse your mouth with mouth wash if you wish.  Do not swallow any toothpaste or mouthwash.   Do not wear jewelry, make-up, hairpins, clips or nail polish.  Do not wear lotions, powders, or perfumes.   Do not shave 48 hours prior to surgery. Men may shave face and neck.  Do not bring valuables to the hospital.    [facility] is not responsible for any belongings or valuables.               Contacts, dentures or bridgework may not be  worn into surgery.  Leave your suitcase in the car. After surgery it may be brought to your room.  For patients admitted to the hospital, discharge time is determined by your treatment team.  _  Patients discharged the day of surgery will not be allowed to drive home.  You will need someone to drive you home and stay with you the night of your procedure.    Please read over the following fact sheets that you were given:   [facility] Preparing for Surgery   _x___ TAKE THE FOLLOWING MEDICATION THE MORNING OF SURGERY WITH A SMALL SIP OF WATER. These include:  1. NAMENDA (MEMANTINE)  2. SINGULAIR ([place]AST)  3. PRILOSEC (OMEPRAZOLE)  4.  5.  6.  ____Fleets enema or Magnesium Citrate as directed.   _x___ Use CHG wipes as directed on instruction sheet   ____ Use inhalers on the day of surgery and bring to hospital day of surgery  ____ Stop Metformin and Janumet 2 days prior to surgery.    ____ Take 1/2 of usual insulin dose the night before surgery and none on the morning surgery.   _x___ Follow recommendations from Cardiologist, Pulmonologist or PCP regarding stopping Aspirin, Coumadin, Plavix ,Eliquis, Effient, or Pradaxa, and Pletal-CONTINUE ASPIRIN PER DR SNINKSY-DO NOT TAKE ASPIRIN MORNING OF SURGERY  X____Stop Anti-inflammatories such as Advil, Aleve, Ibuprofen, Motrin, Naproxen, Naprosyn, Goodies powders or aspirin products NOW- OK to take Tylenol    ____ Stop supplements until after surgery.    ____ Bring C-Pap to the hospital.

## 2020-04-16 NOTE — Progress Notes (Addendum)
[facility] Perioperative Services  Pre-Admission/Anesthesia Testing Clinical Review  Date: 04/18/20  Patient Demographics:  Name: Brittney Wood DOB:   03/05/1925 MRN:   [id number]  Planned Surgical Procedure(s):    Case: [id number] Date/Time: 04/19/20 1005   Procedures:      CYSTOSCOPY/URETEROSCOPY/HOLMIUM LASER/STENT PLACEMENT (Right )     NEPHROSTOMY TUBE REMOVAL (Right )   Anesthesia type: General   Pre-op diagnosis: right ureteral stone   Location: [facility] OR ROOM 10 / [facility]   Surgeons: Billey Co, MD     NOTE: Available PAT nursing documentation and vital signs have been reviewed. Clinical nursing staff has updated patient's PMH/PSHx, current medication list, and drug allergies/intolerances to ensure comprehensive history available to assist in medical decision making as it pertains to the aforementioned surgical procedure and anticipated anesthetic course.   Clinical Discussion:  Brittney Wood is a 86 y.o. female who is submitted for pre-surgical anesthesia review and clearance prior to her undergoing the above procedure. Patient has never been a smoker. Pertinent PMH includes: chronic diastolic CHF (NYHA class 3), PFO, HTN, COPD, idiopathic pulmonary fibrosis, severe sepsis due to MRSA (currently has PICC line), nephrolithiasis (RIGHT percutaneous nephrostomy tube in place), dementia.  Due to patient's dementia diagnosis, patient's daughter Fredirick Maudlin) serves as the primary historian; has HCPOA.  Daughter denies the patient has never experienced any intra/postoperative anesthetic complications.  Of note, daughter reports that she herself had difficulty waking up following anesthesia in the past.   Patient was admitted to [facility]; notes reviewed. Patient presented to ED from home via EMS with AMS, tachycardia, and hypotension. WBC 15.3 K/uL and lactic acid 2.6 mmol/L. Blood and urine cultures (+) for MRSA. CT imaging of the abdomen  and pelvis reveal an 8 mm urolithiasis at the UPJ with evidence resultant hydronephrosis; other non-obstructing stones seen. Percutaneous nephrostomy tube was placed by IR. Patient subsequently admitted for urosepsis and  MRSA bacteremia from 03/29/2020 to 04/03/2020.   ID was consulted and patient started on IV vancomycin. Patient remains on vancomycin 1 gram daily until 04/27/2020 per direction of Dr. Delaine Lame. PICC line in place.    Cardiology was consulted Nehemiah Massed, MD) during her admission. She was last seen by cardiology ion 04/02/2020. EKG showed sinus rhythm with a LBBB. TTE was performed that revealed a normal LVEF of 60%. Given her MRSA bacteremia, there was concern for endocarditis.  Bedside TEE performed that revealed interatrial shunting consistent with a small PFO; LVEF 55-60%. (see full results below).  No further cardiac diagnostic testing was deemed necessary during her admission.   Given the incidental finding of the PFO and the noted on her ECG, pre-surgical clearance has been requested by PAT team. Patient will see Dr. Nehemiah Massed on 04/19/2020 at 0900. If cleared, she will proceed to [facility] same day surgery department for her planned procedure. Patient's daughter has been made aware of plans by surgeon's office. This patient is on daily antiplatelet therapy. LTCF carding for patient has been instructed on recommendations for holding her daily low dose ASA on the morning of her procedure. LTCF has been instructed that her last dose of her ASA will be on 04/18/2020.  Patient with an idiopathic pulmonary fibrosis and COPD diagnosis. She is followed by PCCM Raul Del, MD) and was last seen on 10/26/2019; notes reviewed. Overall patient was doing well; noted to be "status quo". Patient at baseline respiratory status. No recent AECOPD. Compliant with prescribed MDIs. Pulmonary clearance for planned procedure issued on  04/19/2020 by Dr. Raul Del with a LOW risk stratification.    Vitals with  BMI 04/09/2020 04/03/2020 04/03/2020  Height 5\' 2"  - -  Weight 151 lbs - -  BMI 93.23 - -  Systolic 557 322 025  Diastolic 72 72 84  Pulse 68 86 105    Providers/Specialists:   NOTE: Primary physician provider listed below. Patient may have been seen by APP or partner within same practice.   PROVIDER ROLE LAST Elmer Picker, MD Urology (Surgeon) 04/09/2020  Maryella Shivers, MD Primary Care Provider ?  Serafina Royals, MD Cardiology  04/02/2020  Tsosie Billing, MD Infectious Disease 04/03/2020  Wallene Huh, MD Pulmonology 10/26/2019   Allergies:  Buprenorphine hcl and Morphine and related  Current Home Medications:   . aspirin 81 MG EC tablet  . donepezil (ARICEPT) 10 MG tablet  . latanoprost (XALATAN) 0.005 % ophthalmic solution  . memantine (NAMENDA) 10 MG tablet  . montelukast (SINGULAIR) 10 MG tablet  . omeprazole (PRILOSEC) 20 MG capsule  . polyethylene glycol (MIRALAX / GLYCOLAX) 17 g packet  . sodium chloride flush (NS) 0.9 % SOLN  . vancomycin IVPB  . acetaminophen (TYLENOL) 650 MG CR tablet  . losartan (COZAAR) 50 MG tablet   No current facility-administered medications for this encounter.   History:   Past Medical History:  Diagnosis Date  . Cancer ([place])    skin  . CHF (congestive heart failure) ([place])   . COPD (chronic obstructive pulmonary disease) ([place])   . Family history of adverse reaction to anesthesia    DAUGHTER-HARD TO WAKE UP  . History of methicillin resistant staphylococcus aureus (MRSA) 03/2020  . Hypertension   . Patent foramen ovale    MILD PER TEE REPORT DONE ON 04-03-20  . PICC (peripherally inserted central catheter) in place    Past Surgical History:  Procedure Laterality Date  . ABDOMINAL HYSTERECTOMY    . IR NEPHROSTOMY PLACEMENT RIGHT  03/29/2020  . JOINT REPLACEMENT    . TEE WITHOUT CARDIOVERSION N/A 04/02/2020   Procedure: TRANSESOPHAGEAL ECHOCARDIOGRAM (TEE);  Surgeon: Corey Skains, MD;  Location: [facility]  ORS;  Service: Cardiovascular;  Laterality: N/A;   Family History  Problem Relation Age of Onset  . Hypertension Other    Social History   Tobacco Use  . Smoking status: Never Smoker  . Smokeless tobacco: Never Used  Substance Use Topics  . Alcohol use: No  . Drug use: No    Pertinent Clinical Results:  LABS: Labs reviewed: Acceptable for surgery.  NOTE: Labs obtained by patient's LTCF on 04/16/2020 as follows:    No visits with results within 3 Day(s) from this visit.  Latest known visit with results is:  Hospital Outpatient Visit on 04/09/2020  Component Date Value Ref Range Status  . Specimen Description 04/09/2020    Final                   Value:URINE, CATHETERIZED Performed at [facility], [address], [place], [place] [address]   . Special Requests 04/09/2020    Final                   Value:NONE Performed at [facility], [address] [place], [place] [address]   . Culture 04/09/2020    Final                   Value:NO GROWTH Performed at [facility], [place] [address]  [place], [place] [address]   . Report Status 04/09/2020 04/11/2020 FINAL   Final  . Color, Urine 04/09/2020 AMBER* YELLOW Final   BIOCHEMICALS MAY BE AFFECTED BY COLOR  . APPearance 04/09/2020 HAZY* CLEAR Final  . Specific Gravity, Urine 04/09/2020 1.025  1.005 - 1.030 Final  . pH 04/09/2020 6.5  5.0 - 8.0 Final  . Glucose, UA 04/09/2020 NEGATIVE  NEGATIVE mg/dL Final  . Hgb urine dipstick 04/09/2020 LARGE* NEGATIVE Final  . Bilirubin Urine 04/09/2020 NEGATIVE  NEGATIVE Final  . Ketones, ur 04/09/2020 NEGATIVE  NEGATIVE mg/dL Final  . Protein, ur 04/09/2020 >300* NEGATIVE mg/dL Final  . Nitrite 04/09/2020 NEGATIVE  NEGATIVE Final  . Leukocytes,Ua 04/09/2020 MODERATE* NEGATIVE Final  . Squamous Epithelial / LPF 04/09/2020 NONE SEEN  0 - 5 Final  . WBC, UA 04/09/2020 >50  0 - 5 WBC/hpf Final  . RBC / HPF 04/09/2020 >50  0 - 5 RBC/hpf Final  . Bacteria,  UA 04/09/2020 FEW* NONE SEEN Final  . WBC Clumps 04/09/2020 PRESENT   Final   Performed at [facility], [address], [place], [place] [address]    ECG: Date: 03/29/2020 Time ECG obtained: 10:48 AM Rate: 98 bpm Rhythm:  SR with LBBB Intervals: QRS 132 ms. QTc 464 ms. ST segment and T wave changes: No evidence of acute ST segment elevation or depression Comparison: When compared to ECG tracing done at [facility] on 12/13/2017 there is now a LBBB present.   IMAGING / PROCEDURES: TRANSESOPHAGEAL ECHOCARDIOGRAM (TEE) done on 04/02/2020 1. Left ventricular ejection fraction, by estimation, is 55 to 60%. The left ventricle has normal function. The left ventricle has no regional wall motion abnormalities.  2. Right ventricular systolic function is normal. The right ventricular size is normal.  3. No left atrial/left atrial appendage thrombus was detected.  4. The mitral valve is normal in structure. Mild mitral valve regurgitation.  5. The aortic valve is normal in structure. Aortic valve regurgitation is trivial.  6. Evidence of atrial level shunting detected by color flow Doppler. Agitated saline contrast bubble study was positive with shunting observed  within 3-6 cardiac cycles suggestive of interatrial shunt. There is a small patent foramen ovale.   TRANSTHORACIC ECHOCARDIOGRAM (TTE) done on 03/31/2020 1. Left ventricular ejection fraction, by estimation, is 60 to 65%. The left ventricle has normal function. The left ventricle has no regional wall motion abnormalities. Left ventricular diastolic parameters are consistent with Grade I diastolic dysfunction (impaired relaxation).  2. Right ventricular systolic function is normal. The right ventricular size is normal. There is moderately elevated PASP. 3. The mitral valve is normal in structure. Mild mitral valve regurgitation. No evidence of mitral stenosis.  4. The aortic valve is normal in structure. Aortic valve  regurgitation is not visualized. No aortic stenosis is present.  5. The inferior vena cava is normal in size with greater than 50% respiratory variability, suggesting right atrial pressure of 3 mmHg.   ULTRASOUND GUIDED NEPHROSTOMY PLACEMENT done on 03/29/2020 1. Right percutaneous nephrostomy tube placement.  2. There was return of grossly purulent urine. A sample was sent for culture analysis.  3. A 10 French nephrostomy tube was placed and attached to gravity bag drainage.  CT ABDOMEN AND PELVIS W/O CONTRAST done on 03/29/2020 1. 8 mm calculus at the right UPJ with resultant mild to moderate right hydronephrosis. 2. Additional nonobstructive right nephrolithiasis. 3. Cirrhosis with sequelae of portal hypertension including moderate splenomegaly. 4. Cholelithiasis versus sludge 5. 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 Aortic atherosclerosis and emphysema  Impression and Plan:  Aarionna Germer has been referred for pre-anesthesia review and clearance prior her undergoing the planned anesthetic and procedural courses. Available labs, pertinent testing, and imaging results were personally reviewed by me. Patient has been cleared by pulmonology. This patient is PENDING cardiac clearance at this time. She will see cardiology tomorrow at 0900. If cleared, she will proceed to SDS for her procedure. Surgeon, patient's daughter, and SDS/OR team are aware.   Based on clinical review performed today (04/18/20), barring any significant acute changes in the patient's overall condition, and she is cleared by cardiology, it is anticipated that shewill be able to proceed with the planned surgical intervention. Any acute changes in clinical condition may necessitate her procedure being postponed and/or cancelled. Pre-surgical instructions were reviewed with the patient during her PAT appointment and questions were fielded by PAT clinical staff.  Honor Loh, MSN, APRN, FNP-C, CEN [facility]    Peri-operative Services Nurse Practitioner Phone: [phone] 04/18/20 2:45 PM  NOTE: This note has been prepared using Dragon dictation software. Despite my best ability to proofread, there is always the potential that unintentional transcriptional errors may still occur from this process.

## 2020-04-18 ENCOUNTER — Other Ambulatory Visit
Admission: RE | Admit: 2020-04-18 | Discharge: 2020-04-18 | Disposition: A | Discharge status: Home / Self Care | Payer: No Typology Code available for payment source | Source: Ambulatory Visit | Attending: Urology | Admitting: Urology

## 2020-04-18 ENCOUNTER — Other Ambulatory Visit: Payer: Self-pay

## 2020-04-18 DIAGNOSIS — Z20822 Contact with and (suspected) exposure to covid-19: Secondary | ICD-10-CM | POA: Insufficient documentation

## 2020-04-18 DIAGNOSIS — Z01812 Encounter for preprocedural laboratory examination: Secondary | ICD-10-CM | POA: Diagnosis present

## 2020-04-19 LAB — SARS CORONAVIRUS 2 (TAT 6-24 HRS): SARS Coronavirus 2: NEGATIVE

## 2020-04-19 NOTE — Progress Notes (Signed)
  [facility] Perioperative Services: Pre-Admission/Anesthesia Testing     Date: 04/19/20  Name: Brittney Wood MRN:   [id number]  Re: Cardiac clearance for surgery / surgery cancellation  Patient was seen in outpatient consult this morning by cardiology Nehemiah Massed, MD). Patient with small PFO noted on TEE during recent admission, thus pre-surgical clearance was requested. Per Dr. Nehemiah Massed, patient may proceed with procedure as planned with a LOW risk stratification. He is requesting that ASA be held for 3 days prior to her procedure and resumed 1 day afterward. Copy of clearance placed on patient's chart in SDS.  With that being said, despite obtaining cardiac clearance, patient's procedure today will have to be postponed. Patient resides at a LTCF [facility]) where she was, despite verbal and written information with the staff, fed breakfast this morning. Patient ate full meal at around 0800. Patient with a dementia diagnosis and did not know to remain NPO for surgery.  I have spoken with anesthesia regarding the aforementioned. Patient will be unable to undergo GETA course for at least 8 hours after eating, which would be around 1600 this afternoon. Surgeon Diamantina Providence, MD) aware. MD asking for case to be postponed as he does not wish for patient to undergo procedure that late in the day due to her age and post-operative requirements (recovery and void prior to discharge). I have made surgical coordinator (Heidel, RN) aware that procedure will be canceled for today and need to be rescheduled.   Honor Loh, MSN, APRN, FNP-C, CEN [facility]  Peri-operative Services Nurse Practitioner Phone: [phone] 04/19/20 9:37 AM

## 2020-04-25 ENCOUNTER — Other Ambulatory Visit
Admission: RE | Admit: 2020-04-25 | Discharge: 2020-04-25 | Disposition: A | Discharge status: Home / Self Care | Payer: No Typology Code available for payment source | Source: Ambulatory Visit | Attending: Urology | Admitting: Urology

## 2020-04-25 ENCOUNTER — Other Ambulatory Visit: Payer: Self-pay

## 2020-04-25 DIAGNOSIS — Z01812 Encounter for preprocedural laboratory examination: Secondary | ICD-10-CM | POA: Diagnosis present

## 2020-04-25 DIAGNOSIS — Z20822 Contact with and (suspected) exposure to covid-19: Secondary | ICD-10-CM | POA: Insufficient documentation

## 2020-04-26 ENCOUNTER — Encounter: Payer: Self-pay | Admitting: Urology

## 2020-04-26 ENCOUNTER — Encounter
Admission: RE | Disposition: A | Discharge status: Nursing Home (Includes ICF) | Payer: Self-pay | Source: Home / Self Care | Attending: Urology

## 2020-04-26 ENCOUNTER — Ambulatory Visit: Payer: Medicare Other | Admitting: Urgent Care

## 2020-04-26 ENCOUNTER — Ambulatory Visit: Payer: Medicare Other

## 2020-04-26 ENCOUNTER — Ambulatory Visit
Admission: RE | Admit: 2020-04-26 | Discharge: 2020-04-26 | Disposition: A | Discharge status: Nursing Home (Includes ICF) | Payer: Medicare Other | Attending: Urology | Admitting: Urology

## 2020-04-26 DIAGNOSIS — I509 Heart failure, unspecified: Secondary | ICD-10-CM | POA: Diagnosis not present

## 2020-04-26 DIAGNOSIS — Z8249 Family history of ischemic heart disease and other diseases of the circulatory system: Secondary | ICD-10-CM | POA: Insufficient documentation

## 2020-04-26 DIAGNOSIS — N200 Calculus of kidney: Secondary | ICD-10-CM | POA: Diagnosis present

## 2020-04-26 DIAGNOSIS — Z90710 Acquired absence of both cervix and uterus: Secondary | ICD-10-CM | POA: Insufficient documentation

## 2020-04-26 DIAGNOSIS — Q211 Atrial septal defect: Secondary | ICD-10-CM | POA: Insufficient documentation

## 2020-04-26 DIAGNOSIS — Z8614 Personal history of Methicillin resistant Staphylococcus aureus infection: Secondary | ICD-10-CM | POA: Diagnosis not present

## 2020-04-26 DIAGNOSIS — I110 Hypertensive heart disease with heart failure: Secondary | ICD-10-CM | POA: Insufficient documentation

## 2020-04-26 DIAGNOSIS — J449 Chronic obstructive pulmonary disease, unspecified: Secondary | ICD-10-CM | POA: Diagnosis not present

## 2020-04-26 HISTORY — PX: NEPHROSTOMY TUBE REMOVAL: SHX6794

## 2020-04-26 HISTORY — PX: CYSTOSCOPY/URETEROSCOPY/HOLMIUM LASER/STENT PLACEMENT: SHX6546

## 2020-04-26 LAB — SARS CORONAVIRUS 2 (TAT 6-24 HRS): SARS Coronavirus 2: NEGATIVE

## 2020-04-26 SURGERY — CYSTOSCOPY/URETEROSCOPY/HOLMIUM LASER/STENT PLACEMENT
Anesthesia: General | Laterality: Right

## 2020-04-26 MED ORDER — ORAL CARE MOUTH RINSE: 15.0000 mL | Freq: Once | OROMUCOSAL | Status: AC

## 2020-04-26 MED ORDER — VANCOMYCIN HCL IN DEXTROSE 1-5 GM/200ML-% IV SOLN
INTRAVENOUS | Status: AC
  Administered 2020-04-26: 1000 mg via INTRAVENOUS
  Filled 2020-04-26: qty 200

## 2020-04-26 MED ORDER — ROCURONIUM BROMIDE 10 MG/ML (PF) SYRINGE
PREFILLED_SYRINGE | INTRAVENOUS | Status: AC
  Filled 2020-04-26: qty 10

## 2020-04-26 MED ORDER — LIDOCAINE HCL (CARDIAC) PF 100 MG/5ML IV SOSY
PREFILLED_SYRINGE | INTRAVENOUS | Status: DC | PRN
  Administered 2020-04-26: 60 mg via INTRAVENOUS

## 2020-04-26 MED ORDER — SODIUM CHLORIDE FLUSH 0.9 % IV SOLN
INTRAVENOUS | Status: AC
  Filled 2020-04-26: qty 10

## 2020-04-26 MED ORDER — EPHEDRINE 5 MG/ML INJ
INTRAVENOUS | Status: AC
  Filled 2020-04-26: qty 10

## 2020-04-26 MED ORDER — DEXAMETHASONE SODIUM PHOSPHATE 10 MG/ML IJ SOLN
INTRAMUSCULAR | Status: AC
  Filled 2020-04-26: qty 1

## 2020-04-26 MED ORDER — PROPOFOL 10 MG/ML IV BOLUS
INTRAVENOUS | Status: AC
  Filled 2020-04-26: qty 80

## 2020-04-26 MED ORDER — DEXAMETHASONE SODIUM PHOSPHATE 10 MG/ML IJ SOLN
INTRAMUSCULAR | Status: DC | PRN
  Administered 2020-04-26: 4 mg via INTRAVENOUS

## 2020-04-26 MED ORDER — LACTATED RINGERS IV SOLN: INTRAVENOUS | Status: DC

## 2020-04-26 MED ORDER — IOHEXOL 180 MG/ML  SOLN
INTRAMUSCULAR | Status: DC | PRN
  Administered 2020-04-26: 15 mL

## 2020-04-26 MED ORDER — HEPARIN SOD (PORK) LOCK FLUSH 100 UNIT/ML IV SOLN
INTRAVENOUS | Status: AC
  Filled 2020-04-26: qty 5

## 2020-04-26 MED ORDER — PHENYLEPHRINE HCL-NACL 10-0.9 MG/250ML-% IV SOLN
INTRAVENOUS | Status: DC | PRN
  Administered 2020-04-26: 25 ug/min via INTRAVENOUS

## 2020-04-26 MED ORDER — SULFAMETHOXAZOLE-TRIMETHOPRIM 800-160 MG PO TABS
1.0000 | ORAL_TABLET | Freq: Every day | ORAL | 0 refills | Status: DC
Start: 2020-04-26 — End: 2020-05-08

## 2020-04-26 MED ORDER — CHLORHEXIDINE GLUCONATE 0.12 % MT SOLN
OROMUCOSAL | Status: AC
  Administered 2020-04-26: 15 mL via OROMUCOSAL
  Filled 2020-04-26: qty 15

## 2020-04-26 MED ORDER — FENTANYL CITRATE (PF) 100 MCG/2ML IJ SOLN
INTRAMUSCULAR | Status: DC | PRN
  Administered 2020-04-26: 25 ug via INTRAVENOUS

## 2020-04-26 MED ORDER — ONDANSETRON HCL 4 MG/2ML IJ SOLN
INTRAMUSCULAR | Status: AC
  Filled 2020-04-26: qty 2

## 2020-04-26 MED ORDER — FENTANYL CITRATE (PF) 100 MCG/2ML IJ SOLN: 25.0000 ug | INTRAMUSCULAR | Status: DC | PRN

## 2020-04-26 MED ORDER — FENTANYL CITRATE (PF) 100 MCG/2ML IJ SOLN
INTRAMUSCULAR | Status: AC
  Filled 2020-04-26: qty 2

## 2020-04-26 MED ORDER — ONDANSETRON HCL 4 MG/2ML IJ SOLN
INTRAMUSCULAR | Status: DC | PRN
  Administered 2020-04-26: 4 mg via INTRAVENOUS

## 2020-04-26 MED ORDER — EPHEDRINE SULFATE 50 MG/ML IJ SOLN
INTRAMUSCULAR | Status: DC | PRN
  Administered 2020-04-26: 10 mg via INTRAVENOUS

## 2020-04-26 MED ORDER — SUGAMMADEX SODIUM 200 MG/2ML IV SOLN
INTRAVENOUS | Status: DC | PRN
  Administered 2020-04-26: 140 mg via INTRAVENOUS

## 2020-04-26 MED ORDER — CIPROFLOXACIN IN D5W 400 MG/200ML IV SOLN
400.0000 mg | INTRAVENOUS | Status: AC
  Administered 2020-04-26: 400 mg via INTRAVENOUS

## 2020-04-26 MED ORDER — ONDANSETRON HCL 4 MG/2ML IJ SOLN: 4.0000 mg | Freq: Once | INTRAMUSCULAR | Status: DC | PRN

## 2020-04-26 MED ORDER — LIDOCAINE HCL (PF) 2 % IJ SOLN
INTRAMUSCULAR | Status: AC
  Filled 2020-04-26: qty 5

## 2020-04-26 MED ORDER — CHLORHEXIDINE GLUCONATE 0.12 % MT SOLN: 15.0000 mL | Freq: Once | OROMUCOSAL | Status: AC

## 2020-04-26 MED ORDER — PHENYLEPHRINE HCL (PRESSORS) 10 MG/ML IV SOLN
INTRAVENOUS | Status: AC
  Filled 2020-04-26: qty 1

## 2020-04-26 MED ORDER — CIPROFLOXACIN IN D5W 400 MG/200ML IV SOLN
INTRAVENOUS | Status: AC
  Filled 2020-04-26: qty 200

## 2020-04-26 MED ORDER — PHENYLEPHRINE HCL (PRESSORS) 10 MG/ML IV SOLN
INTRAVENOUS | Status: DC | PRN
  Administered 2020-04-26 (×2): 100 ug via INTRAVENOUS
  Administered 2020-04-26 (×2): 200 ug via INTRAVENOUS

## 2020-04-26 MED ORDER — ROCURONIUM BROMIDE 100 MG/10ML IV SOLN
INTRAVENOUS | Status: DC | PRN
  Administered 2020-04-26: 40 mg via INTRAVENOUS

## 2020-04-26 MED ORDER — VANCOMYCIN HCL IN DEXTROSE 1-5 GM/200ML-% IV SOLN: 1000.0000 mg | INTRAVENOUS | Status: AC

## 2020-04-26 MED ORDER — PROPOFOL 10 MG/ML IV BOLUS
INTRAVENOUS | Status: DC | PRN
  Administered 2020-04-26: 50 mg via INTRAVENOUS
  Administered 2020-04-26: 20 mg via INTRAVENOUS

## 2020-04-26 SURGICAL SUPPLY — 36 items
BAG DRAIN CYSTO-URO LG1000N (MISCELLANEOUS) ×3 IMPLANT
BAG DRN RND TRDRP ANRFLXCHMBR (UROLOGICAL SUPPLIES) ×1
BAG URINE DRAIN 2000ML AR STRL (UROLOGICAL SUPPLIES) ×3 IMPLANT
BRUSH SCRUB EZ 1% IODOPHOR (MISCELLANEOUS) ×3 IMPLANT
CATH FOL 2WAY LX 18X30 (CATHETERS) ×3 IMPLANT
CATH URETL 5X70 OPEN END (CATHETERS) ×3 IMPLANT
CNTNR SPEC 2.5X3XGRAD LEK (MISCELLANEOUS)
CONT SPEC 4OZ STER OR WHT (MISCELLANEOUS)
CONT SPEC 4OZ STRL OR WHT (MISCELLANEOUS)
CONTAINER SPEC 2.5X3XGRAD LEK (MISCELLANEOUS) IMPLANT
DRAPE UTILITY 15X26 TOWEL STRL (DRAPES) ×3 IMPLANT
FIBER LASER TRAC TIP (UROLOGICAL SUPPLIES) ×3 IMPLANT
GLOVE BIOGEL PI IND STRL 7.5 (GLOVE) ×1 IMPLANT
GLOVE BIOGEL PI INDICATOR 7.5 (GLOVE) ×2
GOWN STRL REUS W/ TWL LRG LVL3 (GOWN DISPOSABLE) ×1 IMPLANT
GOWN STRL REUS W/ TWL XL LVL3 (GOWN DISPOSABLE) ×1 IMPLANT
GOWN STRL REUS W/TWL LRG LVL3 (GOWN DISPOSABLE) ×3
GOWN STRL REUS W/TWL XL LVL3 (GOWN DISPOSABLE) ×3
GUIDEWIRE STR DUAL SENSOR (WIRE) ×6 IMPLANT
HOLDER FOLEY CATH W/STRAP (MISCELLANEOUS) ×3 IMPLANT
INFUSOR MANOMETER BAG 3000ML (MISCELLANEOUS) ×3 IMPLANT
INTRODUCER DILATOR DOUBLE (INTRODUCER) ×3 IMPLANT
KIT TURNOVER CYSTO (KITS) ×3 IMPLANT
Navigator Ureteral Access Sheath ×3 IMPLANT
PACK CYSTO AR (MISCELLANEOUS) ×3 IMPLANT
SET CYSTO W/LG BORE CLAMP LF (SET/KITS/TRAYS/PACK) ×3 IMPLANT
SHEATH URETERAL 12FRX35CM (MISCELLANEOUS) IMPLANT
SOL .9 NS 3000ML IRR  AL (IV SOLUTION) ×2
SOL .9 NS 3000ML IRR AL (IV SOLUTION) ×1
SOL .9 NS 3000ML IRR UROMATIC (IV SOLUTION) ×1 IMPLANT
STENT URET 6FRX24 CONTOUR (STENTS) ×3 IMPLANT
STENT URET 6FRX26 CONTOUR (STENTS) IMPLANT
SURGILUBE 2OZ TUBE FLIPTOP (MISCELLANEOUS) ×3 IMPLANT
SYR 10ML LL (SYRINGE) ×3 IMPLANT
VALVE UROSEAL ADJ ENDO (VALVE) ×3 IMPLANT
WATER STERILE IRR 1000ML POUR (IV SOLUTION) ×3 IMPLANT

## 2020-04-26 NOTE — H&P (Signed)
   04/26/20 7:19 AM   Brittney Wood 09-Feb-1925 [id number]  HPI: 86 year old female with history of MRSA sepsis from urinary source and 1 cm right proximal ureteral stone that was managed initially with a nephrostomy tube and antibiotics.  She presents today for definitive management of her right-sided stone burden with ureteroscopy and laser lithotripsy, stent placement, nephrostomy tube removal.  Denies any fevers or chills.   PMH: Past Medical History:  Diagnosis Date  . Cancer ([place])    skin  . CHF (congestive heart failure) ([place])   . COPD (chronic obstructive pulmonary disease) ([place])   . Family history of adverse reaction to anesthesia    DAUGHTER-HARD TO WAKE UP  . History of methicillin resistant staphylococcus aureus (MRSA) 03/2020  . Hypertension   . Patent foramen ovale    MILD PER TEE REPORT DONE ON 04-03-20  . PICC (peripherally inserted central catheter) in place     Surgical History: Past Surgical History:  Procedure Laterality Date  . ABDOMINAL HYSTERECTOMY    . IR NEPHROSTOMY PLACEMENT RIGHT  03/29/2020  . JOINT REPLACEMENT    . TEE WITHOUT CARDIOVERSION N/A 04/02/2020   Procedure: TRANSESOPHAGEAL ECHOCARDIOGRAM (TEE);  Surgeon: Corey Skains, MD;  Location: [facility] ORS;  Service: Cardiovascular;  Laterality: N/A;    Family History: Family History  Problem Relation Age of Onset  . Hypertension Other     Social History:  reports that she has never smoked. She has never used smokeless tobacco. She reports that she does not drink alcohol and does not use drugs.  Physical Exam: BP (!) 160/85   Pulse 88   Resp 14   Wt 68.5 kg   SpO2 96%   BMI 27.62 kg/m    Constitutional:  Alert and oriented, No acute distress. Cardiovascular: Regular rate and rhythm Respiratory: Clear to auscultation bilaterally GI: Abdomen is soft, nontender, nondistended, no abdominal masses  Laboratory Data: Urine culture 7/27 negative  Assessment & Plan:   86 year old comorbid  female with history of MRSA urosepsis from obstructing right proximal ureteral stone that was initially managed with antibiotics and nephrostomy tube, presents today for definitive management with right ureteroscopy, laser lithotripsy, stent placement, nephrostomy tube removal.  We specifically discussed the risks ureteroscopy including bleeding, infection/sepsis, stent related symptoms including flank pain/urgency/frequency/incontinence/dysuria, ureteral injury, inability to access stone, or need for staged or additional procedures.  Right ureteroscopy, laser lithotripsy, stent placement, nephrostomy tube removal today   Brittney Madrid, MD 04/26/2020  [place] [address] [place], [place] [address] [phone]

## 2020-04-26 NOTE — OR Nursing (Signed)
Discharge pending RX from MD, as [facility] advises they will need in order to obtain from their pharmacy provider.  Daughter advises postop that she cannot pick up RX sent to [facility] for patient.

## 2020-04-26 NOTE — Progress Notes (Addendum)
   04/26/20 0800  Clinical Encounter Type  Visited With Family  Visit Type Initial  Referral From Chaplain  Consult/Referral To Chaplain  [place] rounding SDS waiting area, chaplain spoke with patient's daughter, checking to make sure she was ok while waiting. Patient's daughter said she was fine.

## 2020-04-26 NOTE — OR Nursing (Signed)
Dr. Diamantina Providence in earlier with RX for abx for [facility] - sent w/pt w/d/c instructions ([employer] transp rep) when patient discharged.

## 2020-04-26 NOTE — Anesthesia Preprocedure Evaluation (Addendum)
Anesthesia Evaluation   Patient awake    Reviewed: Allergy & Precautions, H&P , NPO status , Patient's Chart, lab work & pertinent test results, reviewed documented beta blocker date and time   Airway Mallampati: II  TM Distance: >3 FB Neck ROM: full    Dental  (+) Teeth Intact   Pulmonary COPD,  COPD inhaler,    Pulmonary exam normal        Cardiovascular Exercise Tolerance: Poor hypertension, On Medications and Pt. on medications +CHF  Normal cardiovascular exam Rhythm:regular Rate:Normal     Neuro/Psych PSYCHIATRIC DISORDERS Dementia negative neurological ROS     GI/Hepatic negative GI ROS, Neg liver ROS,   Endo/Other  negative endocrine ROS  Renal/GU Renal disease  negative genitourinary   Musculoskeletal   Abdominal   Peds  Hematology negative hematology ROS (+)   Anesthesia Other Findings Past Medical History: No date: Cancer ([place])     Comment:  skin No date: CHF (congestive heart failure) ([facility]) No date: COPD (chronic obstructive pulmonary disease) ([facility]) No date: Family history of adverse reaction to anesthesia     Comment:  DAUGHTER-HARD TO WAKE UP 03/2020: History of methicillin resistant staphylococcus aureus (MRSA) No date: Hypertension No date: Patent foramen ovale     Comment:  MILD PER TEE REPORT DONE ON 04-03-20 No date: PICC (peripherally inserted central catheter) in place Past Surgical History: No date: ABDOMINAL HYSTERECTOMY 03/29/2020: IR NEPHROSTOMY PLACEMENT RIGHT No date: JOINT REPLACEMENT 04/02/2020: TEE WITHOUT CARDIOVERSION; N/A     Comment:  Procedure: TRANSESOPHAGEAL ECHOCARDIOGRAM (TEE);                Surgeon: Corey Skains, MD;  Location: [facility] ORS;                Service: Cardiovascular;  Laterality: N/A; BMI    Body Mass Index: 27.62 kg/m     Reproductive/Obstetrics negative OB ROS                           Anesthesia Physical Anesthesia  Plan  ASA: IV  Anesthesia Plan: General ETT   Post-op Pain Management:    Induction:   PONV Risk Score and Plan: 4 or greater  Airway Management Planned:   Additional Equipment:   Intra-op Plan:   Post-operative Plan:   Informed Consent: I have reviewed the patients History and Physical, chart, labs and discussed the procedure including the risks, benefits and alternatives for the proposed anesthesia with the patient or authorized representative who has indicated his/her understanding and acceptance.     Dental Advisory Given  Plan Discussed with: CRNA  Anesthesia Plan Comments:        Anesthesia Quick Evaluation

## 2020-04-26 NOTE — Transfer of Care (Signed)
Immediate Anesthesia Transfer of Care Note  Patient: Brittney Wood  Procedure(s) Performed: CYSTOSCOPY/URETEROSCOPY/HOLMIUM LASER/STENT PLACEMENT (Right ) NEPHROSTOMY TUBE REMOVAL (Right )  Patient Location: PACU  Anesthesia Type:General  Level of Consciousness: awake, drowsy and patient cooperative  Airway & Oxygen Therapy: Patient Spontanous Breathing and Patient connected to face mask oxygen  Post-op Assessment: Report given to RN and Post -op Vital signs reviewed and stable  Post vital signs: Reviewed and stable  Last Vitals:  Vitals Value Taken Time  BP 148/67 04/26/20 0842  Temp    Pulse 74 04/26/20 0849  Resp 14 04/26/20 0849  SpO2 100 % 04/26/20 0849  Vitals shown include unvalidated device data.  Last Pain:  Vitals:   04/26/20 0842  PainSc: (P) 0-No pain         Complications: No complications documented.

## 2020-04-26 NOTE — Anesthesia Procedure Notes (Signed)
Procedure Name: Intubation Date/Time: 04/26/2020 7:43 AM Performed by: Lowry Bowl, CRNA Pre-anesthesia Checklist: Patient identified, Emergency Drugs available, Suction available and Patient being monitored Patient Re-evaluated:Patient Re-evaluated prior to induction Oxygen Delivery Method: Circle system utilized Preoxygenation: Pre-oxygenation with 100% oxygen Induction Type: IV induction and Cricoid Pressure applied Ventilation: Mask ventilation without difficulty and Oral airway inserted - appropriate to patient size Laryngoscope Size: Mac and 3 Grade View: Grade III Tube type: Oral Tube size: 6.5 mm Number of attempts: 1 Airway Equipment and Method: Stylet Placement Confirmation: ETT inserted through vocal cords under direct vision,  positive ETCO2 and breath sounds checked- equal and bilateral Secured at: 21 cm Tube secured with: Tape Dental Injury: Teeth and Oropharynx as per pre-operative assessment

## 2020-04-26 NOTE — Discharge Instructions (Addendum)
BACTRIM RX TO [facility] CX PER DR. Diamantina Providence.  WRITTEN RX FOR BACTRIM SENT WITH PATIENT (TRANSPORTATION REP) TO [place].   AMBULATORY SURGERY  DISCHARGE INSTRUCTIONS   1) The drugs that you were given will stay in your system until tomorrow so for the next 24 hours you should not:  A) Drive an automobile B) Make any legal decisions C) Drink any alcoholic beverage   2) You may resume regular meals tomorrow.  Today it is better to start with liquids and gradually work up to solid foods.  You may eat anything you prefer, but it is better to start with liquids, then soup and crackers, and gradually work up to solid foods.   3) Please notify your doctor immediately if you have any unusual bleeding, trouble breathing, redness and pain at the surgery site, drainage, fever, or pain not relieved by medication.    4) Additional Instructions:        Please contact your physician with any problems or Same Day Surgery at [phone], Monday through Friday 6 am to 4 pm, or [place] at [facility] number at [phone].

## 2020-04-26 NOTE — Op Note (Signed)
Date of procedure: 04/26/20  Preoperative diagnosis:  1. Right proximal ureteral and renal stones  Postoperative diagnosis:  1. Right renal stones  Procedure: 1. Cystoscopy, right retrograde pyelogram with intraoperative interpretation, right ureteral stent placement 2. Right ureteroscopy, laser lithotripsy of renal stones  Surgeon: [name], MD  Anesthesia: General  Complications: None  Intraoperative findings:  1.  Trabeculated bladder, no suspicious bladder lesions 2.  Nephrostomy tube did not appear to be located within the collecting system and had pulled out 3.  Uncomplicated dusting of right renal stones, no ureteral stones identified 4.  Uncomplicated stent placement  EBL: Minimal  Specimens: None  Drains: Right 6 French by 24 cm ureteral stent, 18 French Foley  Indication: Brittney Wood is a 86 y.o. patient with history of MRSA sepsis from urinary source with a 9 mm right proximal ureteral stone as well as a lower pole stones.  This was initially managed with a right-sided nephrostomy tube placement and prolonged hospitalization for antibiotics.  After reviewing the management options for treatment, they elected to proceed with the above surgical procedure(s). We have discussed the potential benefits and risks of the procedure, side effects of the proposed treatment, the likelihood of the patient achieving the goals of the procedure, and any potential problems that might occur during the procedure or recuperation. Informed consent has been obtained.  Description of procedure:  The patient was taken to the operating room and general anesthesia was induced. SCDs were placed for DVT prophylaxis. The patient was placed in the dorsal lithotomy position, prepped and draped in the usual sterile fashion, and preoperative antibiotics(Cipro and vancomycin) were administered. A preoperative time-out was performed.   On initial KUB, the right-sided nephrostomy tube appeared to  have pulled well away from the collecting system and was at least 3 to 4 cm from the kidney.  A 21 French rigid cystoscope was used to intubate the urethra and thorough cystoscopy was performed.  The bladder was significantly trabeculated, but there were no bladder lesions.  The bladder urine was slightly cloudy, and this was irrigated until clear.  I started by performing a retrograde pyelogram via a 5 French access catheter on the right side which showed no ureteral filling defects or hydronephrosis.  A wire was advanced easily up into the upper pole, a dual-lumen access catheter was used to gently dilate the ureter and a second safety wire was added.  A semirigid ureteroscope was advanced into the ureter and there were no stones noted in the distal or mid ureter.  A 12/14 French by 36 cm ureteral access sheath was advanced gently up into the kidney under fluoroscopic vision and passed easily.  A single channel digital flexible ureteroscope was advanced through the sheath and thorough pyeloscopy was performed.  This showed 2 stones in the renal pelvis, but no other abnormalities.  Using a 200 m laser fiber on settings of 0.5 J and 40 Hz, both stones were fragmented to dust.  Thorough pyeloscopy revealed no residual fragments.  Contrast was injected from the proximal ureter and showed no filling defects or extravasation.  The sheath was removed, and careful pullback ureteroscopy revealed no ureteral stones or ureteral injury.  The rigid cystoscope was backloaded over the wire, and a 6 [place] by 24 cm stent was uneventfully placed with an excellent curl in the upper pole, as well as under direct vision the bladder.  A 18 French Foley catheter was placed to maximize drainage in the postoperative period and reduce the  risk of infection in the setting of her prior nephrostomy tube appearing to be being pulled out.  The nephrostomy tube stitch was cut at the skin, the nephrostomy tube was cut, and the  nephrostomy tube was removed in its entirety.  Disposition: Stable to PACU  Plan: Maintain Foley for 1 week, follow-up in clinic in 1 week for Foley and stent removal Bactrim prophylaxis while stent and catheter in place  [name], MD

## 2020-04-29 ENCOUNTER — Encounter: Payer: Self-pay | Admitting: Urology

## 2020-04-30 NOTE — Progress Notes (Signed)
Contact was made with a family member. Patient resides at a rehab facility.

## 2020-05-01 ENCOUNTER — Encounter: Payer: Medicare Other | Admitting: Urology

## 2020-05-01 ENCOUNTER — Ambulatory Visit: Payer: Medicare Other | Admitting: Urology

## 2020-05-01 ENCOUNTER — Encounter: Payer: Self-pay | Admitting: Urology

## 2020-05-02 ENCOUNTER — Ambulatory Visit: Payer: Medicare Other | Admitting: Dermatology

## 2020-05-02 NOTE — Anesthesia Postprocedure Evaluation (Signed)
Anesthesia Post Note  Patient: Brittney Wood  Procedure(s) Performed: CYSTOSCOPY/URETEROSCOPY/HOLMIUM LASER/STENT PLACEMENT (Right ) NEPHROSTOMY TUBE REMOVAL (Right )  Patient location during evaluation: PACU Anesthesia Type: General Level of consciousness: awake and alert Pain management: pain level controlled Vital Signs Assessment: post-procedure vital signs reviewed and stable Respiratory status: spontaneous breathing, nonlabored ventilation, respiratory function stable and patient connected to nasal cannula oxygen Cardiovascular status: blood pressure returned to baseline and stable Postop Assessment: no apparent nausea or vomiting Anesthetic complications: no   No complications documented.   Last Vitals:  Vitals:   04/26/20 1003 04/26/20 1050  BP: (!) 167/68 (!) 176/66  Pulse: 68 75  Resp: 16 16  Temp: (!) 36.4 C   SpO2: 96% 96%    Last Pain:  Vitals:   04/26/20 1050  TempSrc:   PainSc: 0-No pain                 Molli Barrows

## 2020-05-08 ENCOUNTER — Encounter: Payer: Self-pay | Admitting: Urology

## 2020-05-08 ENCOUNTER — Ambulatory Visit (INDEPENDENT_AMBULATORY_CARE_PROVIDER_SITE_OTHER): Payer: Medicare Other | Admitting: Urology

## 2020-05-08 ENCOUNTER — Other Ambulatory Visit: Payer: Self-pay

## 2020-05-08 VITALS — BP 126/80 | HR 91 | Ht 62.0 in | Wt 152.0 lb

## 2020-05-08 DIAGNOSIS — N200 Calculus of kidney: Secondary | ICD-10-CM | POA: Diagnosis not present

## 2020-05-08 DIAGNOSIS — Z466 Encounter for fitting and adjustment of urinary device: Secondary | ICD-10-CM

## 2020-05-08 DIAGNOSIS — J8410 Pulmonary fibrosis, unspecified: Secondary | ICD-10-CM | POA: Insufficient documentation

## 2020-05-08 DIAGNOSIS — I38 Endocarditis, valve unspecified: Secondary | ICD-10-CM | POA: Insufficient documentation

## 2020-05-08 MED ORDER — SULFAMETHOXAZOLE-TRIMETHOPRIM 800-160 MG PO TABS
1.0000 | ORAL_TABLET | Freq: Once | ORAL | Status: AC
  Administered 2020-05-08: 1 via ORAL

## 2020-05-08 MED ORDER — LIDOCAINE HCL URETHRAL/MUCOSAL 2 % EX GEL
1.0000 "application " | Freq: Once | CUTANEOUS | Status: AC
  Administered 2020-05-08: 1 via URETHRAL

## 2020-05-08 NOTE — Progress Notes (Signed)
Cystoscopy Procedure Note:  Indication: Stent removal s/p 8/13 R URS/Laser lithotripsy/stent, removal of nephrostomy tube  Bactrim given, foley removed.  After informed consent and discussion of the procedure and its risks, Brittney Wood was positioned and prepped in the standard fashion. Cystoscopy was performed with a flexible cystoscope. The stent was grasped with flexible graspers and removed in its entirety. The patient tolerated the procedure well.  Findings: Uncomplicated stent removal  Assessment and Plan: Follow up as needed, return precautions discussed  Billey Co, MD 05/08/2020

## 2020-05-08 NOTE — Progress Notes (Signed)
Patient ID: Brittney Wood, female   DOB: [birth year], 86 y.o.   MRN: [id number] Catheter Removal  Patient is present today for a catheter removal.  24ml of water was drained from the balloon. A 16FR foley cath was removed from the bladder no complications were noted . Patient tolerated well.  Performed by: Edwin Dada, [place]

## 2020-05-08 NOTE — Addendum Note (Signed)
Addended by: Despina Hidden on: 05/08/2020 10:45 AM   Modules accepted: Orders

## 2020-05-08 NOTE — Addendum Note (Signed)
Addended by: Donalee Citrin on: 05/08/2020 10:44 AM   Modules accepted: Orders

## 2020-07-03 ENCOUNTER — Other Ambulatory Visit: Payer: Self-pay

## 2020-07-03 ENCOUNTER — Ambulatory Visit (INDEPENDENT_AMBULATORY_CARE_PROVIDER_SITE_OTHER): Payer: Medicare Other | Admitting: Dermatology

## 2020-07-03 DIAGNOSIS — L570 Actinic keratosis: Secondary | ICD-10-CM

## 2020-07-03 DIAGNOSIS — D692 Other nonthrombocytopenic purpura: Secondary | ICD-10-CM

## 2020-07-03 DIAGNOSIS — L820 Inflamed seborrheic keratosis: Secondary | ICD-10-CM | POA: Diagnosis not present

## 2020-07-03 DIAGNOSIS — L821 Other seborrheic keratosis: Secondary | ICD-10-CM

## 2020-07-03 DIAGNOSIS — L578 Other skin changes due to chronic exposure to nonionizing radiation: Secondary | ICD-10-CM | POA: Diagnosis not present

## 2020-07-03 NOTE — Progress Notes (Signed)
   Follow-Up Visit   Subjective  Brittney Wood is a 86 y.o. female who presents for the following: Actinic Keratosis (check the face, arms, and hands for new or persistent skin lesions).  The following portions of the chart were reviewed this encounter and updated as appropriate:  Tobacco  Allergies  Meds  Problems  Med Hx  Surg Hx  Fam Hx     Review of Systems:  No other skin or systemic complaints except as noted in HPI or Assessment and Plan.  Objective  Well appearing patient in no apparent distress; mood and affect are within normal limits.  A focused examination was performed including the face, arms, and hands . Relevant physical exam findings are noted in the Assessment and Plan.  Objective  Face (5): Erythematous thin papules/macules with gritty scale.   Objective  Arms and hands (4): Erythematous keratotic or waxy stuck-on papule or plaque.   Assessment & Plan  AK (actinic keratosis) (5) Face  Destruction of lesion - Face Complexity: simple   Destruction method: cryotherapy   Informed consent: discussed and consent obtained   Timeout:  patient name, date of birth, surgical site, and procedure verified Lesion destroyed using liquid nitrogen: Yes   Region frozen until ice ball extended beyond lesion: Yes   Outcome: patient tolerated procedure well with no complications   Post-procedure details: wound care instructions given    Inflamed seborrheic keratosis (4) Arms and hands  Destruction of lesion - Arms and hands Complexity: simple   Destruction method: cryotherapy   Informed consent: discussed and consent obtained   Timeout:  patient name, date of birth, surgical site, and procedure verified Lesion destroyed using liquid nitrogen: Yes   Region frozen until ice ball extended beyond lesion: Yes   Outcome: patient tolerated procedure well with no complications   Post-procedure details: wound care instructions given     Actinic Damage - diffuse scaly  erythematous macules with underlying dyspigmentation - Recommend daily broad spectrum sunscreen SPF 30+ to sun-exposed areas, reapply every 2 hours as needed.  - Call for new or changing lesions.  Purpura - Violaceous macules and patches - Benign - Related to age, sun damage and/or use of blood thinners - Observe - Can use OTC arnica containing moisturizer such as Dermend Bruise Formula if desired - Call for worsening or other concerns  Seborrheic Keratoses - Stuck-on, waxy, tan-brown papules and plaques  - Discussed benign etiology and prognosis. - Observe - Call for any changes  Return in about 6 months (around 01/01/2021) for AK f/u .  Luther Redo, CMA, am acting as scribe for Sarina Ser, MD .  Documentation: I have reviewed the above documentation for accuracy and completeness, and I agree with the above.  Sarina Ser, MD

## 2020-07-08 ENCOUNTER — Encounter: Payer: Self-pay | Admitting: Dermatology

## 2021-01-01 ENCOUNTER — Other Ambulatory Visit: Payer: Self-pay

## 2021-01-01 ENCOUNTER — Ambulatory Visit (INDEPENDENT_AMBULATORY_CARE_PROVIDER_SITE_OTHER): Payer: Medicare Other | Admitting: Dermatology

## 2021-01-01 DIAGNOSIS — L570 Actinic keratosis: Secondary | ICD-10-CM | POA: Diagnosis not present

## 2021-01-01 DIAGNOSIS — L821 Other seborrheic keratosis: Secondary | ICD-10-CM

## 2021-01-01 DIAGNOSIS — D692 Other nonthrombocytopenic purpura: Secondary | ICD-10-CM | POA: Diagnosis not present

## 2021-01-01 DIAGNOSIS — L578 Other skin changes due to chronic exposure to nonionizing radiation: Secondary | ICD-10-CM | POA: Diagnosis not present

## 2021-01-01 NOTE — Patient Instructions (Signed)

## 2021-01-01 NOTE — Progress Notes (Signed)
   Follow-Up Visit   Subjective  Brittney Wood is a 87 y.o. female who presents for the following: Actinic Keratosis (6 month follow up treated with LN2).  Accompanied by daughter  The following portions of the chart were reviewed this encounter and updated as appropriate:   Tobacco  Allergies  Meds  Problems  Med Hx  Surg Hx  Fam Hx     Review of Systems:  No other skin or systemic complaints except as noted in HPI or Assessment and Plan.  Objective  Well appearing patient in no apparent distress; mood and affect are within normal limits.  A focused examination was performed including face, hands. Relevant physical exam findings are noted in the Assessment and Plan.  Objective  Face (7): Erythematous thin papules/macules with gritty scale.    Assessment & Plan    Actinic Damage - chronic, secondary to cumulative UV radiation exposure/sun exposure over time - diffuse scaly erythematous macules with underlying dyspigmentation - Recommend daily broad spectrum sunscreen SPF 30+ to sun-exposed areas, reapply every 2 hours as needed.  - Recommend staying in the shade or wearing long sleeves, sun glasses (UVA+UVB protection) and wide brim hats (4-inch brim around the entire circumference of the hat). - Call for new or changing lesions.  Purpura - Chronic; persistent and recurrent.  Treatable, but not curable. - Violaceous macules and patches - Benign - Related to trauma, age, sun damage and/or use of blood thinners, chronic use of topical and/or oral steroids - Observe - Can use OTC arnica containing moisturizer such as Dermend Bruise Formula if desired - Call for worsening or other concerns  Seborrheic Keratoses - Stuck-on, waxy, tan-brown papules and/or plaques  - Benign-appearing - Discussed benign etiology and prognosis. - Observe - Call for any changes  AK (actinic keratosis) (7) Face  Destruction of lesion - Face Complexity: simple   Destruction method:  cryotherapy   Informed consent: discussed and consent obtained   Timeout:  patient name, date of birth, surgical site, and procedure verified Lesion destroyed using liquid nitrogen: Yes   Region frozen until ice ball extended beyond lesion: Yes   Outcome: patient tolerated procedure well with no complications   Post-procedure details: wound care instructions given    Return in about 6 months (around 07/03/2021).   I, Ashok Cordia, CMA, am acting as scribe for Sarina Ser, MD .  Documentation: I have reviewed the above documentation for accuracy and completeness, and I agree with the above.  Sarina Ser, MD

## 2021-01-03 ENCOUNTER — Encounter: Payer: Self-pay | Admitting: Dermatology

## 2021-01-27 ENCOUNTER — Emergency Department: Payer: Medicare Other

## 2021-01-27 ENCOUNTER — Inpatient Hospital Stay
Admission: EM | Admit: 2021-01-27 | Discharge: 2021-02-12 | DRG: 951 | Disposition: E | Payer: Medicare Other | Source: Skilled Nursing Facility | Attending: Internal Medicine | Admitting: Internal Medicine

## 2021-01-27 DIAGNOSIS — I5032 Chronic diastolic (congestive) heart failure: Secondary | ICD-10-CM | POA: Diagnosis present

## 2021-01-27 DIAGNOSIS — E872 Acidosis, unspecified: Secondary | ICD-10-CM

## 2021-01-27 DIAGNOSIS — R4182 Altered mental status, unspecified: Secondary | ICD-10-CM

## 2021-01-27 DIAGNOSIS — Z66 Do not resuscitate: Secondary | ICD-10-CM | POA: Diagnosis present

## 2021-01-27 DIAGNOSIS — G309 Alzheimer's disease, unspecified: Secondary | ICD-10-CM | POA: Diagnosis present

## 2021-01-27 DIAGNOSIS — U071 COVID-19: Secondary | ICD-10-CM | POA: Diagnosis present

## 2021-01-27 DIAGNOSIS — Z8614 Personal history of Methicillin resistant Staphylococcus aureus infection: Secondary | ICD-10-CM

## 2021-01-27 DIAGNOSIS — N179 Acute kidney failure, unspecified: Secondary | ICD-10-CM | POA: Diagnosis present

## 2021-01-27 DIAGNOSIS — I70229 Atherosclerosis of native arteries of extremities with rest pain, unspecified extremity: Secondary | ICD-10-CM

## 2021-01-27 DIAGNOSIS — Z7982 Long term (current) use of aspirin: Secondary | ICD-10-CM

## 2021-01-27 DIAGNOSIS — Z515 Encounter for palliative care: Principal | ICD-10-CM

## 2021-01-27 DIAGNOSIS — I110 Hypertensive heart disease with heart failure: Secondary | ICD-10-CM | POA: Diagnosis present

## 2021-01-27 DIAGNOSIS — I7090 Unspecified atherosclerosis: Secondary | ICD-10-CM

## 2021-01-27 DIAGNOSIS — I10 Essential (primary) hypertension: Secondary | ICD-10-CM | POA: Diagnosis present

## 2021-01-27 DIAGNOSIS — G9349 Other encephalopathy: Secondary | ICD-10-CM | POA: Diagnosis present

## 2021-01-27 DIAGNOSIS — J449 Chronic obstructive pulmonary disease, unspecified: Secondary | ICD-10-CM | POA: Diagnosis present

## 2021-01-27 DIAGNOSIS — E162 Hypoglycemia, unspecified: Secondary | ICD-10-CM | POA: Diagnosis present

## 2021-01-27 DIAGNOSIS — Z85828 Personal history of other malignant neoplasm of skin: Secondary | ICD-10-CM

## 2021-01-27 DIAGNOSIS — R6521 Severe sepsis with septic shock: Secondary | ICD-10-CM | POA: Diagnosis present

## 2021-01-27 DIAGNOSIS — F0390 Unspecified dementia without behavioral disturbance: Secondary | ICD-10-CM | POA: Diagnosis present

## 2021-01-27 DIAGNOSIS — N390 Urinary tract infection, site not specified: Secondary | ICD-10-CM | POA: Diagnosis present

## 2021-01-27 DIAGNOSIS — N200 Calculus of kidney: Secondary | ICD-10-CM | POA: Diagnosis present

## 2021-01-27 DIAGNOSIS — Z8249 Family history of ischemic heart disease and other diseases of the circulatory system: Secondary | ICD-10-CM

## 2021-01-27 DIAGNOSIS — Z79899 Other long term (current) drug therapy: Secondary | ICD-10-CM

## 2021-01-27 DIAGNOSIS — Z9981 Dependence on supplemental oxygen: Secondary | ICD-10-CM

## 2021-01-27 DIAGNOSIS — I70221 Atherosclerosis of native arteries of extremities with rest pain, right leg: Secondary | ICD-10-CM | POA: Diagnosis present

## 2021-01-27 DIAGNOSIS — A419 Sepsis, unspecified organism: Secondary | ICD-10-CM | POA: Diagnosis present

## 2021-01-27 DIAGNOSIS — F0150 Vascular dementia without behavioral disturbance: Secondary | ICD-10-CM | POA: Diagnosis present

## 2021-01-27 DIAGNOSIS — R9431 Abnormal electrocardiogram [ECG] [EKG]: Secondary | ICD-10-CM | POA: Diagnosis present

## 2021-01-27 DIAGNOSIS — E8809 Other disorders of plasma-protein metabolism, not elsewhere classified: Secondary | ICD-10-CM | POA: Diagnosis present

## 2021-01-27 DIAGNOSIS — F0280 Dementia in other diseases classified elsewhere without behavioral disturbance: Secondary | ICD-10-CM | POA: Diagnosis present

## 2021-01-27 DIAGNOSIS — J8410 Pulmonary fibrosis, unspecified: Secondary | ICD-10-CM | POA: Diagnosis present

## 2021-01-27 LAB — CBC WITH DIFFERENTIAL/PLATELET
Abs Immature Granulocytes: 0.11 10*3/uL — ABNORMAL HIGH (ref 0.00–0.07)
Basophils Absolute: 0 10*3/uL (ref 0.0–0.1)
Basophils Relative: 0 %
Eosinophils Absolute: 0 10*3/uL (ref 0.0–0.5)
Eosinophils Relative: 0 %
HCT: 37.9 % (ref 36.0–46.0)
Hemoglobin: 12.8 g/dL (ref 12.0–15.0)
Immature Granulocytes: 1 %
Lymphocytes Relative: 16 %
Lymphs Abs: 2.2 10*3/uL (ref 0.7–4.0)
MCH: 33.4 pg (ref 26.0–34.0)
MCHC: 33.8 g/dL (ref 30.0–36.0)
MCV: 99 fL (ref 80.0–100.0)
Monocytes Absolute: 0.8 10*3/uL (ref 0.1–1.0)
Monocytes Relative: 6 %
Neutro Abs: 10.7 10*3/uL — ABNORMAL HIGH (ref 1.7–7.7)
Neutrophils Relative %: 77 %
Platelets: 166 10*3/uL (ref 150–400)
RBC: 3.83 MIL/uL — ABNORMAL LOW (ref 3.87–5.11)
RDW: 16.7 % — ABNORMAL HIGH (ref 11.5–15.5)
WBC: 13.8 10*3/uL — ABNORMAL HIGH (ref 4.0–10.5)
nRBC: 0 % (ref 0.0–0.2)

## 2021-01-27 LAB — COMPREHENSIVE METABOLIC PANEL
ALT: 15 U/L (ref 0–44)
AST: 28 U/L (ref 15–41)
Albumin: 1.6 g/dL — ABNORMAL LOW (ref 3.5–5.0)
Alkaline Phosphatase: 108 U/L (ref 38–126)
Anion gap: 13 (ref 5–15)
BUN: 51 mg/dL — ABNORMAL HIGH (ref 8–23)
CO2: 16 mmol/L — ABNORMAL LOW (ref 22–32)
Calcium: 7.9 mg/dL — ABNORMAL LOW (ref 8.9–10.3)
Chloride: 114 mmol/L — ABNORMAL HIGH (ref 98–111)
Creatinine, Ser: 2.46 mg/dL — ABNORMAL HIGH (ref 0.44–1.00)
GFR, Estimated: 18 mL/min — ABNORMAL LOW (ref >60)
Glucose, Bld: 60 mg/dL — ABNORMAL LOW (ref 70–99)
Potassium: 4 mmol/L (ref 3.5–5.1)
Sodium: 143 mmol/L (ref 135–145)
Total Bilirubin: 0.7 mg/dL (ref 0.3–1.2)
Total Protein: 5.6 g/dL — ABNORMAL LOW (ref 6.5–8.1)

## 2021-01-27 MED ORDER — VANCOMYCIN HCL IN DEXTROSE 1-5 GM/200ML-% IV SOLN
1000.0000 mg | Freq: Once | INTRAVENOUS | Status: AC
  Administered 2021-01-27: 1000 mg via INTRAVENOUS
  Filled 2021-01-27: qty 200

## 2021-01-27 MED ORDER — LACTATED RINGERS IV BOLUS
1000.0000 mL | Freq: Once | INTRAVENOUS | Status: AC
  Administered 2021-01-27: 1000 mL via INTRAVENOUS

## 2021-01-27 MED ORDER — SODIUM CHLORIDE 0.9 % IV SOLN
2.0000 g | Freq: Once | INTRAVENOUS | Status: AC
  Administered 2021-01-28: 2 g via INTRAVENOUS
  Filled 2021-01-27 (×2): qty 2

## 2021-01-27 NOTE — ED Triage Notes (Signed)
Pt a dnr , hypotensive

## 2021-01-27 NOTE — ED Provider Notes (Signed)
[facility] Emergency Department Provider Note  ____________________________________________   I have reviewed the triage vital signs and the nursing notes.   HISTORY  Chief Complaint Altered Mental Status (X 1 day , from [facility] , )   History limited by and level 5 caveat due to: AMS  HPI Brittney Wood is a 87 y.o. female who presents to the emergency department today from living facility because of concerns for decreased responsiveness.  Per EMS report staff just noticed this today.  However in discussion with the patient's daughter it sounds like she has noticed that the patient has been acting somewhat abnormal over the past couple of days.  The daughter has noticed that the patient has seemed to be favoring her right side.  When EMS arrived they found the patient to be hypotensive.  Patient herself is unable to give any history.  Records reviewed. Per medical record review patient has a history of CHF, COPD.   Past Medical History:  Diagnosis Date  . Actinic keratosis   . Basal cell carcinoma 04/29/2015   R nose   . Cancer ([place])    skin  . CHF (congestive heart failure) ([place])   . COPD (chronic obstructive pulmonary disease) ([place])   . Family history of adverse reaction to anesthesia    DAUGHTER-HARD TO WAKE UP  . History of methicillin resistant staphylococcus aureus (MRSA) 03/2020  . Hypertension   . Patent foramen ovale    MILD PER TEE REPORT DONE ON 04-03-20  . PICC (peripherally inserted central catheter) in place     Patient Active Problem List   Diagnosis Date Noted  . Pulmonary fibrosis ([place]) 05/08/2020  . Valvular heart disease 05/08/2020  . Right ureteral stone 04/09/2020  . Sepsis due to methicillin resistant Staphylococcus aureus (MRSA) with encephalopathy without septic shock ([place])   . Hydronephrosis of right kidney   . Acute respiratory failure with hypoxia ([place])   . Essential hypertension   . MRSA bacteremia 03/31/2020   . Nephrolithiasis 03/31/2020  . Dementia without behavioral disturbance ([place]) 03/31/2020  . Severe sepsis ([place]) 03/29/2020  . Bilateral leg edema 12/13/2017  . 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 COPD exacerbation ([place]) 07/06/2015  . Mixed hyperlipidemia 12/31/2014  . Chronic diastolic CHF (congestive heart failure), NYHA class 3 ([place]) 07/02/2014  . SOB (shortness of breath) 02/26/2014  . Low back pain 11/30/2013  . Osteoarthrosis involving lower leg 11/30/2013  . Osteoarthrosis, unspecified whether generalized or localized, pelvic region and thigh 11/30/2013    Past Surgical History:  Procedure Laterality Date  . ABDOMINAL HYSTERECTOMY    . CYSTOSCOPY/URETEROSCOPY/HOLMIUM LASER/STENT PLACEMENT Right 04/26/2020   Procedure: CYSTOSCOPY/URETEROSCOPY/HOLMIUM LASER/STENT PLACEMENT;  Surgeon: Billey Co, MD;  Location: [facility] ORS;  Service: Urology;  Laterality: Right;  . IR NEPHROSTOMY PLACEMENT RIGHT  03/29/2020  . JOINT REPLACEMENT    . NEPHROSTOMY TUBE REMOVAL Right 04/26/2020   Procedure: NEPHROSTOMY TUBE REMOVAL;  Surgeon: Billey Co, MD;  Location: [facility] ORS;  Service: Urology;  Laterality: Right;  . TEE WITHOUT CARDIOVERSION N/A 04/02/2020   Procedure: TRANSESOPHAGEAL ECHOCARDIOGRAM (TEE);  Surgeon: Corey Skains, MD;  Location: [facility] ORS;  Service: Cardiovascular;  Laterality: N/A;    Prior to Admission medications   Medication Sig Start Date End Date Taking? Authorizing Provider  acetaminophen (TYLENOL) 650 MG CR tablet Take 650 mg by mouth 3 (three) times daily.     [provider]  aspirin 81 MG EC tablet Take 81 mg  by mouth daily.     [provider]  donepezil (ARICEPT) 10 MG tablet Take 10 mg by mouth at bedtime.    [provider]  latanoprost (XALATAN) 0.005 % ophthalmic solution Place 1 drop into both eyes at bedtime.  03/13/20   [provider]  losartan (COZAAR) 50 MG tablet Take 1 tablet (50 mg  total) by mouth daily. 04/04/20   Loletha Grayer, MD  memantine (NAMENDA) 10 MG tablet Take 10 mg by mouth 2 (two) times daily.    [provider]  montelukast (SINGULAIR) 10 MG tablet Take 10 mg by mouth every morning.     [provider]  omeprazole (PRILOSEC) 20 MG capsule Take 20 mg by mouth daily.    [provider]  polyethylene glycol (MIRALAX / GLYCOLAX) 17 g packet Take 17 g by mouth daily as needed for moderate constipation. 04/03/20   Loletha Grayer, MD    Allergies Buprenorphine hcl and Morphine and related  Family History  Problem Relation Age of Onset  . Hypertension Other     Social History Social History   Tobacco Use  . Smoking status: Never Smoker  . Smokeless tobacco: Never Used  Substance Use Topics  . Alcohol use: No  . Drug use: No    Review of Systems Unable to obtain secondary to altered mental status.  ____________________________________________   PHYSICAL EXAM:  VITAL SIGNS: ED Triage Vitals  Enc Vitals Group     BP 01/23/2021 2158 (!) 77/44     Pulse Rate 01/31/2021 2155 100     Resp 01/30/2021 2155 17     Temp 01/19/2021 2155 98.8 F (37.1 C)     Temp Source 02/09/2021 2155 Oral     SpO2 01/13/2021 2155 96 %     Weight 01/26/2021 2156 132 lb 4.4 oz (60 kg)     Height 02/11/2021 2156 5\' 6"  (1.676 m)     Head Circumference --      Peak Flow --      Pain Score 01/19/2021 2156 5   Constitutional: Responsive to verbal stimuli. Not oriented.  Eyes: Conjunctivae are normal.  ENT      Head: Normocephalic and atraumatic.      Nose: No congestion/rhinnorhea.      Mouth/Throat: Mucous membranes are moist.      Neck: No stridor. Hematological/Lymphatic/Immunilogical: No cervical lymphadenopathy. Cardiovascular: Tachycardic, regular rhythm.   Respiratory: Some rhonchi in bilateral lung bases.  Gastrointestinal: Soft and diffusely.   Genitourinary: Deferred Musculoskeletal: Right foot cold, pulseless, blue.  Neurologic:   Responsive to verbal stimuli. Able to answer yes/no questions.  Skin:  Skin is warm, dry and intact.   ____________________________________________    LABS (pertinent positives/negatives)  CBC wbc 13.8, hgb 12.8, plt 166 CMP na 143, k 4.0, glu 60, cr 2.46 Lactic 4.4  ____________________________________________   EKG  I, Nance Pear, attending physician, personally viewed and interpreted this EKG  EKG Time: 2211 Rate: 108 Rhythm: sinus tachycardia Axis: left axis deviation Intervals: qtc 467 QRS: IVCD ST changes: no st elevation Impression: abnormal ekg   ____________________________________________    RADIOLOGY  CXR No active cardiopulmonary disease  CT head No acute abnormality ____________________________________________   PROCEDURES  Procedures  Angiocath insertion Performed by: Nance Pear  Consent: Verbal consent obtained. Risks and benefits: risks, benefits and alternatives were discussed Time out: Immediately prior to procedure a "time out" was called to verify the correct patient, procedure, equipment, support staff and site/side marked as required.  Preparation: Patient was prepped and draped in the usual sterile fashion.  Vein Location: right distal upper arm  Ultrasound Guided  Gauge: 20  Normal blood return and flush without difficulty Patient tolerance: Patient tolerated the procedure well with no immediate complications.  CRITICAL CARE Performed by: Nance Pear   Total critical care time: 55 minutes  Critical care time was exclusive of separately billable procedures and treating other patients.  Critical care was necessary to treat or prevent imminent or life-threatening deterioration.  Critical care was time spent personally by me on the following activities: development of treatment plan with patient and/or surrogate as well as nursing, discussions with consultants, evaluation of patient's response to treatment,  examination of patient, obtaining history from patient or surrogate, ordering and performing treatments and interventions, ordering and review of laboratory studies, ordering and review of radiographic studies, pulse oximetry and re-evaluation of patient's condition.   ____________________________________________   INITIAL IMPRESSION / ASSESSMENT AND PLAN / ED COURSE  Pertinent labs & imaging results that were available during my care of the patient were reviewed by me and considered in my medical decision making (see chart for details).   Patient presents to the emergency department today because of concerns for decreased responsiveness.  On exam patient is responsive to verbal stimuli however only can really answer yes or no questions.  She was found to be extremely hypotensive here.  I did have concerns for possible sepsis.  Additionally the patient's right foot appears to lack arterial flow at this time.  Patient's blood work here is concerning for elevated lactate.  She also has elevated white count.  Creatinine is elevated.  At this point unable to obtain CT angio given elevated creatinine although again I do think likely patient has an arterial clot.  Unclear if its simply thrombus versus septic emboli. Discussed with Dr. Lucky Cowboy with vascular surgery who did not think patient would be a candidate for procedure at this time given septic and unstable picture.   I did have a discussion with patient's family about patient's condition, both septic concern as well as right foot.  I did confirm the patient is a DNR and DNI with family.  I did discuss extremely guarded prognosis and potential for terminal illness.    ____________________________________________   FINAL CLINICAL IMPRESSION(S) / ED DIAGNOSES  Final diagnoses:  Altered mental status, unspecified altered mental status type  AKI (acute kidney injury) ([place])  Arterial occlusion     Note: This dictation was prepared with Dragon  dictation. Any transcriptional errors that result from this process are unintentional     Nance Pear, MD 01-29-2021 [id number]

## 2021-01-27 NOTE — ED Triage Notes (Signed)
Per staff at [facility] , pt no acting herself, x 1 day, pt altered

## 2021-01-27 NOTE — ED Provider Notes (Incomplete)
[facility] Emergency Department Provider Note  {** REMINDER - THIS NOTE IS NOT A FINAL MEDICAL RECORD UNTIL IT IS SIGNED.  UNTIL THEN, THE CONTENT BELOW MAY REFLECT INFORMATION FROM A DOCUMENTATION TEMPLATE, NOT THE ACTUAL PATIENT VISIT. **}  ____________________________________________   I have reviewed the triage vital signs and the nursing notes.   HISTORY  Chief Complaint Altered Mental Status (X 1 day , from [facility] , )   History limited by: {Name/None:[id number]::"Language [place] utilized","Altered Mental Status","Dementia","Intoxication","Not cooperative","Not Limited"}   HPI Brittney Wood is a 87 y.o. female who presents to the emergency department today ***   LOCATION: *** DURATION: *** TIMING: *** SEVERITY: *** QUALITY: *** CONTEXT: *** MODIFYING FACTORS: *** ASSOCIATED SYMPTOMS: ***  Records reviewed. Per medical record review patient has a history of ***  Past Medical History:  Diagnosis Date  . Actinic keratosis   . Basal cell carcinoma 04/29/2015   R nose   . Cancer ([place])    skin  . CHF (congestive heart failure) ([place])   . COPD (chronic obstructive pulmonary disease) ([place])   . Family history of adverse reaction to anesthesia    DAUGHTER-HARD TO WAKE UP  . History of methicillin resistant staphylococcus aureus (MRSA) 03/2020  . Hypertension   . Patent foramen ovale    MILD PER TEE REPORT DONE ON 04-03-20  . PICC (peripherally inserted central catheter) in place     Patient Active Problem List   Diagnosis Date Noted  . Pulmonary fibrosis ([place]) 05/08/2020  . Valvular heart disease 05/08/2020  . Right ureteral stone 04/09/2020  . Sepsis due to methicillin resistant Staphylococcus aureus (MRSA) with encephalopathy without septic shock ([place])   . Hydronephrosis of right kidney   . Acute respiratory failure with hypoxia ([place])   . Essential hypertension   . MRSA bacteremia 03/31/2020  .  Nephrolithiasis 03/31/2020  . Dementia without behavioral disturbance ([place]) 03/31/2020  . Severe sepsis ([place]) 03/29/2020  . Bilateral leg edema 12/13/2017  . 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 COPD exacerbation ([place]) 07/06/2015  . Mixed hyperlipidemia 12/31/2014  . Chronic diastolic CHF (congestive heart failure), NYHA class 3 ([place]) 07/02/2014  . SOB (shortness of breath) 02/26/2014  . Low back pain 11/30/2013  . Osteoarthrosis involving lower leg 11/30/2013  . Osteoarthrosis, unspecified whether generalized or localized, pelvic region and thigh 11/30/2013    Past Surgical History:  Procedure Laterality Date  . ABDOMINAL HYSTERECTOMY    . CYSTOSCOPY/URETEROSCOPY/HOLMIUM LASER/STENT PLACEMENT Right 04/26/2020   Procedure: CYSTOSCOPY/URETEROSCOPY/HOLMIUM LASER/STENT PLACEMENT;  Surgeon: Billey Co, MD;  Location: [facility] ORS;  Service: Urology;  Laterality: Right;  . IR NEPHROSTOMY PLACEMENT RIGHT  03/29/2020  . JOINT REPLACEMENT    . NEPHROSTOMY TUBE REMOVAL Right 04/26/2020   Procedure: NEPHROSTOMY TUBE REMOVAL;  Surgeon: Billey Co, MD;  Location: [facility] ORS;  Service: Urology;  Laterality: Right;  . TEE WITHOUT CARDIOVERSION N/A 04/02/2020   Procedure: TRANSESOPHAGEAL ECHOCARDIOGRAM (TEE);  Surgeon: Corey Skains, MD;  Location: [facility] ORS;  Service: Cardiovascular;  Laterality: N/A;    Prior to Admission medications   Medication Sig Start Date End Date Taking? Authorizing Provider  acetaminophen (TYLENOL) 650 MG CR tablet Take 650 mg by mouth 3 (three) times daily.     [provider]  aspirin 81 MG EC tablet Take 81 mg by mouth daily.     [provider]  donepezil (ARICEPT) 10 MG tablet Take 10 mg by mouth at bedtime.  [provider]  latanoprost (XALATAN) 0.005 % ophthalmic solution Place 1 drop into both eyes at bedtime.  03/13/20   [provider]  losartan (COZAAR) 50 MG tablet Take 1 tablet (50 mg total) by  mouth daily. 04/04/20   Loletha Grayer, MD  memantine (NAMENDA) 10 MG tablet Take 10 mg by mouth 2 (two) times daily.    [provider]  montelukast (SINGULAIR) 10 MG tablet Take 10 mg by mouth every morning.     [provider]  omeprazole (PRILOSEC) 20 MG capsule Take 20 mg by mouth daily.    [provider]  polyethylene glycol (MIRALAX / GLYCOLAX) 17 g packet Take 17 g by mouth daily as needed for moderate constipation. 04/03/20   Loletha Grayer, MD    Allergies Buprenorphine hcl and Morphine and related  Family History  Problem Relation Age of Onset  . Hypertension Other     Social History Social History   Tobacco Use  . Smoking status: Never Smoker  . Smokeless tobacco: Never Used  Substance Use Topics  . Alcohol use: No  . Drug use: No    Review of Systems Constitutional: No fever/chills Eyes: No visual changes. ENT: No sore throat. Cardiovascular: Denies chest pain. Respiratory: Denies shortness of breath. Gastrointestinal: No abdominal pain.  No nausea, no vomiting.  No diarrhea.   Genitourinary: Negative for dysuria. Musculoskeletal: Negative for back pain. Skin: Negative for rash. Neurological: Negative for headaches, focal weakness or numbness.  ____________________________________________   PHYSICAL EXAM:  VITAL SIGNS: ED Triage Vitals  Enc Vitals Group     BP 02/06/2021 2158 (!) 77/44     Pulse Rate 02/11/2021 2155 100     Resp 01/26/2021 2155 17     Temp 01/19/2021 2155 98.8 F (37.1 C)     Temp Source 02/11/2021 2155 Oral     SpO2 01/29/2021 2155 96 %     Weight 01/22/2021 2156 132 lb 4.4 oz (60 kg)     Height 01/29/2021 2156 5\' 6"  (1.676 m)     Head Circumference --      Peak Flow --      Pain Score 02/09/2021 2156 5     Pain Loc --      Pain Edu? --      Excl. in [place]? --      Constitutional: Alert and oriented.  Eyes: Conjunctivae are normal.  ENT      Head: Normocephalic and atraumatic.      Nose: No  congestion/rhinnorhea.      Mouth/Throat: Mucous membranes are moist.      Neck: No stridor. Hematological/Lymphatic/Immunilogical: No cervical lymphadenopathy. Cardiovascular: Normal rate, regular rhythm.  No murmurs, rubs, or gallops. *** Respiratory: Normal respiratory effort without tachypnea nor retractions. Breath sounds are clear and equal bilaterally. No wheezes/rales/rhonchi. Gastrointestinal: Soft and non tender. No rebound. No guarding.  Genitourinary: Deferred Musculoskeletal: Normal range of motion in all extremities. No lower extremity edema. Neurologic:  Normal speech and language. No gross focal neurologic deficits are appreciated.  Skin:  Skin is warm, dry and intact. No rash noted. Psychiatric: Mood and affect are normal. Speech and behavior are normal. Patient exhibits appropriate insight and judgment.  ____________________________________________    LABS (pertinent positives/negatives)  CBC wbc 13.8, hgb 12.8, plt 166 CMP na 143, k 4.0, glu 60, cr 2.46  ____________________________________________   EKG  I, Nance Pear, attending physician, personally viewed and interpreted this EKG  EKG Time: 2211 Rate: 108 Rhythm: sinus tachycardia  Axis: left axis deviation Intervals: qtc 467 QRS: IVCD ST changes: no st elevation Impression: abnormal ekg   ____________________________________________    RADIOLOGY  CXR No active cardiopulmonary disease  ____________________________________________   PROCEDURES  Procedures  Angiocath insertion Performed by: Nance Pear  Consent: Verbal consent obtained. Risks and benefits: risks, benefits and alternatives were discussed Time out: Immediately prior to procedure a "time out" was called to verify the correct patient, procedure, equipment, support staff and site/side marked as required.  Preparation: Patient was prepped and draped in the usual sterile fashion.  Vein Location: right distal upper  arm  Ultrasound Guided  Gauge: 20  Normal blood return and flush without difficulty Patient tolerance: Patient tolerated the procedure well with no immediate complications.   ____________________________________________   INITIAL IMPRESSION / ASSESSMENT AND PLAN / ED COURSE  Pertinent labs & imaging results that were available during my care of the patient were reviewed by me and considered in my medical decision making (see chart for details).   ***  Discussed with patient/family results of testing/physical exam, differential plan and return precautions.  ____________________________________________   FINAL CLINICAL IMPRESSION(S) / ED DIAGNOSES  Final diagnoses:  None     Note: This dictation was prepared with Dragon dictation. Any transcriptional errors that result from this process are unintentional

## 2021-01-28 ENCOUNTER — Other Ambulatory Visit: Payer: Self-pay

## 2021-01-28 DIAGNOSIS — Z85828 Personal history of other malignant neoplasm of skin: Secondary | ICD-10-CM | POA: Diagnosis not present

## 2021-01-28 DIAGNOSIS — G309 Alzheimer's disease, unspecified: Secondary | ICD-10-CM | POA: Diagnosis present

## 2021-01-28 DIAGNOSIS — N179 Acute kidney failure, unspecified: Secondary | ICD-10-CM | POA: Diagnosis present

## 2021-01-28 DIAGNOSIS — J449 Chronic obstructive pulmonary disease, unspecified: Secondary | ICD-10-CM | POA: Diagnosis present

## 2021-01-28 DIAGNOSIS — R6521 Severe sepsis with septic shock: Secondary | ICD-10-CM | POA: Diagnosis present

## 2021-01-28 DIAGNOSIS — E872 Acidosis, unspecified: Secondary | ICD-10-CM

## 2021-01-28 DIAGNOSIS — Z66 Do not resuscitate: Secondary | ICD-10-CM | POA: Diagnosis present

## 2021-01-28 DIAGNOSIS — I70229 Atherosclerosis of native arteries of extremities with rest pain, unspecified extremity: Secondary | ICD-10-CM

## 2021-01-28 DIAGNOSIS — G9349 Other encephalopathy: Secondary | ICD-10-CM | POA: Diagnosis present

## 2021-01-28 DIAGNOSIS — F0280 Dementia in other diseases classified elsewhere without behavioral disturbance: Secondary | ICD-10-CM | POA: Diagnosis present

## 2021-01-28 DIAGNOSIS — U071 COVID-19: Secondary | ICD-10-CM

## 2021-01-28 DIAGNOSIS — Z9981 Dependence on supplemental oxygen: Secondary | ICD-10-CM | POA: Diagnosis not present

## 2021-01-28 DIAGNOSIS — N200 Calculus of kidney: Secondary | ICD-10-CM | POA: Diagnosis present

## 2021-01-28 DIAGNOSIS — Z515 Encounter for palliative care: Secondary | ICD-10-CM

## 2021-01-28 DIAGNOSIS — F0150 Vascular dementia without behavioral disturbance: Secondary | ICD-10-CM | POA: Diagnosis present

## 2021-01-28 DIAGNOSIS — E162 Hypoglycemia, unspecified: Secondary | ICD-10-CM | POA: Diagnosis present

## 2021-01-28 DIAGNOSIS — E8809 Other disorders of plasma-protein metabolism, not elsewhere classified: Secondary | ICD-10-CM | POA: Diagnosis present

## 2021-01-28 DIAGNOSIS — A419 Sepsis, unspecified organism: Secondary | ICD-10-CM

## 2021-01-28 DIAGNOSIS — J8410 Pulmonary fibrosis, unspecified: Secondary | ICD-10-CM | POA: Diagnosis present

## 2021-01-28 DIAGNOSIS — I110 Hypertensive heart disease with heart failure: Secondary | ICD-10-CM | POA: Diagnosis present

## 2021-01-28 DIAGNOSIS — N390 Urinary tract infection, site not specified: Secondary | ICD-10-CM | POA: Diagnosis present

## 2021-01-28 DIAGNOSIS — Z8614 Personal history of Methicillin resistant Staphylococcus aureus infection: Secondary | ICD-10-CM | POA: Diagnosis not present

## 2021-01-28 DIAGNOSIS — I70221 Atherosclerosis of native arteries of extremities with rest pain, right leg: Secondary | ICD-10-CM | POA: Diagnosis present

## 2021-01-28 DIAGNOSIS — I5032 Chronic diastolic (congestive) heart failure: Secondary | ICD-10-CM | POA: Diagnosis present

## 2021-01-28 LAB — URINALYSIS, COMPLETE (UACMP) WITH MICROSCOPIC
Bacteria, UA: NONE SEEN
Bilirubin Urine: NEGATIVE
Glucose, UA: NEGATIVE mg/dL
Ketones, ur: 5 mg/dL — AB
Nitrite: NEGATIVE
Protein, ur: 100 mg/dL — AB
RBC / HPF: >50 RBC/hpf — ABNORMAL HIGH (ref 0–5)
Specific Gravity, Urine: 1.015 (ref 1.005–1.030)
Squamous Epithelial / HPF: NONE SEEN (ref 0–5)
WBC, UA: >50 WBC/hpf — ABNORMAL HIGH (ref 0–5)
pH: 6 (ref 5.0–8.0)

## 2021-01-28 LAB — RESP PANEL BY RT-PCR (FLU A&B, COVID) ARPGX2
Influenza A by PCR: NEGATIVE
Influenza B by PCR: NEGATIVE
SARS Coronavirus 2 by RT PCR: POSITIVE — AB

## 2021-01-28 LAB — LACTIC ACID, PLASMA: Lactic Acid, Venous: 4.4 mmol/L (ref 0.5–1.9)

## 2021-01-28 MED ORDER — HALOPERIDOL LACTATE 5 MG/ML IJ SOLN: 0.5000 mg | INTRAMUSCULAR | Status: DC | PRN

## 2021-01-28 MED ORDER — ONDANSETRON 4 MG PO TBDP
4.0000 mg | ORAL_TABLET | Freq: Four times a day (QID) | ORAL | Status: DC | PRN
  Filled 2021-01-28: qty 1

## 2021-01-28 MED ORDER — HALOPERIDOL LACTATE 2 MG/ML PO CONC
0.5000 mg | ORAL | Status: DC | PRN
  Filled 2021-01-28: qty 0.3

## 2021-01-28 MED ORDER — GLYCOPYRROLATE 0.2 MG/ML IJ SOLN
0.2000 mg | INTRAMUSCULAR | Status: DC | PRN
  Filled 2021-01-28: qty 1

## 2021-01-28 MED ORDER — ENOXAPARIN SODIUM 30 MG/0.3ML IJ SOSY
30.0000 mg | PREFILLED_SYRINGE | INTRAMUSCULAR | Status: DC
  Filled 2021-01-28: qty 0.3

## 2021-01-28 MED ORDER — DIPHENHYDRAMINE HCL 50 MG/ML IJ SOLN: 12.5000 mg | INTRAMUSCULAR | Status: DC | PRN

## 2021-01-28 MED ORDER — HALOPERIDOL 0.5 MG PO TABS
0.5000 mg | ORAL_TABLET | ORAL | Status: DC | PRN
  Filled 2021-01-28: qty 1

## 2021-01-28 MED ORDER — ACETAMINOPHEN 325 MG PO TABS: 650.0000 mg | ORAL_TABLET | Freq: Four times a day (QID) | ORAL | Status: DC | PRN

## 2021-01-28 MED ORDER — GLYCOPYRROLATE 1 MG PO TABS
1.0000 mg | ORAL_TABLET | ORAL | Status: DC | PRN
  Filled 2021-01-28: qty 1

## 2021-01-28 MED ORDER — LACTATED RINGERS IV BOLUS
1000.0000 mL | Freq: Once | INTRAVENOUS | Status: AC
  Administered 2021-01-28: 1000 mL via INTRAVENOUS

## 2021-01-28 MED ORDER — LORAZEPAM 2 MG/ML PO CONC: 1.0000 mg | ORAL | Status: DC | PRN

## 2021-01-28 MED ORDER — LACTATED RINGERS IV SOLN: INTRAVENOUS | Status: DC

## 2021-01-28 MED ORDER — ACETAMINOPHEN 650 MG RE SUPP: 650.0000 mg | Freq: Four times a day (QID) | RECTAL | Status: DC | PRN

## 2021-01-28 MED ORDER — ONDANSETRON HCL 4 MG/2ML IJ SOLN: 4.0000 mg | Freq: Four times a day (QID) | INTRAMUSCULAR | Status: DC | PRN

## 2021-01-28 MED ORDER — LORAZEPAM 1 MG PO TABS: 1.0000 mg | ORAL_TABLET | ORAL | Status: DC | PRN

## 2021-01-28 MED ORDER — LORAZEPAM 2 MG/ML IJ SOLN: 1.0000 mg | INTRAMUSCULAR | Status: DC | PRN

## 2021-01-28 MED ORDER — HYDROMORPHONE HCL 1 MG/ML IJ SOLN: 0.5000 mg | INTRAMUSCULAR | Status: DC | PRN

## 2021-01-28 MED ORDER — BIOTENE DRY MOUTH MT LIQD
15.0000 mL | OROMUCOSAL | Status: DC | PRN
  Filled 2021-01-28: qty 15

## 2021-01-28 MED ORDER — GLYCOPYRROLATE 0.2 MG/ML IJ SOLN
0.2000 mg | INTRAMUSCULAR | Status: DC | PRN
Start: 2021-01-28 — End: 2021-01-29
  Filled 2021-01-28: qty 1

## 2021-01-28 MED ORDER — MORPHINE SULFATE (PF) 2 MG/ML IV SOLN
2.0000 mg | Freq: Once | INTRAVENOUS | Status: AC
  Administered 2021-01-28: 2 mg via INTRAVENOUS
  Filled 2021-01-28: qty 1

## 2021-01-28 MED ORDER — SODIUM CHLORIDE 0.9 % IV SOLN: 1.0000 g | INTRAVENOUS | Status: DC

## 2021-01-28 MED ORDER — POLYVINYL ALCOHOL 1.4 % OP SOLN
1.0000 [drp] | Freq: Four times a day (QID) | OPHTHALMIC | Status: DC | PRN
  Filled 2021-01-28: qty 15

## 2021-01-31 LAB — URINE CULTURE: Culture: >=100000 — AB

## 2021-02-12 NOTE — ED Notes (Signed)
Unable to obtain blood, due to difficult stick and iv does not draw back. MD notified

## 2021-02-12 NOTE — ED Notes (Signed)
Pt taken off NRB and placed on [place] at this time

## 2021-02-12 NOTE — ED Provider Notes (Signed)
-----------------------------------------   12:43 AM on 2021/02/21 -----------------------------------------  Blood pressure (!) 81/44, pulse 82, temperature 98.1 F (36.7 C), temperature source Axillary, resp. rate 20, height 5\' 6"  (1.676 m), weight 60 kg, SpO2 97 %.  Assuming care from Dr. Archie Balboa.  In short, Brittney Wood is a 87 y.o. female with a chief complaint of Altered Mental Status (X 1 day , from [facility] , ) .  Refer to the original H&P for additional details.  The current plan of care is to follow-up CT head results and admit for sepsis with concern for ischemic extremity.  ----------------------------------------- 2:04 AM on Feb 21, 2021 -----------------------------------------  Patient had transient improvement in blood pressure following 2 L of IV fluids, is unfortunately once again developing worsening hypotension.  CT head is negative for acute process.  Unfortunately, patient is not a good candidate for pressors as this would worsen her lower extremity ischemia.  Prognosis is extremely poor and patient unlikely to survive this admission.  This was discussed with patient's daughters at bedside, who are interested in transitioning patient to comfort care. Case discussed with hospitalist, Dr. Damita Dunnings, at the bedside and we will admit patient with plan for comfort care.    Blake Divine, MD 21-Feb-2021 [phone]

## 2021-02-12 NOTE — H&P (Signed)
History and Physical    Brittney Wood [id number] DOB: 1925-05-21 DOA: 02/11/2021  PCP: Pcp, No   Patient coming from: Skilled nursing facility  I have personally briefly reviewed patient's old medical records in [place]  Chief Complaint: Altered mental status decreased responsiveness  HPI: Brittney Wood is a 87 y.o. female with medical history significant for With multiple medical problems including CHF, pulmonary fibrosis, HTN dementia and diastolic heart failure with history of  septic shock 03/2020 from UTI from an obstructive UPJ stone with placement of right nephrostomy tube, and who is currently being treated with oral antibiotics for UTI, who presents to the emergency room with decreased responsiveness for the past 2 days.  Found to be hypotensive with EMS.  Patient unable to contribute to the history. ED course: On arrival BP 77/44, pulse 100, temp 98.8 with O2 sat 96% on 2 L.  Blood work significant for leukocytosis of 14,000 with lactic acid of 4.4.  Creatinine 2.46, up from baseline of 0.81 with bicarb of 16.  COVID-positive.  Urinalysis not collected EKG, personally viewed and interpreted sinus tachycardia at 108 with no acute ST-T wave changes Imaging: Chest x-ray with no cardiopulmonary disease CT head no acute abnormality Abdominal CT not performed  During physical exam in the emergency room, patient was noted to have a blue cool right foot.  The ED provider Dr. Archie Balboa spoke with vascular surgery Dr. Lucky Cowboy who did not think patient would be a candidate for any intervention given septic and unstable picture.  ED provider spoke with family and they confirmed patient is DNR and DNI Patient received sepsis fluid bolus and did not maintain blood pressures with highs sustaining blood pressure systolic in the [id number].  ED provider Dr. Charna Archer again spoke with family about the risk of pressors and concern for worsening ischemia of suspected ischemic leg.  Family opted to decline  pressors. Dr. Charna Archer spoke to family about comfort care.  I subsequently went with history of suggest up to the bedside and spoke with 2 daughters present at the bedside Brittney Wood and Brittney Wood and they both agreed that comfort care measures would be appropriate.  Patient was subsequently admitted to the medical surge floor.  Family agreed to the sequential withdrawal of treatment based on response to current antibiotics and IV fluids but also agreeable to morphine and anxiety as part of comfort care.  Review of Systems: Unable to obtain due to altered mental status  Past Medical History:  Diagnosis Date  . Actinic keratosis   . Basal cell carcinoma 04/29/2015   R nose   . Cancer ([place])    skin  . CHF (congestive heart failure) ([place])   . COPD (chronic obstructive pulmonary disease) ([place])   . Family history of adverse reaction to anesthesia    DAUGHTER-HARD TO WAKE UP  . History of methicillin resistant staphylococcus aureus (MRSA) 03/2020  . Hypertension   . Patent foramen ovale    MILD PER TEE REPORT DONE ON 04-03-20  . PICC (peripherally inserted central catheter) in place     Past Surgical History:  Procedure Laterality Date  . ABDOMINAL HYSTERECTOMY    . CYSTOSCOPY/URETEROSCOPY/HOLMIUM LASER/STENT PLACEMENT Right 04/26/2020   Procedure: CYSTOSCOPY/URETEROSCOPY/HOLMIUM LASER/STENT PLACEMENT;  Surgeon: Billey Co, MD;  Location: [facility] ORS;  Service: Urology;  Laterality: Right;  . IR NEPHROSTOMY PLACEMENT RIGHT  03/29/2020  . JOINT REPLACEMENT    . NEPHROSTOMY TUBE REMOVAL Right 04/26/2020   Procedure: NEPHROSTOMY TUBE REMOVAL;  Surgeon:  Billey Co, MD;  Location: [facility] ORS;  Service: Urology;  Laterality: Right;  . TEE WITHOUT CARDIOVERSION N/A 04/02/2020   Procedure: TRANSESOPHAGEAL ECHOCARDIOGRAM (TEE);  Surgeon: Corey Skains, MD;  Location: [facility] ORS;  Service: Cardiovascular;  Laterality: N/A;     reports that she has never smoked. She has never used  smokeless tobacco. She reports that she does not drink alcohol and does not use drugs.  Allergies  Allergen Reactions  . Buprenorphine Hcl     Other reaction(s): Other (See Comments) Hallucinations  . Morphine And Related Other (See Comments)    Hallucinations    Family History  Problem Relation Age of Onset  . Hypertension Other       Prior to Admission medications   Medication Sig Start Date End Date Taking? Authorizing Provider  acetaminophen (TYLENOL) 650 MG CR tablet Take 650 mg by mouth 3 (three) times daily.     [provider]  aspirin 81 MG EC tablet Take 81 mg by mouth daily.     [provider]  donepezil (ARICEPT) 10 MG tablet Take 10 mg by mouth at bedtime.    [provider]  latanoprost (XALATAN) 0.005 % ophthalmic solution Place 1 drop into both eyes at bedtime.  03/13/20   [provider]  losartan (COZAAR) 50 MG tablet Take 1 tablet (50 mg total) by mouth daily. 04/04/20   Loletha Grayer, MD  memantine (NAMENDA) 10 MG tablet Take 10 mg by mouth 2 (two) times daily.    [provider]  montelukast (SINGULAIR) 10 MG tablet Take 10 mg by mouth every morning.     [provider]  omeprazole (PRILOSEC) 20 MG capsule Take 20 mg by mouth daily.    [provider]  polyethylene glycol (MIRALAX / GLYCOLAX) 17 g packet Take 17 g by mouth daily as needed for moderate constipation. 04/03/20   Loletha Grayer, MD    Physical Exam: Vitals:   01/16/2021 0024 02/07/2021 0100 01/23/2021 0124 02/11/2021 0245  BP: (!) 81/44 (!) 94/46 (!) 119/93 (!) 99/47  Pulse: 82 (!) 103 97 (!) 103  Resp: 20 20 (!) 24 (!) 22  Temp:      TempSrc:      SpO2: 97% 97% 97% 96%  Weight:      Height:         Vitals:   01/12/2021 0024 01/29/2021 0100 01/29/2021 0124 01/18/2021 0245  BP: (!) 81/44 (!) 94/46 (!) 119/93 (!) 99/47  Pulse: 82 (!) 103 97 (!) 103  Resp: 20 20 (!) 24 (!) 22  Temp:      TempSrc:      SpO2: 97% 97% 97% 96%  Weight:       Height:          Constitutional:  Unresponsive to name being called and gentle shaking.  Will spontaneously open eyes and readily fall back asleep  HEENT:      Head: Normocephalic and atraumatic.         Eyes: PERLA, EOMI, Conjunctivae are normal. Sclera is non-icteric.       Mouth/Throat: Mucous membranes are moist.       Neck: Supple with no signs of meningismus. Cardiovascular:  Tachycardic. No murmurs, gallops, or rubs.  Pulses not felt on right foot and leg is cool. No JVD. No LE edema Respiratory:  Mild tachypnea.Lungs sounds clear bilaterally. No wheezes, crackles, or rhonchi.  Gastrointestinal: Soft, non tender, and non distended with positive bowel sounds.  Genitourinary:  No CVA tenderness. Musculoskeletal: .  Cyanosis, coolness with decreased pulse right foot Neurologic:  Face is symmetric.  No gross focal neurologic deficits . Skin: Skin is warm, dry.  Right foot is cool to the touch no rash or ulcers Psychiatric: Unable to assess due to unresponsiveness   Labs on Admission: I have personally reviewed following labs and imaging studies  CBC: Recent Labs  Lab 01/24/2021 2208  WBC 13.8*  NEUTROABS 10.7*  HGB 12.8  HCT 37.9  MCV 99.0  PLT 630   Basic Metabolic Panel: Recent Labs  Lab 01/21/2021 2208  NA 143  K 4.0  CL 114*  CO2 16*  GLUCOSE 60*  BUN 51*  CREATININE 2.46*  CALCIUM 7.9*   GFR: Estimated Creatinine Clearance: 13.4 mL/min (A) (by C-G formula based on SCr of 2.46 mg/dL (H)). Liver Function Tests: Recent Labs  Lab 01/17/2021 2208  AST 28  ALT 15  ALKPHOS 108  BILITOT 0.7  PROT 5.6*  ALBUMIN 1.6*   No results for input(s): LIPASE, AMYLASE in the last 168 hours. No results for input(s): AMMONIA in the last 168 hours. Coagulation Profile: No results for input(s): INR, PROTIME in the last 168 hours. Cardiac Enzymes: No results for input(s): CKTOTAL, CKMB, CKMBINDEX, TROPONINI in the last 168 hours. BNP (last 3 results) No results for  input(s): PROBNP in the last 8760 hours. HbA1C: No results for input(s): HGBA1C in the last 72 hours. CBG: No results for input(s): GLUCAP in the last 168 hours. Lipid Profile: No results for input(s): CHOL, HDL, LDLCALC, TRIG, CHOLHDL, LDLDIRECT in the last 72 hours. Thyroid Function Tests: No results for input(s): TSH, T4TOTAL, FREET4, T3FREE, THYROIDAB in the last 72 hours. Anemia Panel: No results for input(s): VITAMINB12, FOLATE, FERRITIN, TIBC, IRON, RETICCTPCT in the last 72 hours. Urine analysis:    Component Value Date/Time   COLORURINE AMBER (A) 04/09/2020 1026   APPEARANCEUR HAZY (A) 04/09/2020 1026   APPEARANCEUR Clear 01/17/2014 1658   LABSPEC 1.025 04/09/2020 1026   LABSPEC 1.016 01/17/2014 1658   PHURINE 6.5 04/09/2020 1026   GLUCOSEU NEGATIVE 04/09/2020 1026   GLUCOSEU Negative 01/17/2014 1658   HGBUR LARGE (A) 04/09/2020 1026   BILIRUBINUR NEGATIVE 04/09/2020 1026   BILIRUBINUR Negative 01/17/2014 1658   KETONESUR NEGATIVE 04/09/2020 1026   PROTEINUR >300 (A) 04/09/2020 1026   NITRITE NEGATIVE 04/09/2020 1026   LEUKOCYTESUR MODERATE (A) 04/09/2020 1026   LEUKOCYTESUR Negative 01/17/2014 1658    Radiological Exams on Admission: CT Head Wo Contrast  Result Date: 08-Feb-2021 CLINICAL DATA:  Encephalopathy EXAM: CT HEAD WITHOUT CONTRAST TECHNIQUE: Contiguous axial images were obtained from the base of the skull through the vertex without intravenous contrast. COMPARISON:  None. FINDINGS: Brain: There is no mass, hemorrhage or extra-axial collection. There is generalized atrophy without lobar predilection. Hypodensity of the white matter is most commonly associated with chronic microvascular disease. Vascular: Atherosclerotic calcification of the internal carotid arteries at the skull base. No abnormal hyperdensity of the major intracranial arteries or dural venous sinuses. Skull: The visualized skull base, calvarium and extracranial soft tissues are normal.  Sinuses/Orbits: No fluid levels or advanced mucosal thickening of the visualized paranasal sinuses. No mastoid or middle ear effusion. The orbits are normal. IMPRESSION: Generalized atrophy and chronic microvascular ischemia without acute intracranial abnormality. Electronically Signed   By: Ulyses Jarred M.D.   On: Feb 08, 2021 00:28   DG Chest Port 1 View  Result Date: 01/15/2021 CLINICAL DATA:  Altered mental status. EXAM: PORTABLE CHEST 1 VIEW  COMPARISON:  March 30, 2020 FINDINGS: Mildly decreased lung volumes are seen with mild, chronic appearing increased lung markings noted. There is no evidence of acute infiltrate, pleural effusion or pneumothorax. The heart size and mediastinal contours are within normal limits. Degenerative changes seen involving both shoulders and throughout the thoracic spine. IMPRESSION: No active cardiopulmonary disease. Electronically Signed   By: Virgina Norfolk M.D.   On: 01/26/2021 22:42     Assessment/Plan 87 year old female with history of CHF, pulmonary fibrosis, HTN dementia and diastolic heart failure with history of  septic shock 03/2020 from UTI from an obstructive UPJ stone who just completed a course of oral antibiotics for UTI, presenting from SNF with decreased responsiveness   Found to be hypotensive with EMS.  Patient unable to contribute to the history.    Septic shock, with hx of septic shock 03/2020 from UTI/UPJ stone   Comfort measures , partial    UTI - Urinalysis and CT abdomen pelvis were not done from ED - Arrival BP 77/44 not responsive to fluid bolus.  Not placed on pressors due to limb ischemia - Extensive discussion with family by ED provider x2 as well as myself - Family interested in continued IV fluids for now and antibiotics to assess response but keep comfortable with morphine and Ativan as needed - Foley catheter - Morphine as needed pain, respiratory distress and Ativan as needed agitation as well as Haldol and other comfort care  meds    Critical lower limb ischemia ([place]) - Physical exam with cool blue right foot - ED provider spoke with Dr. Lucky Cowboy prior to placing patient DNR.  Please refer to ED provider note for details - Patient comfort care at this time  AKI with metabolic acidosis - Creatinine 2.46 up from baseline of 0.71 - Related to sepsis  COVID-19 positive - Patient not symptomatic for COVID and chest x-ray is clear - Airborne precautions  Other chronic medical conditions   Dementia without behavioral disturbance (HCC)   Essential hypertension   Chronic diastolic CHF (congestive heart failure), NYHA class 3 (HCC)   Mixed Alzheimer's and vascular dementia ([place])   Pulmonary fibrosis ([place])     DVT prophylaxis: Lovenox  Code Status: DNR Family Communication: Spoke with 2 sisters Brittney [place] and [place] at the bedside for an extended period of time however patient's guarded to poor prognosis.  They voiced understanding.  They elected to was moving towards comfort care and as such patient was not sent to ICU.  They were in agreement with this decision.  If patient does make a turnaround they may elect to continue treatment.  Please see HPI for further details regarding the conversation Disposition Plan: Back to previous home environment Consults called: none  Status:At the time of admission, it appears that the appropriate admission status for this patient is INPATIENT. This is judged to be reasonable and necessary in order to provide the required intensity of service to ensure the patient's safety given the presenting symptoms, physical exam findings, and initial radiographic and laboratory data in the context of their  Comorbid conditions.   Patient requires inpatient status due to high intensity of service, high risk for further deterioration and high frequency of surveillance required.   I certify that at the point of admission it is my clinical judgment that the patient will require inpatient hospital care  spanning beyond [place] MD [facility]     2021/01/30, 3:14 AM

## 2021-02-12 NOTE — Progress Notes (Signed)
Received a call from bedside RN regarding the patient losing her IV access.  Patient is in septic shock 2/2 to presumptive UTI, POA.  Spoke with her daughter  Peter Congo via phone.  The patient's daughter and family made decision for comfort care only.  Explained to family members via phone what it means to be comfort care.  All efforts directed at prolonging life will be discontinued and nature will take its course.  They verbalized that it is their wish to make their mother comfort care.      Comfort care measures applied.  Anticipate hospital death.

## 2021-02-12 NOTE — ED Notes (Signed)
Family on the phone with dr. [place] Crane at this time, decision to keep pt comfort care and not to progress with IV or NG to support BP at this time. Family denies any additional questions at this time.

## 2021-02-12 NOTE — ED Notes (Signed)
Pt asleep in bed. Family at bedside

## 2021-02-12 NOTE — ED Notes (Signed)
ED Provider at bedside. 

## 2021-02-12 NOTE — Progress Notes (Signed)
Chaplin provide prayer, grief support, spiritual support.

## 2021-02-12 NOTE — Progress Notes (Signed)
  Chaplain On-Call responded to Order Requisition which reads "End of Life".  Chaplain met four family members of the patient at bedside and provided support. They stated that they have no spiritual needs at this time.  Patient is non-responsive, and is receiving comfort care measures.  Chaplain Pollyann Samples M.Div., [facility]

## 2021-02-12 NOTE — ED Notes (Signed)
Dr. Damita Dunnings aware IV infiltrated

## 2021-02-12 NOTE — Progress Notes (Signed)
Patient expired at 2009. Noted no respiration, no heartbeat and  no pulse, verified by Mariane Baumgarten, RN. Family at bedside, MD notified, [facility] notified and  [facility] services notified.

## 2021-02-12 DEATH — deceased

## 2021-03-14 NOTE — Discharge Summary (Signed)
Death Summary  Brittney Wood FXT:[id number] DOB: 09-Feb-1925 DOA: Feb 03, 2021  PCP: Pcp, No  Admit date: 02/03/21 Date of Death:Feb 04, 2021 Time of Death: 12-08-07   History of present illness:  Brittney Wood is a 87 y.o. female with a history significant for With multiple medical problems including CHF, pulmonary fibrosis, HTN dementia and diastolic heart failure with history of Septic shock  from UTI from an obstructive UPJ stone with placement of right nephrostomy tube, and who was being treated with oral antibiotics for UTI, who presented to the emergency room with decreased responsiveness for the past 2 days.  Found to be hypotensive with EMS.  Patient was not able  contribute to the history.On arrival BP 77/44, pulse 100, temp 98.8 with O2 sat 96% on 2 L.  Blood work significant for leukocytosis of 14,000 with lactic acid of 4.4.  Creatinine 2.46, up from baseline of 0.81 with bicarb of 16.Patient was given fluid in ED but blood pressure did not respond. On admission, family decided to make patient comfort care . Comfort care measures were inititated .   Final Diagnoses:  1.   Septic Shock 2. UTI 3.comfort care measures 4. Critical lower limb ischemia 5. 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    Significant Diagnostic Studies: CT Head Wo Contrast  Result Date: 2021/02/04 CLINICAL DATA:  Encephalopathy EXAM: CT HEAD WITHOUT CONTRAST TECHNIQUE: Contiguous axial images were obtained from the base of the skull through the vertex without intravenous contrast.  COMPARISON:  None. FINDINGS: Brain: There is no mass, hemorrhage or extra-axial collection. There is generalized atrophy without lobar predilection. Hypodensity of the white matter is most commonly associated with chronic microvascular disease. Vascular: Atherosclerotic calcification of the internal carotid arteries at the skull base. No abnormal hyperdensity of the major intracranial arteries or dural venous sinuses. Skull: The visualized skull base, calvarium and extracranial soft tissues are normal. Sinuses/Orbits: No fluid levels or advanced mucosal thickening of the visualized paranasal sinuses. No mastoid or middle ear effusion. The orbits are normal. IMPRESSION: Generalized atrophy and chronic microvascular ischemia without acute intracranial abnormality. Electronically Signed   By: Ulyses Jarred M.D.   On: Feb 04, 2021 00:28   DG Chest Port 1 View  Result Date: 02/03/2021 CLINICAL DATA:  Altered mental status. EXAM: PORTABLE CHEST 1 VIEW COMPARISON:  March 30, 2020 FINDINGS: Mildly decreased lung volumes are seen with mild, chronic appearing increased lung markings noted. There is no evidence of acute infiltrate, pleural effusion or pneumothorax. The heart size and mediastinal contours are within normal limits. Degenerative changes seen involving both shoulders and throughout the thoracic spine. IMPRESSION: No active cardiopulmonary disease. Electronically Signed   By: Virgina Norfolk M.D.   On: 02-03-21 22:42    Microbiology: No results found for this or any previous visit (from the past 240 hour(s)).   Labs: Basic Metabolic Panel: No results for input(s): NA, K, CL, CO2, GLUCOSE, BUN, CREATININE, CALCIUM, MG, PHOS in the last 168 hours. Liver Function Tests: No results for input(s): AST, ALT, ALKPHOS, BILITOT, PROT, ALBUMIN in the last 168 hours. No results for input(s): LIPASE, AMYLASE in the last 168 hours. No results for input(s): AMMONIA in the last 168 hours. CBC: No results for  input(s): WBC, NEUTROABS, HGB, HCT, MCV,  PLT in the last 168 hours. Cardiac Enzymes: No results for input(s): CKTOTAL, CKMB, CKMBINDEX, TROPONINI in the last 168 hours. D-Dimer No results for input(s): DDIMER in the last 72 hours. BNP: Invalid input(s): POCBNP CBG: No results for input(s): GLUCAP in the last 168 hours. Anemia work up No results for input(s): VITAMINB12, FOLATE, FERRITIN, TIBC, IRON, RETICCTPCT in the last 72 hours. Urinalysis    Component Value Date/Time   COLORURINE YELLOW (A) 02/11/2021 2208   APPEARANCEUR TURBID (A) 01/13/2021 2208   APPEARANCEUR Clear 01/17/2014 1658   LABSPEC 1.015 01/30/2021 2208   LABSPEC 1.016 01/17/2014 1658   PHURINE 6.0 01/25/2021 2208   GLUCOSEU NEGATIVE 01/14/2021 2208   GLUCOSEU Negative 01/17/2014 1658   HGBUR LARGE (A) 01/21/2021 2208   BILIRUBINUR NEGATIVE 01/21/2021 2208   BILIRUBINUR Negative 01/17/2014 1658   KETONESUR 5 (A) 01/20/2021 2208   PROTEINUR 100 (A) 02/09/2021 2208   NITRITE NEGATIVE 01/23/2021 2208   LEUKOCYTESUR MODERATE (A) 02/05/2021 2208   LEUKOCYTESUR Negative 01/17/2014 1658   Sepsis Labs Invalid input(s): PROCALCITONIN,  WBC,  LACTICIDVEN     SIGNED:  Nolberto Hanlon, MD  [facility] 02/17/2021, 4:18 PM Pager   If 7PM-7AM, please contact night-coverage [email or web address] Password [email or web address]

## 2021-07-09 ENCOUNTER — Ambulatory Visit: Payer: Medicare Other | Admitting: Dermatology

## 2021-11-28 IMAGING — CT CT HEAD W/O CM
4 series · 16 of 47 positions shown, 18 images · non-contrast
Comparison: None.

CLINICAL DATA: Encephalopathy

EXAM:
CT HEAD WITHOUT CONTRAST
TECHNIQUE: Contiguous axial images were obtained from the base of the skull
through the vertex without intravenous contrast.

[Series 4: coronal soft tissue · coronal · 0.33mm/px · 3 of 64 slices shown]
[im 22/64  brain]
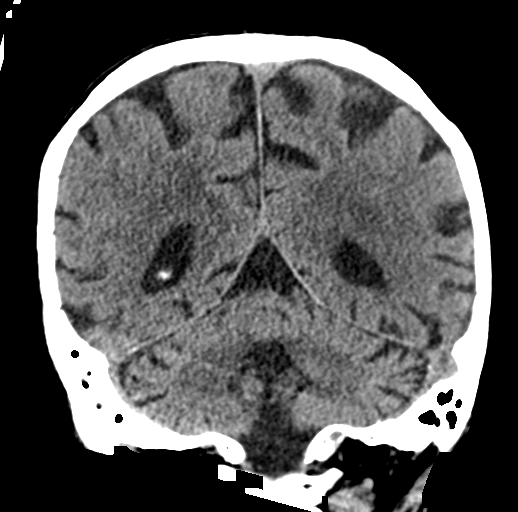
[im 29/64  brain]
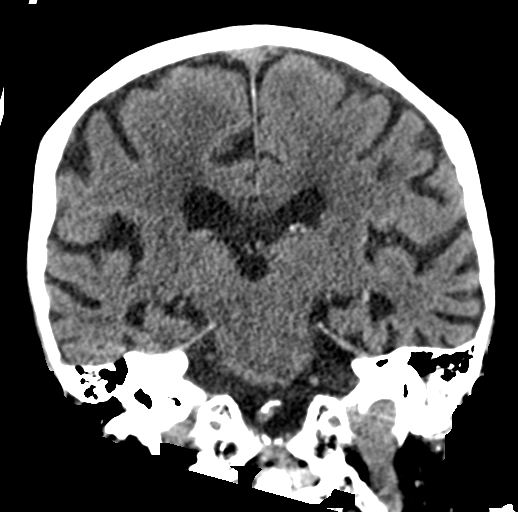
[im 36/64  brain]
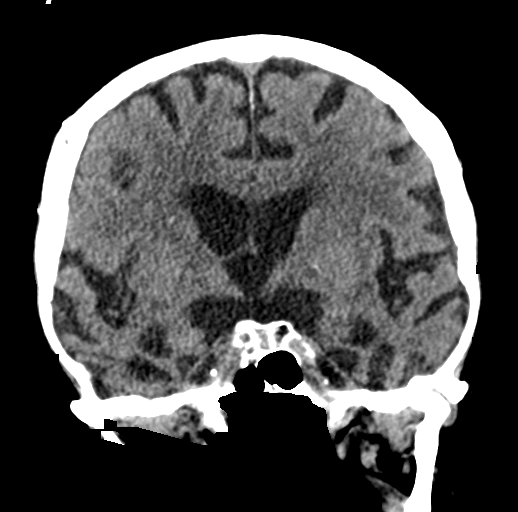

[Series 5: sagittal soft tissue · sagittal · 0.33mm/px · 3 of 57 slices shown]
[im 25/57  brain]
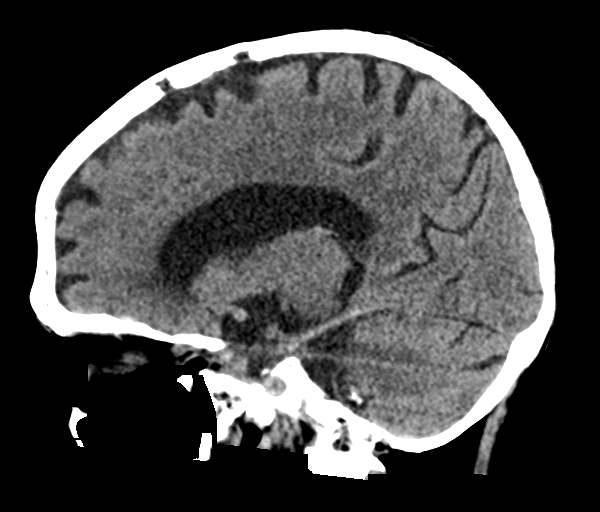
[im 29/57  brain]
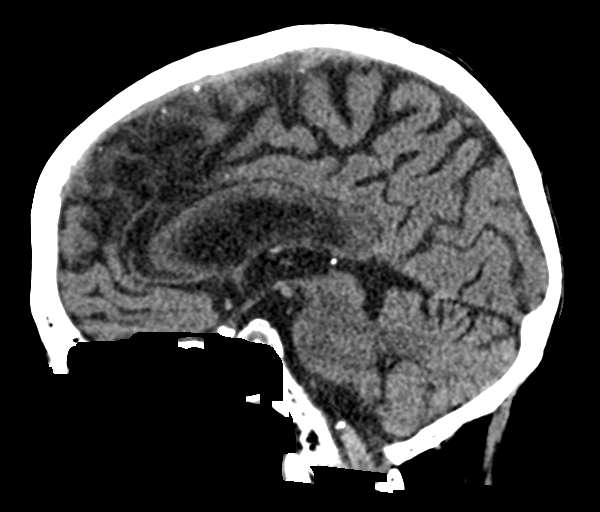
[im 32/57  brain]
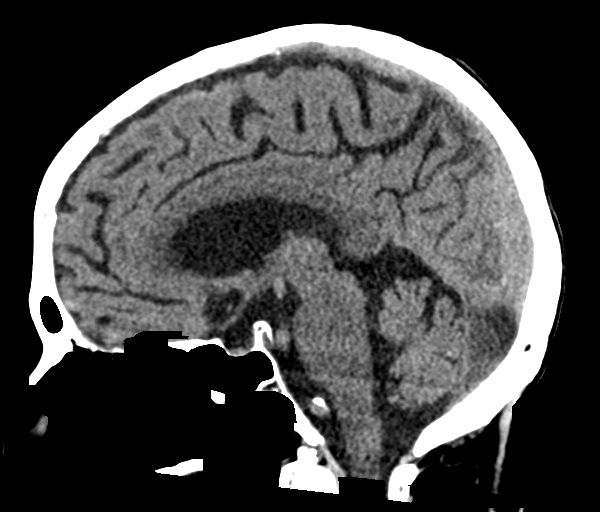

[Series 6: ax head wo · axial · 0.35mm/px · z∈[+459,+574]mm · 7 of 34 slices shown, 9 images]
[im 5/34  brain]
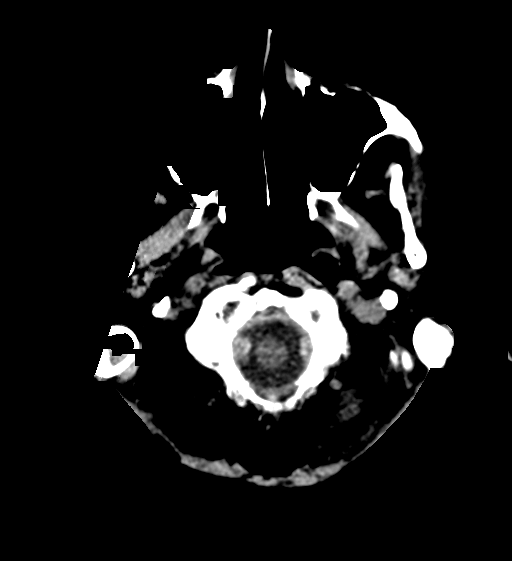
[im 5/34  bone]
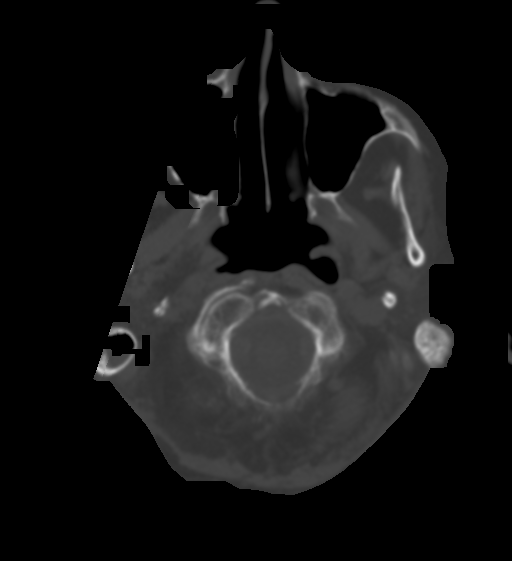
[im 9/34  brain]
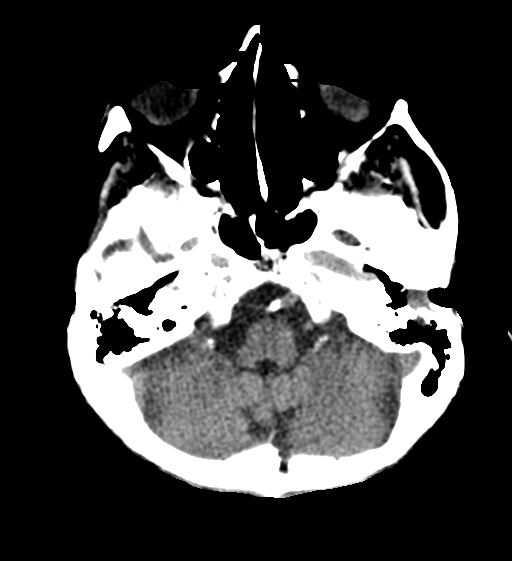
[im 13/34  brain]
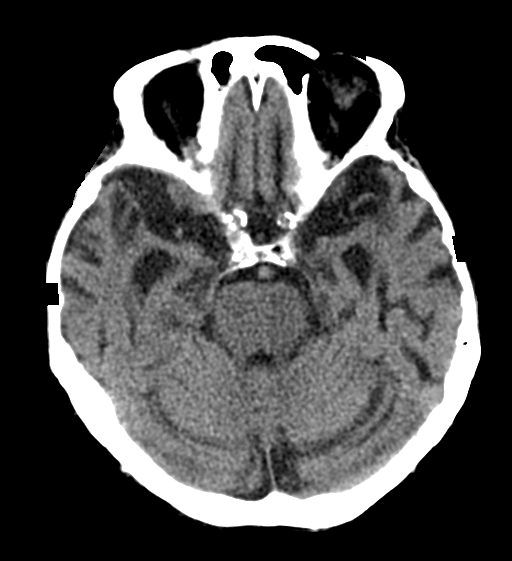
[im 17/34  brain]
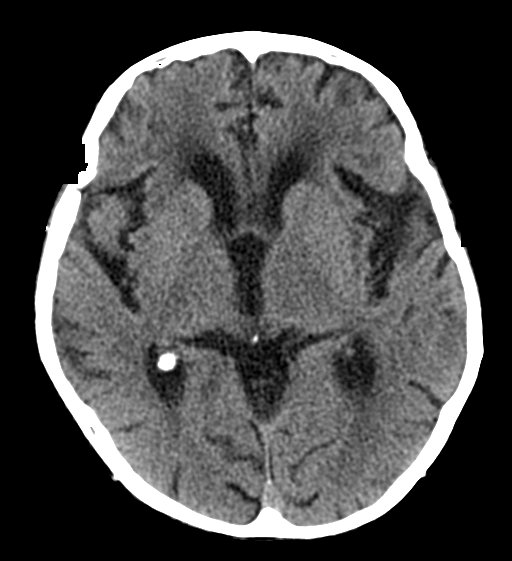
[im 21/34  brain]
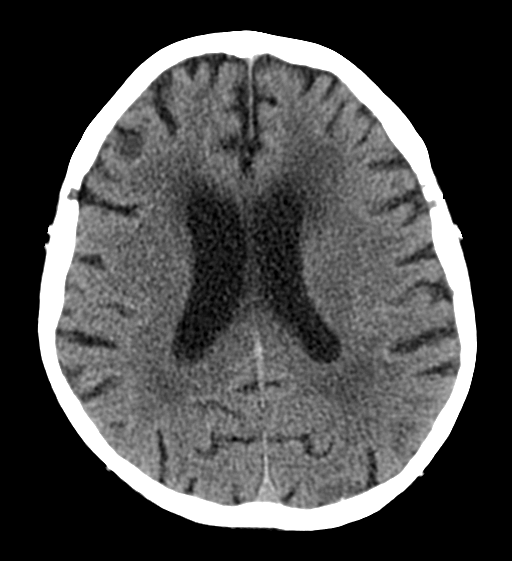
[im 21/34  bone]
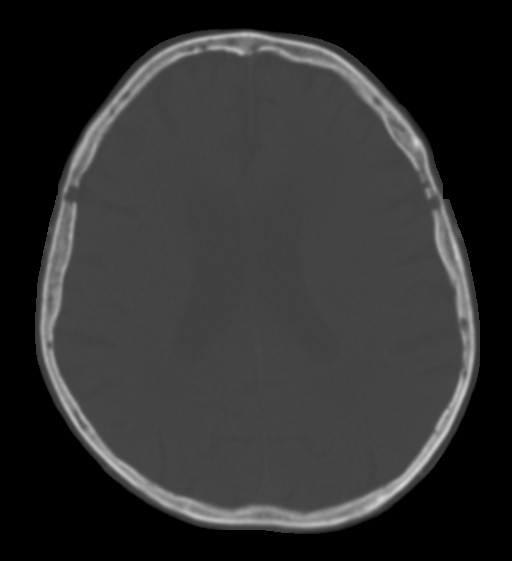
[im 25/34  brain]
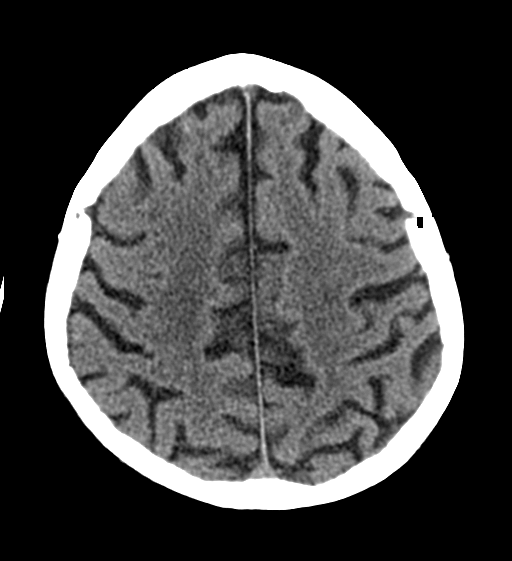
[im 29/34  brain]
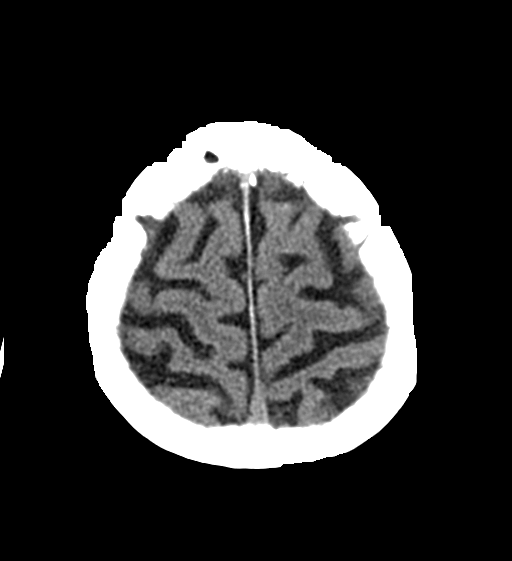

[Series 7: ax head bone · axial · 0.37mm/px · z∈[+449,+482]mm · 3 of 85 slices shown]
[im 9/85  bone]
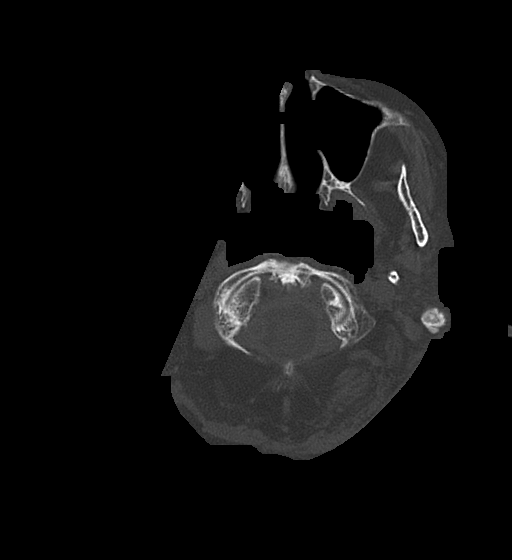
[im 17/85  bone]
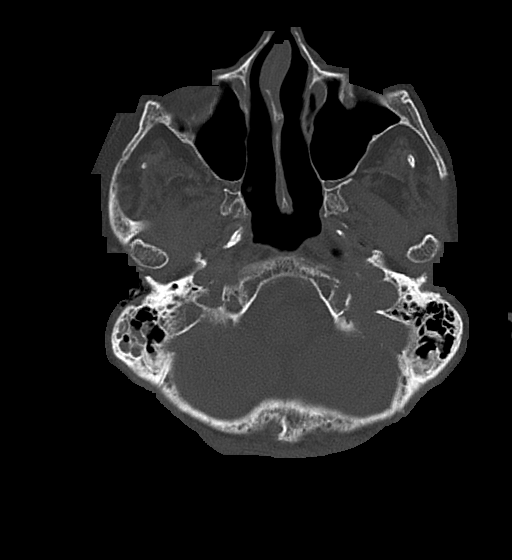
[im 26/85  bone]
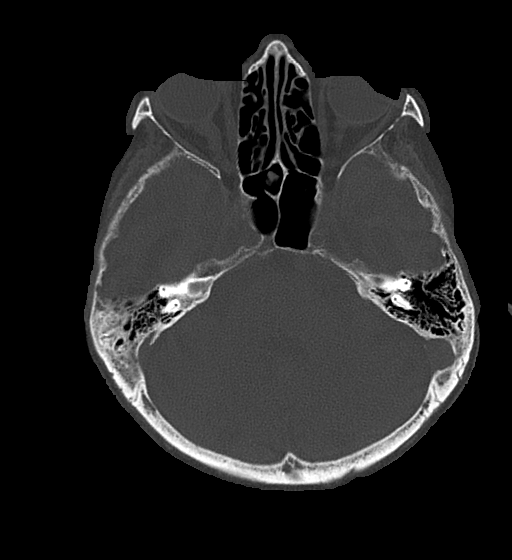

[16 of 47 positions shown; findings below may reference images not displayed]

FINDINGS: Brain: There is no mass, hemorrhage or extra-axial collection. There
is generalized atrophy without lobar predilection. Hypodensity of
the white matter is most commonly associated with chronic
microvascular disease.

Vascular: Atherosclerotic calcification of the internal carotid
arteries at the skull base. No abnormal hyperdensity of the major
intracranial arteries or dural venous sinuses.

Skull: The visualized skull base, calvarium and extracranial soft
tissues are normal.

Sinuses/Orbits: No fluid levels or advanced mucosal thickening of
the visualized paranasal sinuses. No mastoid or middle ear effusion.
The orbits are normal.
IMPRESSION: Generalized atrophy and chronic microvascular ischemia without acute
intracranial abnormality.
